# Patient Record
Sex: Female | Born: 1935 | Race: White | Hispanic: No | Marital: Married | Smoking: Never smoker
Health system: Southern US, Community
[De-identification: ages and names within clinical notes are randomized; demographics above are authoritative.]

## PROBLEM LIST (undated history)

## (undated) DIAGNOSIS — Z789 Other specified health status: Secondary | ICD-10-CM

## (undated) DIAGNOSIS — T8859XA Other complications of anesthesia, initial encounter: Secondary | ICD-10-CM

## (undated) DIAGNOSIS — C801 Malignant (primary) neoplasm, unspecified: Secondary | ICD-10-CM

## (undated) DIAGNOSIS — T4145XA Adverse effect of unspecified anesthetic, initial encounter: Secondary | ICD-10-CM

## (undated) DIAGNOSIS — I959 Hypotension, unspecified: Secondary | ICD-10-CM

## (undated) HISTORY — PX: EYE SURGERY: SHX253

## (undated) HISTORY — PX: COLONOSCOPY: SHX174

## (undated) HISTORY — DX: Hypotension, unspecified: I95.9

---

## 2005-09-18 ENCOUNTER — Ambulatory Visit: Payer: Self-pay | Admitting: Internal Medicine

## 2005-12-01 ENCOUNTER — Ambulatory Visit: Payer: Self-pay | Admitting: Gastroenterology

## 2006-11-30 ENCOUNTER — Ambulatory Visit: Payer: Self-pay | Admitting: Internal Medicine

## 2008-01-10 ENCOUNTER — Ambulatory Visit: Payer: Self-pay | Admitting: Internal Medicine

## 2009-01-07 ENCOUNTER — Ambulatory Visit: Payer: Self-pay | Admitting: Gastroenterology

## 2009-03-05 ENCOUNTER — Ambulatory Visit: Payer: Self-pay | Admitting: Internal Medicine

## 2010-03-06 ENCOUNTER — Ambulatory Visit: Payer: Self-pay | Admitting: Internal Medicine

## 2010-04-28 ENCOUNTER — Ambulatory Visit: Payer: Self-pay | Admitting: Gastroenterology

## 2010-04-29 ENCOUNTER — Ambulatory Visit: Payer: Self-pay | Admitting: Gastroenterology

## 2011-04-07 ENCOUNTER — Ambulatory Visit: Payer: Self-pay | Admitting: Internal Medicine

## 2012-05-18 ENCOUNTER — Ambulatory Visit: Payer: Self-pay | Admitting: Internal Medicine

## 2012-06-02 ENCOUNTER — Ambulatory Visit: Payer: Self-pay | Admitting: Obstetrics and Gynecology

## 2012-06-02 LAB — URINALYSIS, COMPLETE
Blood: NEGATIVE
Ketone: NEGATIVE
Nitrite: POSITIVE
Ph: 6 (ref 4.5–8.0)
Protein: 30
RBC,UR: 33 /HPF (ref 0–5)
WBC UR: 893 /HPF (ref 0–5)

## 2012-06-02 LAB — HEMOGLOBIN: HGB: 13.4 g/dL (ref 12.0–16.0)

## 2012-06-13 ENCOUNTER — Ambulatory Visit: Payer: Self-pay | Admitting: Obstetrics and Gynecology

## 2012-06-14 LAB — HEMATOCRIT: HCT: 36.7 % (ref 35.0–47.0)

## 2012-06-15 LAB — PATHOLOGY REPORT

## 2013-05-19 ENCOUNTER — Ambulatory Visit: Payer: Self-pay | Admitting: Internal Medicine

## 2014-06-06 ENCOUNTER — Encounter: Payer: Self-pay | Admitting: *Deleted

## 2014-06-07 ENCOUNTER — Ambulatory Visit: Payer: Self-pay | Admitting: Internal Medicine

## 2014-06-20 ENCOUNTER — Ambulatory Visit: Payer: Self-pay | Admitting: Ophthalmology

## 2014-07-03 ENCOUNTER — Ambulatory Visit: Payer: Self-pay | Admitting: Ophthalmology

## 2014-07-17 ENCOUNTER — Ambulatory Visit: Payer: Self-pay | Admitting: Ophthalmology

## 2015-01-01 NOTE — Op Note (Signed)
PATIENT NAME:  Katherine Medina, TIEDT MR#:  [id number] DATE OF BIRTH:  1929/05/23  DATE OF PROCEDURE:  06/13/2012  PREOPERATIVE DIAGNOSIS: Symptomatic pelvic organ prolapse.   POSTOPERATIVE DIAGNOSIS:  Symptomatic pelvic organ prolapse.   PROCEDURE: [place] colpocleisis.  SURGEON: Ricky L. Amalia Hailey, MD  ASSISTANT: Laverta Baltimore, MD  ANESTHESIA: General endotracheal.   FINDINGS: Pelvic organ prolapse, grossly normal tissue.   ESTIMATED BLOOD LOSS: 50 mL.   COMPLICATIONS: None.   DRAINS: Foley.   PROCEDURE IN DETAIL: The patient was placed in the supine position. Anesthesia was initiated. She was placed in the dorsal lithotomy position using Allen stirrups, prepped and draped in the usual sterile fashion. Cervix was visualized and grasped with a single-tooth tenaculum and everted. Sharpen curette was used to obtain minimal endometrial sampling, which was sent for permanent specimen.   Measurements were taken beginning with a portion of the epithelium to be removed toward the other vagina being measured to begin from 3 cm from the urethral orifice and then measured back to approximately 3 cm from the cervical orifice. This was repeated for total length of approximately 7.5 cm. This was reproduced posteriorly and marked. An approximately 3 cm gutter was allowed for marking along the sides once these trapezoidal areas were delineated the anterior vaginal epithelium was infused with approximately 15 mL of dilute vasopressin. This was completed in a similar fashion posterior. A scalpel was used to make a sharp incision next to the cervix anteriorly. Metzenbaum was used to bluntly dissect free the epithelium. This proceeded in a similar fashion posteriorly. A gutter was created with interrupted 3-0 Vicryl. Inversion of the cervix was begun by approximating the epithelium anterior and posteriorly. Colpocleisis was then carried out with successive mattress sutures of 3-0 Ethibond. The remaining portion of  the external vaginal epithelium was approximated with a running interlocking of 3-0 Vicryl. The gutters were seen to be maintained. Foley catheter was placed with good spillage of good clear urine.   Hemostasis was good. The patient tolerated the procedure well. After instrument, needle and counts were correct. Foley catheter will be left in place to plan to discontinue in the morning and upon spontaneous voiding will be discharged home with routine precautions and prescriptions.  ___________________________ Rockey Situ. Amalia Hailey, MD rle:slb D: 06/13/2012 13:16:08 ET T: 06/13/2012 13:38:27 ET JOB#: [id number]  cc: Ricky L. Amalia Hailey, MD, <Dictator> Selmer Dominion MD ELECTRONICALLY SIGNED 06/13/2012 15:05

## 2015-01-05 NOTE — Op Note (Signed)
PATIENT NAME:  Katherine Medina, Katherine Medina MR#:  [id number] DATE OF BIRTH:  1929-03-02  DATE OF PROCEDURE:  07/17/2014  PREOPERATIVE DIAGNOSIS:  Nuclear sclerotic cataract of the left eye.   POSTOPERATIVE DIAGNOSIS:  Nuclear sclerotic cataract of the left eye.   OPERATIVE PROCEDURE:  Cataract extraction by phacoemulsification with implant of intraocular lens to left eye.   SURGEON:  Birder Robson, MD.   ANESTHESIA:  1. Managed anesthesia care.  2. Topical tetracaine drops followed by 2% Xylocaine jelly applied in the preoperative holding area.   COMPLICATIONS:  None.   TECHNIQUE:   Stop and chop.   DESCRIPTION OF PROCEDURE:  The patient was examined and consented in the preoperative holding area where the aforementioned topical anesthesia was applied to the left eye and then brought back to the Operating Room where the left eye was prepped and draped in the usual sterile ophthalmic fashion and a lid speculum was placed. A paracentesis was created with the side port blade and the anterior chamber was filled with viscoelastic. A near clear corneal incision was performed with the steel keratome. A continuous curvilinear capsulorrhexis was performed with a cystotome followed by the capsulorrhexis forceps. Hydrodissection and hydrodelineation were carried out with BSS on a blunt cannula. The lens was removed in a stop and chop  technique and the remaining cortical material was removed with the irrigation-aspiration handpiece. The capsular bag was inflated with viscoelastic and the Tecnis ZCB00 25.5-diopter lens, serial number [id number] was placed in the capsular bag without complication. The remaining viscoelastic was removed from the eye with the irrigation-aspiration handpiece. The wounds were hydrated. The anterior chamber was flushed with Miostat and the eye was inflated to physiologic pressure. Vigamox 3:1 dilution was placed in the anterior chamber. The wounds were found to be water tight. The eye was  dressed with Vigamox. The patient was given protective glasses to wear throughout the day and a shield with which to sleep tonight. The patient was also given drops with which to begin a drop regimen today and will follow-up with me in one day.   PLEASE NOTE:  Cefuroxime was not placed within the eye due to penicillin allergy; rather, a 3:1 dilution of Vigamox was placed.     ____________________________ Livingston Diones. Tashayla Therien, MD wlp:jp D: 07/17/2014 21:12:39 ET T: 07/18/2014 08:38:38 ET JOB#: [id number]  cc: Manish Ruggiero L. Tomia Enlow, MD, <Dictator> Livingston Diones Brodric Schauer MD ELECTRONICALLY SIGNED 07/19/2014 16:57

## 2015-01-05 NOTE — Op Note (Signed)
PATIENT NAME:  Katherine Medina, Katherine Medina MR#:  [id number] DATE OF BIRTH:  [birth year]  DATE OF PROCEDURE:  07/03/2014  PREOPERATIVE DIAGNOSIS:  Visually significant cataract of the right eye.   POSTOPERATIVE DIAGNOSIS:  Visually significant cataract of the right eye.   OPERATIVE PROCEDURE:  Cataract extraction by phacoemulsification with implant of intraocular lens to right eye.   SURGEON:  Livingston Diones. Alfonzo Arca, MD   ANESTHESIA:  1. Managed anesthesia care.  2. Topical tetracaine drops followed by 2% Xylocaine jelly applied in the preoperative holding area.   COMPLICATIONS:  None.   TECHNIQUE:  Stop and chop.   DESCRIPTION OF PROCEDURE:  The patient was examined and consented in the preoperative holding area where the aforementioned topical anesthesia was applied to the right eye and then brought back to the Operating Room where the right eye was prepped and draped in the usual sterile ophthalmic fashion and a lid speculum was placed. A paracentesis was created with the side port blade and the anterior chamber was filled with viscoelastic. A near clear corneal incision was performed with the steel keratome. A continuous curvilinear capsulorrhexis was performed with a cystotome followed by the capsulorrhexis forceps. Hydrodissection and hydrodelineation were carried out with BSS on a blunt cannula. The lens was removed in a stop and chop technique and the remaining cortical material was removed with the irrigation-aspiration handpiece. The capsular bag was inflated with viscoelastic and the Tecnis ZCB00, 25.0-diopter lens, serial number [id number], was placed in the capsular bag without complication. The remaining viscoelastic was removed from the eye with the irrigation-aspiration handpiece. The wounds were hydrated. The anterior chamber was flushed with Miostat and the eye was inflated to physiologic pressure. Please note, cefuroxime was not used within the eye due to penicillin allergy; rather, a 4:1  dilution of Vigamox was placed in the anterior chamber. The wounds were found to be water tight. The eye was dressed with Vigamox. The patient was given protective glasses to wear throughout the day and a shield with which to sleep tonight. The patient was also given drops with which to begin a drop regimen today and will follow up with me in one day.   Also, please note that at the beginning of the case, 0.1 mL of epi-Shugarcaine was instilled into the anterior chamber due to poor dilation.    ____________________________ Livingston Diones Shakhia Gramajo, MD wlp:nb D: 07/03/2014 21:29:36 ET T: 07/04/2014 00:33:36 ET JOB#: [id number]  cc: Mavric Cortright L. Niels Cranshaw, MD, <Dictator> Livingston Diones Akaya Proffit MD ELECTRONICALLY SIGNED 07/05/2014 8:55

## 2016-03-31 ENCOUNTER — Encounter: Payer: Self-pay | Admitting: Emergency Medicine

## 2016-03-31 ENCOUNTER — Inpatient Hospital Stay
Admission: EM | Admit: 2016-03-31 | Discharge: 2016-04-03 | DRG: 337 | Disposition: A | Discharge status: Home / Self Care | Payer: Medicare Other | Attending: Surgery | Admitting: Surgery

## 2016-03-31 ENCOUNTER — Emergency Department: Payer: Medicare Other

## 2016-03-31 DIAGNOSIS — K353 Acute appendicitis with localized peritonitis, without perforation or gangrene: Secondary | ICD-10-CM

## 2016-03-31 DIAGNOSIS — K660 Peritoneal adhesions (postprocedural) (postinfection): Secondary | ICD-10-CM | POA: Diagnosis present

## 2016-03-31 DIAGNOSIS — K3532 Acute appendicitis with perforation and localized peritonitis, without abscess: Secondary | ICD-10-CM | POA: Diagnosis present

## 2016-03-31 LAB — CBC
HCT: 42.8 % (ref 35.0–47.0)
Hemoglobin: 14.2 g/dL (ref 12.0–16.0)
MCH: 28 pg (ref 26.0–34.0)
MCHC: 33.2 g/dL (ref 32.0–36.0)
MCV: 84.5 fL (ref 80.0–100.0)
PLATELETS: 204 10*3/uL (ref 150–440)
RBC: 5.07 MIL/uL (ref 3.80–5.20)
RDW: 14.2 % (ref 11.5–14.5)
WBC: 11.1 10*3/uL — AB (ref 3.6–11.0)

## 2016-03-31 LAB — COMPREHENSIVE METABOLIC PANEL
ALT: 13 U/L — AB (ref 14–54)
AST: 23 U/L (ref 15–41)
Albumin: 3.9 g/dL (ref 3.5–5.0)
Alkaline Phosphatase: 56 U/L (ref 38–126)
Anion gap: 8 (ref 5–15)
BILIRUBIN TOTAL: 0.9 mg/dL (ref 0.3–1.2)
BUN: 23 mg/dL — AB (ref 6–20)
CO2: 26 mmol/L (ref 22–32)
CREATININE: 0.88 mg/dL (ref 0.44–1.00)
Calcium: 8.7 mg/dL — ABNORMAL LOW (ref 8.9–10.3)
Chloride: 101 mmol/L (ref 101–111)
GFR, EST NON AFRICAN AMERICAN: 58 mL/min — AB (ref >60)
Glucose, Bld: 177 mg/dL — ABNORMAL HIGH (ref 65–99)
Potassium: 3.8 mmol/L (ref 3.5–5.1)
Sodium: 135 mmol/L (ref 135–145)
TOTAL PROTEIN: 7.7 g/dL (ref 6.5–8.1)

## 2016-03-31 LAB — LIPASE, BLOOD: LIPASE: 29 U/L (ref 11–51)

## 2016-03-31 MED ORDER — ONDANSETRON HCL 4 MG/2ML IJ SOLN
4.0000 mg | Freq: Once | INTRAMUSCULAR | Status: AC
  Administered 2016-03-31: 4 mg via INTRAVENOUS
  Filled 2016-03-31: qty 2

## 2016-03-31 MED ORDER — MORPHINE SULFATE (PF) 4 MG/ML IV SOLN
4.0000 mg | Freq: Once | INTRAVENOUS | Status: AC
  Administered 2016-03-31: 4 mg via INTRAVENOUS
  Filled 2016-03-31: qty 1

## 2016-03-31 MED ORDER — SODIUM CHLORIDE 0.9 % IV BOLUS (SEPSIS)
1000.0000 mL | Freq: Once | INTRAVENOUS | Status: AC
  Administered 2016-03-31: 1000 mL via INTRAVENOUS

## 2016-03-31 MED ORDER — DIATRIZOATE MEGLUMINE & SODIUM 66-10 % PO SOLN
15.0000 mL | Freq: Once | ORAL | Status: AC
  Administered 2016-03-31: 15 mL via ORAL

## 2016-03-31 NOTE — ED Notes (Signed)
Patient returned from XR. 

## 2016-03-31 NOTE — ED Notes (Signed)
Patient at XR

## 2016-03-31 NOTE — ED Provider Notes (Signed)
[facility] Emergency Department Provider Note   ____________________________________________  Time seen: Approximately 11:24 PM  I have reviewed the triage vital signs and the nursing notes.   HISTORY  Chief Complaint Abdominal Pain and Constipation    HPI Katherine Medina is a 82 y.o. female who comes into the hospital today with some abdominal pain.The patient reports that this pain started last Saturday. She reports that she thought it would get better but it has been getting progressively worse. The patient has been unable to have a bowel movement. She reports that she has been drinking water and apple juice to get things going but has not helped. She reports that she has not taken any medications to produce a bowel movement as she was unsure what this was. The patient did have some constipation a few months ago but reports it resolved with application water. The patient reports that her last normal bowel movement was last week Friday. She denies any vomiting but reports that the pain is constant in the lower abdomen and going up to the right side. The patient rates her pain a 10 out of 10 in intensity. She reports that she's been able to eat today without any vomiting but she has not urinated much today. The patient is very uncomfortable and came into the hospital for evaluation.   History reviewed. No pertinent past medical history.  There are no active problems to display for this patient.   Past Surgical History  Procedure Laterality Date  . Cesarean section      Current Outpatient Rx  Name  Route  Sig  Dispense  Refill  . calcium carbonate (OS-CAL - DOSED IN MG OF ELEMENTAL CALCIUM) 1250 (500 Ca) MG tablet   Oral   Take 1 tablet by mouth daily.           Allergies Review of patient's allergies indicates no known allergies.  History reviewed. No pertinent family history.  Social History Social History  Substance Use Topics  . Smoking  status: Never Smoker   . Smokeless tobacco: None  . Alcohol Use: No    Review of Systems Constitutional: No fever/chills Eyes: No visual changes. ENT: No sore throat. Cardiovascular: Denies chest pain. Respiratory: Denies shortness of breath. Gastrointestinal:  abdominal pain and constipation. Genitourinary: Negative for dysuria. Musculoskeletal: Negative for back pain. Skin: Negative for rash. Neurological: Negative for headaches, focal weakness or numbness.  10-point ROS otherwise negative.  ____________________________________________   PHYSICAL EXAM:  VITAL SIGNS: ED Triage Vitals  Enc Vitals Group     BP 03/31/16 2114 158/70 mmHg     Pulse Rate 03/31/16 2114 111     Resp 03/31/16 2114 20     Temp 03/31/16 2114 98.2 F (36.8 C)     Temp Source 03/31/16 2114 Oral     SpO2 03/31/16 2114 96 %     Weight 03/31/16 2114 146 lb (66.225 kg)     Height 03/31/16 2114 5\' 4"  (1.626 m)     Head Cir --      Peak Flow --      Pain Score 03/31/16 2115 10     Pain Loc --      Pain Edu? --      Excl. in [place]? --     Constitutional: Alert and oriented. Well appearing and in Moderate distress. Eyes: Conjunctivae are normal. PERRL. EOMI. Head: Atraumatic. Nose: No congestion/rhinnorhea. Mouth/Throat: Mucous membranes are moist.  Oropharynx non-erythematous. Cardiovascular: Normal rate, regular  rhythm. Grossly normal heart sounds.  Good peripheral circulation. Respiratory: Normal respiratory effort.  No retractions. Lungs CTAB. Gastrointestinal: Soft and tender in the lower abdomen worse on the right side. No distention. Positive bowel sounds Musculoskeletal: No lower extremity tenderness nor edema.  . Neurologic:  Normal speech and language.  Skin:  Skin is warm, dry and intact.  Psychiatric: Mood and affect are normal.   ____________________________________________   LABS (all labs ordered are listed, but only abnormal results are displayed)  Labs Reviewed  COMPREHENSIVE  METABOLIC PANEL - Abnormal; Notable for the following:    Glucose, Bld 177 (*)    BUN 23 (*)    Calcium 8.7 (*)    ALT 13 (*)    GFR calc non Af Amer 58 (*)    All other components within normal limits  CBC - Abnormal; Notable for the following:    WBC 11.1 (*)    All other components within normal limits  LIPASE, BLOOD  LACTIC ACID, PLASMA  URINALYSIS COMPLETEWITH MICROSCOPIC ([facility] ONLY)   ____________________________________________  EKG  None ____________________________________________  RADIOLOGY  Abdomen 1 view: Mild to moderate stool volume without signs of obstruction or impaction.  CT abdomen and pelvis: Acute appendicitis with perforated tip, small nonloculated pelvic fluid no abscess, abnormally thickened endometrium recommend pelvic sonography after convalescence, had a hernia containing distal stomach. ____________________________________________   PROCEDURES  Procedure(s) performed: None  Procedures  Critical Care performed: No  ____________________________________________   INITIAL IMPRESSION / ASSESSMENT AND PLAN / ED COURSE  Pertinent labs & imaging results that were available during my care of the patient were reviewed by me and considered in my medical decision making (see chart for details).  This is an 82 year old female who comes into the hospital today with some abdominal pain. The patient is been having this pain for multiple days into a week. She reports that she also has not been able to have a bowel movement. The patient is in some significant pains all give her a liter of normal saline and I'll give her some morphine and Zofran for her pain. The patient had an x-ray that showed some stool burden but I will do a CT scan to evaluate the patient's abdomen more thoroughly.  I contacted Dr. Burt Knack who was the surgeon on-call currently. He will come down to see the patient and evaluate her for surgery. The patient will be admitted to the surgical  service. ____________________________________________   FINAL CLINICAL IMPRESSION(S) / ED DIAGNOSES  Final diagnoses:  Acute appendicitis with localized peritonitis      NEW MEDICATIONS STARTED DURING THIS VISIT:  New Prescriptions   No medications on file     Note:  This document was prepared using Dragon voice recognition software and may include unintentional dictation errors.    Loney Hering, MD 04/01/16 [phone]

## 2016-03-31 NOTE — ED Notes (Signed)
Pt arrived to the ED accompanied by her husband for lower abdominal pain and constipation. Pt reports that she has been experiencing abdominal pain constipation for the last 4 days without relieve. Pt is AOx4 in moderate pain distress .

## 2016-04-01 ENCOUNTER — Encounter
Admission: EM | Disposition: A | Discharge status: Home / Self Care | Payer: Self-pay | Source: Home / Self Care | Attending: Surgery

## 2016-04-01 ENCOUNTER — Emergency Department: Payer: Medicare Other

## 2016-04-01 ENCOUNTER — Encounter: Payer: Self-pay | Admitting: Radiology

## 2016-04-01 ENCOUNTER — Inpatient Hospital Stay: Payer: Medicare Other | Admitting: Anesthesiology

## 2016-04-01 DIAGNOSIS — K352 Acute appendicitis with generalized peritonitis: Secondary | ICD-10-CM

## 2016-04-01 DIAGNOSIS — K353 Acute appendicitis with localized peritonitis: Secondary | ICD-10-CM | POA: Diagnosis present

## 2016-04-01 DIAGNOSIS — K3532 Acute appendicitis with perforation and localized peritonitis, without abscess: Secondary | ICD-10-CM | POA: Diagnosis present

## 2016-04-01 DIAGNOSIS — K660 Peritoneal adhesions (postprocedural) (postinfection): Secondary | ICD-10-CM | POA: Diagnosis present

## 2016-04-01 HISTORY — PX: LAPAROSCOPIC APPENDECTOMY: SHX408

## 2016-04-01 LAB — URINALYSIS COMPLETE WITH MICROSCOPIC (ARMC ONLY)
Bilirubin Urine: NEGATIVE
GLUCOSE, UA: NEGATIVE mg/dL
HGB URINE DIPSTICK: NEGATIVE
NITRITE: NEGATIVE
Protein, ur: NEGATIVE mg/dL
SPECIFIC GRAVITY, URINE: 1.06 — AB (ref 1.005–1.030)
pH: 5 (ref 5.0–8.0)

## 2016-04-01 LAB — SURGICAL PCR SCREEN
MRSA, PCR: NEGATIVE
Staphylococcus aureus: NEGATIVE

## 2016-04-01 LAB — LACTIC ACID, PLASMA: LACTIC ACID, VENOUS: 1.9 mmol/L (ref 0.5–1.9)

## 2016-04-01 SURGERY — APPENDECTOMY, LAPAROSCOPIC
Anesthesia: General | Wound class: Dirty or Infected

## 2016-04-01 MED ORDER — PIPERACILLIN-TAZOBACTAM 3.375 G IVPB
3.3750 g | Freq: Three times a day (TID) | INTRAVENOUS | Status: DC
  Administered 2016-04-01 – 2016-04-03 (×6): 3.375 g via INTRAVENOUS
  Filled 2016-04-01 (×10): qty 50

## 2016-04-01 MED ORDER — ACETAMINOPHEN 10 MG/ML IV SOLN
INTRAVENOUS | Status: AC
  Filled 2016-04-01: qty 100

## 2016-04-01 MED ORDER — EPHEDRINE SULFATE 50 MG/ML IJ SOLN
INTRAMUSCULAR | Status: DC | PRN
  Administered 2016-04-01: 5 mg via INTRAVENOUS

## 2016-04-01 MED ORDER — PROMETHAZINE HCL 25 MG/ML IJ SOLN: 6.2500 mg | INTRAMUSCULAR | Status: DC | PRN

## 2016-04-01 MED ORDER — SUGAMMADEX SODIUM 200 MG/2ML IV SOLN
INTRAVENOUS | Status: DC | PRN
  Administered 2016-04-01: 132.4 mg via INTRAVENOUS

## 2016-04-01 MED ORDER — PHENYLEPHRINE HCL 10 MG/ML IJ SOLN
INTRAMUSCULAR | Status: DC | PRN
  Administered 2016-04-01 (×3): 100 ug via INTRAVENOUS

## 2016-04-01 MED ORDER — DEXAMETHASONE SODIUM PHOSPHATE 10 MG/ML IJ SOLN
INTRAMUSCULAR | Status: DC | PRN
  Administered 2016-04-01: 10 mg via INTRAVENOUS

## 2016-04-01 MED ORDER — LIDOCAINE 2% (20 MG/ML) 5 ML SYRINGE
INTRAMUSCULAR | Status: DC | PRN
  Administered 2016-04-01: 80 mg via INTRAVENOUS

## 2016-04-01 MED ORDER — ACETAMINOPHEN 10 MG/ML IV SOLN
INTRAVENOUS | Status: DC | PRN
  Administered 2016-04-01: 1000 mg via INTRAVENOUS

## 2016-04-01 MED ORDER — BUPIVACAINE-EPINEPHRINE (PF) 0.25% -1:200000 IJ SOLN
INTRAMUSCULAR | Status: AC
  Filled 2016-04-01: qty 30

## 2016-04-01 MED ORDER — ONDANSETRON HCL 4 MG/2ML IJ SOLN
4.0000 mg | Freq: Four times a day (QID) | INTRAMUSCULAR | Status: DC | PRN
  Administered 2016-04-01: 4 mg via INTRAVENOUS

## 2016-04-01 MED ORDER — FENTANYL CITRATE (PF) 100 MCG/2ML IJ SOLN: 25.0000 ug | INTRAMUSCULAR | Status: DC | PRN

## 2016-04-01 MED ORDER — SODIUM CHLORIDE 0.9 % IV BOLUS (SEPSIS)
500.0000 mL | Freq: Once | INTRAVENOUS | Status: AC
  Administered 2016-04-01: 500 mL via INTRAVENOUS

## 2016-04-01 MED ORDER — LACTATED RINGERS IV BOLUS (SEPSIS)
500.0000 mL | Freq: Once | INTRAVENOUS | Status: AC
  Administered 2016-04-01: 500 mL via INTRAVENOUS

## 2016-04-01 MED ORDER — PIPERACILLIN-TAZOBACTAM 3.375 G IVPB
INTRAVENOUS | Status: AC
  Administered 2016-04-01: 14:00:00
  Filled 2016-04-01: qty 50

## 2016-04-01 MED ORDER — KETOROLAC TROMETHAMINE 30 MG/ML IJ SOLN
INTRAMUSCULAR | Status: DC | PRN
  Administered 2016-04-01: 15 mg via INTRAVENOUS

## 2016-04-01 MED ORDER — BUPIVACAINE-EPINEPHRINE 0.25% -1:200000 IJ SOLN
INTRAMUSCULAR | Status: DC | PRN
  Administered 2016-04-01: 30 mL

## 2016-04-01 MED ORDER — MORPHINE SULFATE (PF) 4 MG/ML IV SOLN
4.0000 mg | Freq: Once | INTRAVENOUS | Status: AC
  Administered 2016-04-01: 4 mg via INTRAVENOUS
  Filled 2016-04-01: qty 1

## 2016-04-01 MED ORDER — ONDANSETRON HCL 4 MG PO TABS
4.0000 mg | ORAL_TABLET | Freq: Four times a day (QID) | ORAL | Status: DC | PRN
Start: 2016-04-01 — End: 2016-04-03

## 2016-04-01 MED ORDER — OXYCODONE HCL 5 MG PO TABS: 5.0000 mg | ORAL_TABLET | Freq: Once | ORAL | Status: DC | PRN

## 2016-04-01 MED ORDER — FENTANYL CITRATE (PF) 100 MCG/2ML IJ SOLN
INTRAMUSCULAR | Status: DC | PRN
  Administered 2016-04-01: 100 ug via INTRAVENOUS

## 2016-04-01 MED ORDER — LACTATED RINGERS IV SOLN
INTRAVENOUS | Status: DC | PRN
  Administered 2016-04-01: 13:00:00 via INTRAVENOUS

## 2016-04-01 MED ORDER — MORPHINE SULFATE (PF) 2 MG/ML IV SOLN
2.0000 mg | Freq: Once | INTRAVENOUS | Status: AC
  Administered 2016-04-01: 2 mg via INTRAVENOUS

## 2016-04-01 MED ORDER — KCL IN DEXTROSE-NACL 10-5-0.45 MEQ/L-%-% IV SOLN
INTRAVENOUS | Status: DC
  Administered 2016-04-01 – 2016-04-02 (×2): via INTRAVENOUS
  Filled 2016-04-01 (×7): qty 1000

## 2016-04-01 MED ORDER — OXYCODONE-ACETAMINOPHEN 5-325 MG PO TABS
1.0000 | ORAL_TABLET | ORAL | Status: DC | PRN
  Administered 2016-04-01 – 2016-04-02 (×3): 1 via ORAL
  Filled 2016-04-01 (×3): qty 1

## 2016-04-01 MED ORDER — PROPOFOL 10 MG/ML IV BOLUS
INTRAVENOUS | Status: DC | PRN
  Administered 2016-04-01: 80 mg via INTRAVENOUS

## 2016-04-01 MED ORDER — MORPHINE SULFATE (PF) 2 MG/ML IV SOLN
2.0000 mg | INTRAVENOUS | Status: DC | PRN
  Administered 2016-04-01: 2 mg via INTRAVENOUS
  Filled 2016-04-01: qty 1

## 2016-04-01 MED ORDER — HEPARIN SODIUM (PORCINE) 5000 UNIT/ML IJ SOLN
5000.0000 [IU] | Freq: Three times a day (TID) | INTRAMUSCULAR | Status: DC
  Administered 2016-04-01 – 2016-04-03 (×6): 5000 [IU] via SUBCUTANEOUS
  Filled 2016-04-01 (×6): qty 1

## 2016-04-01 MED ORDER — MORPHINE SULFATE (PF) 2 MG/ML IV SOLN
INTRAVENOUS | Status: AC
  Administered 2016-04-01: 2 mg via INTRAVENOUS
  Filled 2016-04-01: qty 1

## 2016-04-01 MED ORDER — ROCURONIUM BROMIDE 100 MG/10ML IV SOLN
INTRAVENOUS | Status: DC | PRN
  Administered 2016-04-01: 5 mg via INTRAVENOUS
  Administered 2016-04-01: 10 mg via INTRAVENOUS

## 2016-04-01 MED ORDER — SODIUM CHLORIDE FLUSH 0.9 % IV SOLN
INTRAVENOUS | Status: AC
  Administered 2016-04-01: 11:00:00
  Filled 2016-04-01: qty 10

## 2016-04-01 MED ORDER — OXYCODONE HCL 5 MG/5ML PO SOLN: 5.0000 mg | Freq: Once | ORAL | Status: DC | PRN

## 2016-04-01 MED ORDER — IOPAMIDOL (ISOVUE-300) INJECTION 61%
100.0000 mL | Freq: Once | INTRAVENOUS | Status: AC | PRN
  Administered 2016-04-01: 100 mL via INTRAVENOUS

## 2016-04-01 MED ORDER — SUCCINYLCHOLINE CHLORIDE 20 MG/ML IJ SOLN
INTRAMUSCULAR | Status: DC | PRN
  Administered 2016-04-01: 80 mg via INTRAVENOUS

## 2016-04-01 MED ORDER — MEPERIDINE HCL 25 MG/ML IJ SOLN: 6.2500 mg | INTRAMUSCULAR | Status: DC | PRN

## 2016-04-01 SURGICAL SUPPLY — 36 items
APPLIER CLIP 5 13 M/L LIGAMAX5 (MISCELLANEOUS)
BLADE CLIPPER SURG (BLADE) ×3 IMPLANT
BULB RESERV EVAC DRAIN JP 100C (MISCELLANEOUS) ×3 IMPLANT
CANISTER SUCT 1200ML W/VALVE (MISCELLANEOUS) ×3 IMPLANT
CHLORAPREP W/TINT 26ML (MISCELLANEOUS) ×3 IMPLANT
CLIP APPLIE 5 13 M/L LIGAMAX5 (MISCELLANEOUS) IMPLANT
CUTTER FLEX LINEAR 45M (STAPLE) ×3 IMPLANT
DRAIN CHANNEL JP 15F RND 16 (MISCELLANEOUS) ×3 IMPLANT
ELECT REM PT RETURN 9FT ADLT (ELECTROSURGICAL) ×3
ELECTRODE REM PT RTRN 9FT ADLT (ELECTROSURGICAL) ×1 IMPLANT
ENDOPOUCH RETRIEVER 10 (MISCELLANEOUS) ×3 IMPLANT
GLOVE BIO SURGEON STRL SZ7 (GLOVE) ×3 IMPLANT
GOWN STRL REUS W/ TWL LRG LVL3 (GOWN DISPOSABLE) ×2 IMPLANT
GOWN STRL REUS W/TWL LRG LVL3 (GOWN DISPOSABLE) ×4
IRRIGATION STRYKERFLOW (MISCELLANEOUS) ×1 IMPLANT
IRRIGATOR STRYKERFLOW (MISCELLANEOUS) ×3
IV SOD CHL 0.9% 1000ML (IV SOLUTION) ×3 IMPLANT
LIQUID BAND (GAUZE/BANDAGES/DRESSINGS) ×3 IMPLANT
NEEDLE HYPO 25X1 1.5 SAFETY (NEEDLE) ×3 IMPLANT
NS IRRIG 500ML POUR BTL (IV SOLUTION) ×3 IMPLANT
PACK LAP CHOLECYSTECTOMY (MISCELLANEOUS) ×3 IMPLANT
PENCIL ELECTRO HAND CTR (MISCELLANEOUS) ×3 IMPLANT
RELOAD 45 VASCULAR/THIN (ENDOMECHANICALS) ×3 IMPLANT
RELOAD STAPLE TA45 3.5 REG BLU (ENDOMECHANICALS) ×3 IMPLANT
SCALPEL HARMONIC ACE (MISCELLANEOUS) ×3 IMPLANT
SCISSORS METZENBAUM CVD 33 (INSTRUMENTS) IMPLANT
SLEEVE ENDOPATH XCEL 5M (ENDOMECHANICALS) ×3 IMPLANT
SUT MNCRL AB 4-0 PS2 18 (SUTURE) ×3 IMPLANT
SUT VICRYL 0 AB UR-6 (SUTURE) ×6 IMPLANT
SYR 20CC LL (SYRINGE) ×3 IMPLANT
TRAY FOLEY W/METER SILVER 16FR (SET/KITS/TRAYS/PACK) ×3 IMPLANT
TROCAR XCEL BLUNT TIP 100MML (ENDOMECHANICALS) ×3 IMPLANT
TROCAR XCEL NON-BLD 5MMX100MML (ENDOMECHANICALS) ×6 IMPLANT
TUBING CONNECTING 10 (TUBING) ×2 IMPLANT
TUBING CONNECTING 10' (TUBING) ×1
TUBING INSUF HEATED (TUBING) ×3 IMPLANT

## 2016-04-01 NOTE — OR Nursing (Signed)
Patient with c/o pain to the right lower quadrant of abdomen.  Patient reports 7/10 pain.  Order received from anesthesia to administer morphine 2 mg; which was given per protocol.  Will continue to monitor the patient.  Call bed at bedside, as well as family.

## 2016-04-01 NOTE — Op Note (Addendum)
PROCEDURES Laparoscopic lysis of adhesions taking at least 30 minutes laparascopic appendectomy  Basement of a #15 Blake drain  Daphene Calamity Date of operation:  04/01/2016  Indications: The patient presented with a history of  abdominal pain. Workup has revealed findings consistent with acute appendicitis.  Pre-operative Diagnosis: Acute appendicitis with generalized peritonitis  Post-operative Diagnosis: Same  Surgeon: Caroleen Hamman, MD, FACS  Anesthesia: General with endotracheal tube  Findings:  Perforated appendicitis with some purulent fluid  Extensive adhesions from the small bowel to the abdominal wall and also from the sigmoid to the appendix and cecum.  Estimated Blood Loss: 10cc         Specimens: appendix         Complications:  none  Procedure Details  The patient was seen again in the preop area. The options of surgery versus observation were reviewed with the patient and/or family. The risks of bleeding, infection, recurrence of symptoms, negative laparoscopy, potential for an open procedure, bowel injury, abscess or infection, were all reviewed as well. The patient was taken to Operating Room, identified as Katherine Medina and the procedure verified as laparoscopic appendectomy. A Time Out was held and the above information confirmed.  The patient was placed in the supine position and general anesthesia was induced.  Antibiotic prophylaxis was administered and VT E prophylaxis was in place. A Foley catheter was placed by the nursing staff.   The abdomen was prepped and draped in a sterile fashion. An infraumbilical incision was made. A cutdown technique was used to enter the abdominal cavity. Two vicryl stitches were placed on the fascia and a Hasson trocar inserted. Pneumoperitoneum obtained. Two 5 mm ports were placed under direct visualization. Extensive adhesions were encountered. The small bowel was attached to the right lower quadrant and also the sigmoid was  attached to the mesial appendix. Last of adhesions was performed laparoscopically with a combination of the suction device and scissors. We were very careful not to injure any of the intra-abdominal structures. Once we have an adequate exposure and we started dissection. Please note that the last of adhesions took at least half an hour of total operative time.  The appendix was identified and found to be acutely inflamed  The appendix was carefully dissected. The base of the appendix was dissected out and divided with a standard load Endo GIA. The mesoappendix was divided withHarmonic scalpel. The appendix was passed out through the left lateral port site with the aid of an Endo Catch bag. He is not appendix is perforated and it was in 2 pieces. The right lower quadrant and pelvis was then irrigated with copious amounts of normal saline which was aspirated. Inspection  failed to identify any additional bleeding and there were no signs of bowel injury. #15 Blake drain was placed along the pelvis and right lower quadrant  The periumbilical fascia was closed with interrupted 0 Vicryl sutures. Again the right lower quadrant was inspected there was no sign of bleeding or bowel injury therefore pneumoperitoneum was released, all ports were removed and the skin incisions were approximated with subcuticular 4-0 Monocryl. Dermabond was placed.  The patient tolerated the procedure well, there were no complications. The sponge lap and needle count were correct at the end of the procedure.  The patient was taken to the recovery room in stable condition to be admitted for continued care.    Caroleen Hamman, MD FACS

## 2016-04-01 NOTE — ED Notes (Signed)
Patient has finished oral contrast, called CT to come get her.

## 2016-04-01 NOTE — Progress Notes (Signed)
Preoperative Review   Patient is met in the preoperative holding area. The history is reviewed in the chart and with the patient. I personally reviewed the options and rationale as well as the risks of this procedure that have been previously discussed with the patient. All questions asked by the patient and/or family were answered to their satisfaction. They had some concerns about anesthetic complications, I would defer to the anesthesiology team, if for some reason we won't be able to provide w GETA another alternative ( inferior) would be Antibiotics rx and interval per drain.  D/W them in detail I do think she needs an appendectomy.  Patient agrees to proceed with this procedure at this time.  Caroleen Hamman M.D. FACS

## 2016-04-01 NOTE — Anesthesia Procedure Notes (Signed)
Procedure Name: Intubation Date/Time: 04/01/2016 12:55 PM Performed by: Marsh Dolly Pre-anesthesia Checklist: Patient identified, Patient being monitored, Timeout performed, Emergency Drugs available and Suction available Patient Re-evaluated:Patient Re-evaluated prior to inductionOxygen Delivery Method: Circle system utilized Preoxygenation: Pre-oxygenation with 100% oxygen Intubation Type: IV induction Ventilation: Mask ventilation without difficulty Laryngoscope Size: 3 and Miller Grade View: Grade I Tube type: Oral Tube size: 7.5 mm Number of attempts: 1 Placement Confirmation: ETT inserted through vocal cords under direct vision,  positive ETCO2 and breath sounds checked- equal and bilateral Secured at: 21 cm Tube secured with: Tape Dental Injury: Teeth and Oropharynx as per pre-operative assessment

## 2016-04-01 NOTE — ED Notes (Signed)
Admitting MD at bedside.

## 2016-04-01 NOTE — Transfer of Care (Signed)
Immediate Anesthesia Transfer of Care Note  Patient: Katherine Medina  Procedure(s) Performed: Procedure(s): APPENDECTOMY LAPAROSCOPIC (N/A)  Patient Location: PACU  Anesthesia Type:General  Level of Consciousness: sedated  Airway & Oxygen Therapy: Patient Spontanous Breathing and Patient connected to face mask oxygen  Post-op Assessment: Report given to RN and Post -op Vital signs reviewed and stable  Post vital signs: Reviewed and stable  Last Vitals:  Filed Vitals:   04/01/16 0608 04/01/16 0800  BP: 123/49 121/39  Pulse: 84 85  Temp: 36.8 C 36.7 C  Resp: 20 16    Last Pain:  Filed Vitals:   04/01/16 0932  PainSc: 1          Complications: No apparent anesthesia complications

## 2016-04-01 NOTE — Progress Notes (Signed)
LR 100 ml IV fluids in pacu.

## 2016-04-01 NOTE — H&P (Signed)
Katherine Medina is an 82 y.o. female.    Chief Complaint: Abdominal pain  HPI: This is a thin 82 year old female patient with abdominal pain that started several days ago probably on Saturday possibly before. K most severe on Saturday. She's never had an episode like this before she's had chills but no fevers but has not taken her temperature at home. He is had no nausea or vomiting but has not been able to eat much and is not had much in the way of bowel movements for several days. She denies dysuria  Patient was seen in the emergency room by Dr. Dahlia Client was done a workup showing ruptured appendicitis  She takes no medications and denies any medical problems including stroke or heart attack or lung disease etc. She has had 2 C-sections and a bladder suspension  History reviewed. No pertinent past medical history.  Past Surgical History  Procedure Laterality Date  . Cesarean section      History reviewed. No pertinent family history. Social History:  reports that she has never smoked. She does not have any smokeless tobacco history on file. She reports that she does not drink alcohol or use illicit drugs.  Allergies: No Known Allergies   (Not in a hospital admission)   Review of Systems  Constitutional: Positive for chills and malaise/fatigue. Negative for fever and weight loss.  HENT: Negative.   Eyes: Negative.   Respiratory: Negative.   Cardiovascular: Negative.   Gastrointestinal: Positive for nausea, abdominal pain and constipation. Negative for heartburn, vomiting, diarrhea, blood in stool and melena.  Genitourinary: Negative.   Musculoskeletal: Negative.   Skin: Negative.   Neurological: Negative.   Endo/Heme/Allergies: Negative.   Psychiatric/Behavioral: Negative.      Physical Exam:  BP 122/74 mmHg  Pulse 105  Temp(Src) 98.2 F (36.8 C) (Oral)  Resp 18  Ht _0  (1.626 m)  Wt 146 lb (66.225 kg)  BMI 25.05 kg/m2  SpO2 93%  Physical Exam   Constitutional: She is oriented to person, place, and time and well-developed, well-nourished, and in no distress. No distress.  HENT:  Head: Normocephalic and atraumatic.  Dry mucous membranes  Eyes: Pupils are equal, round, and reactive to light. Right eye exhibits no discharge. Left eye exhibits no discharge. No scleral icterus.  Neck: Normal range of motion.  Cardiovascular: Normal rate, regular rhythm and normal heart sounds.   Pulmonary/Chest: Effort normal and breath sounds normal. No respiratory distress. She has no wheezes. She has no rales.  Abdominal: She exhibits no distension. There is tenderness. There is guarding. There is no rebound.  Maximal tenderness at McBurney's point and positive Rovsing sign some guarding and minimal percussion tenderness no rebound tenderness  Musculoskeletal: Normal range of motion. She exhibits no edema or tenderness.  Lymphadenopathy:    She has no cervical adenopathy.  Neurological: She is alert and oriented to person, place, and time.  Skin: Skin is warm and dry. No rash noted. She is not diaphoretic. No erythema.  Psychiatric: Mood and affect normal.  Vitals reviewed.       Results for orders placed or performed during the hospital encounter of 03/31/16 (from the past 48 hour(s))  Lipase, blood     Status: None   Collection Time: 03/31/16  9:17 PM  Result Value Ref Range   Lipase 29 11 - 51 U/L  Comprehensive metabolic panel     Status: Abnormal   Collection Time: 03/31/16  9:17 PM  Result Value Ref Range  Sodium 135 135 - 145 mmol/L   Potassium 3.8 3.5 - 5.1 mmol/L   Chloride 101 101 - 111 mmol/L   CO2 26 22 - 32 mmol/L   Glucose, Bld 177 (H) 65 - 99 mg/dL   BUN 23 (H) 6 - 20 mg/dL   Creatinine, Ser 0.88 0.44 - 1.00 mg/dL   Calcium 8.7 (L) 8.9 - 10.3 mg/dL   Total Protein 7.7 6.5 - 8.1 g/dL   Albumin 3.9 3.5 - 5.0 g/dL   AST 23 15 - 41 U/L   ALT 13 (L) 14 - 54 U/L   Alkaline Phosphatase 56 38 - 126 U/L   Total Bilirubin  0.9 0.3 - 1.2 mg/dL   GFR calc non Af Amer 58 (L) >60 mL/min   GFR calc Af Amer >60 >60 mL/min    Comment: (NOTE) The eGFR has been calculated using the CKD EPI equation. This calculation has not been validated in all clinical situations. eGFR's persistently <60 mL/min signify possible Chronic Kidney Disease.    Anion gap 8 5 - 15  CBC     Status: Abnormal   Collection Time: 03/31/16  9:17 PM  Result Value Ref Range   WBC 11.1 (H) 3.6 - 11.0 K/uL   RBC 5.07 3.80 - 5.20 MIL/uL   Hemoglobin 14.2 12.0 - 16.0 g/dL   HCT 42.8 35.0 - 47.0 %   MCV 84.5 80.0 - 100.0 fL   MCH 28.0 26.0 - 34.0 pg   MCHC 33.2 32.0 - 36.0 g/dL   RDW 14.2 11.5 - 14.5 %   Platelets 204 150 - 440 K/uL  Lactic acid, plasma     Status: None   Collection Time: 03/31/16 11:13 PM  Result Value Ref Range   Lactic Acid, Venous 1.9 0.5 - 1.9 mmol/L   Dg Abd 1 View  03/31/2016  CLINICAL DATA:  Low abdominal pain and constipation. EXAM: ABDOMEN - 1 VIEW COMPARISON:  None. FINDINGS: Mild to moderate stool volume. No indication of bowel obstruction or impaction. No concerning intra-abdominal mass effect or calcification. IMPRESSION: Mild to moderate stool volume without signs of obstruction or impaction. Electronically Signed   By: Monte Fantasia M.D.   On: 03/31/2016 21:52   Ct Abdomen Pelvis W Contrast  04/01/2016  CLINICAL DATA:  Low abdominal pain. EXAM: CT ABDOMEN AND PELVIS WITH CONTRAST TECHNIQUE: Multidetector CT imaging of the abdomen and pelvis was performed using the standard protocol following bolus administration of intravenous contrast. CONTRAST:  134m ISOVUE-300 IOPAMIDOL (ISOVUE-300) INJECTION 61% COMPARISON:  None. FINDINGS: Lower chest and abdominal wall: Large hiatal hernia. The GE junction is near the diaphragm, with herniation of the distal stomach. Dependent atelectasis. Hepatobiliary: 21 mm lesion in segment 6 is blood pool on delayed phase and could be shunt or hemangioma.No evidence of biliary  obstruction or stone. Pancreas: Unremarkable. Spleen: Unremarkable. Adrenals/Urinary Tract: Negative adrenals. No hydronephrosis or stone. Unremarkable bladder. Stomach/Bowel: The appendix is dilated, hyper enhancing, and indistinct at the tip where there is perforation. On coronal reformats, there are visible appendiceal diverticula. Extensive fat inflammation in the right lower quadrant. No abscess. Distal colonic diverticulosis Reproductive:Abnormally thickened endometrial cavity for age, measuring 14 mm. No focal measurable lesion is seen. Vascular/Lymphatic: No acute vascular abnormality. Aortic and branch vessel atherosclerosis. No mass or adenopathy. Other: No ascites or pneumoperitoneum. Musculoskeletal: No acute abnormalities. Lower lumbar facet arthropathy with mild L4-5 and L5-S1 anterolisthesis. IMPRESSION: 1. Acute appendicitis with perforated tip. Small non loculated pelvic fluid. No abscess. 2. Abnormally  thickened endometrium. Recommend pelvic sonography after convalescence. 3. Hiatal hernia containing distal stomach. 4. Other incidental findings noted above. Electronically Signed   By: Monte Fantasia M.D.   On: 04/01/2016 00:46     Assessment/Plan  This a patient with a ruptured appendix. Her CT scan is been personally reviewed showing likely tip appendicitis with considerable inflammation in the area with fluid. Her pain started several days ago and she thought that this was constipation and did not seek medical care for several days. I discussed with she and her family member the rationale for admission to the hospital and starting IV antibiotic therapy and fluid therapy to resuscitate her. She will require laparoscopic appendectomy and I discussed the potential for this becoming an open appendectomy based on the day pre-existing findings of ruptured appendicitis. I also discussed the risk of subsequent abscess formation in the future because the appendix is already ruptured. Patient and  family understood and agreed with this plan.  I discussed the risks of bleeding infection abscess formation and conversion to an open procedure and the fact that Dr. Perrin Maltese would be performing the operation and that she will be posted for first thing in the morning.  Florene Glen, MD, FACS

## 2016-04-01 NOTE — Anesthesia Preprocedure Evaluation (Addendum)
Anesthesia Evaluation  Patient identified by MRN, date of birth, ID band Patient awake    Reviewed: Allergy & Precautions, NPO status , Patient's Chart, lab work & pertinent test results  History of Anesthesia Complications Negative for: history of anesthetic complications  Airway Mallampati: II  TM Distance: >3 FB Neck ROM: Full    Dental no notable dental hx.    Pulmonary neg sleep apnea, neg COPD,    breath sounds clear to auscultation- rhonchi (-) wheezing      Cardiovascular Exercise Tolerance: Good (-) hypertension(-) CAD and (-) Past MI  Rhythm:Regular Rate:Normal - Systolic murmurs and - Diastolic murmurs    Neuro/Psych negative psych ROS   GI/Hepatic negative GI ROS, Neg liver ROS, Ruptured appendicitis    Endo/Other  negative endocrine ROS  Renal/GU negative Renal ROS     Musculoskeletal negative musculoskeletal ROS (+)   Abdominal Normal abdominal exam  (+)   Peds  Hematology negative hematology ROS (+)   Anesthesia Other Findings   Reproductive/Obstetrics negative OB ROS                            Anesthesia Physical Anesthesia Plan  ASA: I  Anesthesia Plan: General   Post-op Pain Management:    Induction: Intravenous, Rapid sequence and Cricoid pressure planned  Airway Management Planned: Oral ETT  Additional Equipment:   Intra-op Plan:   Post-operative Plan: Extubation in OR  Informed Consent: I have reviewed the patients History and Physical, chart, labs and discussed the procedure including the risks, benefits and alternatives for the proposed anesthesia with the patient or authorized representative who has indicated his/her understanding and acceptance.   Dental advisory given  Plan Discussed with: Anesthesiologist and CRNA  Anesthesia Plan Comments:        Anesthesia Quick Evaluation

## 2016-04-02 LAB — CBC
HCT: 35.6 % (ref 35.0–47.0)
Hemoglobin: 12.2 g/dL (ref 12.0–16.0)
MCH: 28.3 pg (ref 26.0–34.0)
MCHC: 34.2 g/dL (ref 32.0–36.0)
MCV: 82.7 fL (ref 80.0–100.0)
PLATELETS: 160 10*3/uL (ref 150–440)
RBC: 4.31 MIL/uL (ref 3.80–5.20)
RDW: 14.6 % — AB (ref 11.5–14.5)
WBC: 12.1 10*3/uL — AB (ref 3.6–11.0)

## 2016-04-02 LAB — BASIC METABOLIC PANEL
Anion gap: 3 — ABNORMAL LOW (ref 5–15)
BUN: 9 mg/dL (ref 6–20)
CALCIUM: 8 mg/dL — AB (ref 8.9–10.3)
CO2: 25 mmol/L (ref 22–32)
Chloride: 111 mmol/L (ref 101–111)
Creatinine, Ser: 0.57 mg/dL (ref 0.44–1.00)
GFR calc Af Amer: >60 mL/min (ref >60)
GLUCOSE: 151 mg/dL — AB (ref 65–99)
Potassium: 3.8 mmol/L (ref 3.5–5.1)
SODIUM: 139 mmol/L (ref 135–145)

## 2016-04-02 NOTE — Anesthesia Postprocedure Evaluation (Signed)
Anesthesia Post Note  Patient: Katherine Medina  Procedure(s) Performed: Procedure(s) (LRB): APPENDECTOMY LAPAROSCOPIC (N/A)  Patient location during evaluation: PACU Anesthesia Type: General Level of consciousness: awake and alert and oriented Pain management: pain level controlled Vital Signs Assessment: post-procedure vital signs reviewed and stable Respiratory status: spontaneous breathing, nonlabored ventilation and respiratory function stable Cardiovascular status: blood pressure returned to baseline and stable Postop Assessment: no signs of nausea or vomiting Anesthetic complications: no    Last Vitals:  Filed Vitals:   04/01/16 2043 04/02/16 0512  BP: 112/50 114/48  Pulse: 91 94  Temp: 36.6 C 36.6 C  Resp: 20 20    Last Pain:  Filed Vitals:   04/02/16 0524  PainSc: 2                  Cinsere Mizrahi

## 2016-04-02 NOTE — Progress Notes (Signed)
POD # 1 lap appy perforated Doing well AVSS Taking fluid  PE NAD Abd: incisions c/d/i, soft. JP cloudy fluid  A/P doing well Continue zosyn, advance diet mobilize

## 2016-04-03 LAB — BASIC METABOLIC PANEL
ANION GAP: 5 (ref 5–15)
BUN: 10 mg/dL (ref 6–20)
CHLORIDE: 112 mmol/L — AB (ref 101–111)
CO2: 26 mmol/L (ref 22–32)
Calcium: 8.3 mg/dL — ABNORMAL LOW (ref 8.9–10.3)
Creatinine, Ser: 0.74 mg/dL (ref 0.44–1.00)
GFR calc Af Amer: >60 mL/min (ref >60)
GLUCOSE: 89 mg/dL (ref 65–99)
POTASSIUM: 4 mmol/L (ref 3.5–5.1)
Sodium: 143 mmol/L (ref 135–145)

## 2016-04-03 LAB — CBC
HEMATOCRIT: 38 % (ref 35.0–47.0)
HEMOGLOBIN: 12.8 g/dL (ref 12.0–16.0)
MCH: 28.2 pg (ref 26.0–34.0)
MCHC: 33.7 g/dL (ref 32.0–36.0)
MCV: 83.7 fL (ref 80.0–100.0)
Platelets: 187 10*3/uL (ref 150–440)
RBC: 4.54 MIL/uL (ref 3.80–5.20)
RDW: 14 % (ref 11.5–14.5)
WBC: 12.5 10*3/uL — ABNORMAL HIGH (ref 3.6–11.0)

## 2016-04-03 MED ORDER — AMOXICILLIN-POT CLAVULANATE 875-125 MG PO TABS: 1.0000 | ORAL_TABLET | Freq: Two times a day (BID) | ORAL | Status: DC

## 2016-04-03 MED ORDER — AMOXICILLIN-POT CLAVULANATE 875-125 MG PO TABS
1.0000 | ORAL_TABLET | Freq: Two times a day (BID) | ORAL | Status: DC
  Administered 2016-04-03: 1 via ORAL
  Filled 2016-04-03: qty 1

## 2016-04-03 MED ORDER — OXYCODONE-ACETAMINOPHEN 5-325 MG PO TABS: 1.0000 | ORAL_TABLET | ORAL | Status: DC | PRN

## 2016-04-03 NOTE — Discharge Instructions (Signed)
Laparoscopic Appendectomy, Adult, Care After °Refer to this sheet in the next few weeks. These instructions provide you with information on caring for yourself after your procedure. Your caregiver may also give you more specific instructions. Your treatment has been planned according to current medical practices, but problems sometimes occur. Call your caregiver if you have any problems or questions after your procedure. °HOME CARE INSTRUCTIONS °· Do not drive while taking narcotic pain medicines. °· Use stool softener if you become constipated from your pain medicines. °· Change your bandages (dressings) as directed. °· Keep your wounds clean and dry. You may wash the wounds gently with soap and water. Gently pat the wounds dry with a clean towel. °· Do not take baths, swim, or use hot tubs for 10 days, or as instructed by your caregiver. °· Only take over-the-counter or prescription medicines for pain, discomfort, or fever as directed by your caregiver. °· You may continue your normal diet as directed. °· Do not lift more than 10 pounds (4.5 kg) or play contact sports for 3 weeks, or as directed. °· Slowly increase your activity after surgery. °· Take deep breaths to avoid getting a lung infection (pneumonia). °SEEK MEDICAL CARE IF: °· You have redness, swelling, or increasing pain in your wounds. °· You have pus coming from your wounds. °· You have drainage from a wound that lasts longer than 1 day. °· You notice a bad smell coming from the wounds or dressing. °· Your wound edges break open after stitches (sutures) have been removed. °· You notice increasing pain in the shoulders (shoulder strap areas) or near your shoulder blades. °· You develop dizzy episodes or fainting while standing. °· You develop shortness of breath. °· You develop persistent nausea or vomiting. °· You cannot control your bowel functions or lose your appetite. °· You develop diarrhea. °SEEK IMMEDIATE MEDICAL CARE IF:  °· You have a  fever. °· You develop a rash. °· You have difficulty breathing or sharp pains in your chest. °· You develop any reaction or side effects to medicines given. °MAKE SURE YOU: °· Understand these instructions. °· Will watch your condition. °· Will get help right away if you are not doing well or get worse. °  °This information is not intended to replace advice given to you by your health care provider. Make sure you discuss any questions you have with your health care provider. °  °Document Released: 08/31/2005 Document Revised: 01/15/2015 Document Reviewed: 02/18/2015 °Elsevier Interactive Patient Education ©2016 [employer] ° °

## 2016-04-03 NOTE — Progress Notes (Signed)
Pt stable. IV removed. D/c instructions given and education provided. Family at bedside. Signed prescriptions verified and given. JP education and supplies given. Pt states she understands instructions. Pt dressed and escorted out by staff. Driven home by family.

## 2016-04-03 NOTE — Care Management Important Message (Signed)
Important Message  Patient Details  Name: ELNORA PULSIFER MRN: [id number] Date of Birth: [birth year]   Medicare Important Message Given:  N/A - LOS <3 / Initial given by admissions    Beverly Sessions, RN 04/03/2016, 10:02 AM

## 2016-04-03 NOTE — Discharge Summary (Signed)
Patient ID: Katherine Medina MRN: [id number] DOB/AGE: [birth year] 80 y.o.  Admit date: 03/31/2016 Discharge date: 04/03/2016   Discharge Diagnoses:  Active Problems:   Appendicitis with perforation   Procedures: Laparoscopic appendectomy with placement of drain  Hospital Course: Is an 82 year old female very healthy that came into the emergency room complaining of right lower quadrant pain and some fevers. Workup revealed evidence of perforated appendicitis. She was admitted and started on IV fluids antibiotics and promptly schedule her for a laparoscopic appendectomy. Indeed the appendix was perforated with some purulent fluid but not overt fecal peritonitis. There was also some dense adhesions intraoperatively. Patient did very well postoperatively and an On IV Zosyn due to the degree of contamination of perforation. At the time of discharge she was ambulating, she was tolerating regular diet her vital signs were stable and she was afebrile. Her physical exam revealed very nice lady in no acute distress awake, alert. Abdomen: Soft incisions were healing very well without evidence of infection and JP was asked serous. The extremities were warm and well-perfused. Condition of the patient at time of discharge is stable she will be sent home with one more week of Augmentin by mouth. Instructions given to the patient about JP care  Consults: none  Disposition: Final discharge disposition not confirmed  Discharge Instructions    Call MD for:  difficulty breathing, headache or visual disturbances    Complete by:  As directed      Call MD for:  extreme fatigue    Complete by:  As directed      Call MD for:  hives    Complete by:  As directed      Call MD for:  persistant dizziness or light-headedness    Complete by:  As directed      Call MD for:  persistant nausea and vomiting    Complete by:  As directed      Call MD for:  redness, tenderness, or signs of infection (pain, swelling, redness,  odor or green/yellow discharge around incision site)    Complete by:  As directed      Call MD for:  severe uncontrolled pain    Complete by:  As directed      Call MD for:  temperature >100.4    Complete by:  As directed      Diet - low sodium heart healthy    Complete by:  As directed      Discharge instructions    Complete by:  As directed   May shower today, empty jp BID, please teach pt and family.     Increase activity slowly    Complete by:  As directed      Lifting restrictions    Complete by:  As directed   20 lbs 6 weeks     No wound care    Complete by:  As directed             Medication List    TAKE these medications        amoxicillin-clavulanate 875-125 MG tablet  Commonly known as:  AUGMENTIN  Take 1 tablet by mouth every 12 (twelve) hours.     calcium carbonate 1250 (500 Ca) MG tablet  Commonly known as:  OS-CAL - dosed in mg of elemental calcium  Take 1 tablet by mouth daily.     oxyCODONE-acetaminophen 5-325 MG tablet  Commonly known as:  PERCOCET/ROXICET  Take 1-2 tablets by mouth every 4 (four) hours as  needed for moderate pain.           Follow-up Information    Follow up with Versie Soave, [place] [facility] Go on 04/08/2016.   Why:  Wednesday at 1:30pm for hospital follow-up   Contact information:   [id number] [place], [place] [address] [phone]       Caroleen Hamman, MD FACS

## 2016-04-06 LAB — SURGICAL PATHOLOGY

## 2016-04-08 ENCOUNTER — Ambulatory Visit (INDEPENDENT_AMBULATORY_CARE_PROVIDER_SITE_OTHER): Payer: Medicare Other | Admitting: Surgery

## 2016-04-08 ENCOUNTER — Encounter: Payer: Self-pay | Admitting: Surgery

## 2016-04-08 VITALS — BP 160/75 | HR 92 | Temp 98.7°F | Wt 145.0 lb

## 2016-04-08 DIAGNOSIS — Z09 Encounter for follow-up examination after completed treatment for conditions other than malignant neoplasm: Secondary | ICD-10-CM

## 2016-04-08 NOTE — Patient Instructions (Addendum)
Please go and see your gastroenterologist so he/she could repeat a colonoscopy in a month or two.   Please call our office with any questions or concerns.  Please do not submerge in a tub, hot tub, or pool until incisions are completely sealed.  Use sun block to incision area over the next year if this area will be exposed to sun. This helps decrease scarring.  You may now resume your normal activities. Listen to your body when lifting, if you have pain when lifting, stop and then try again in a few days.  If you develop redness, drainage, or pain at incision sites- call our office immediately and speak with a nurse.

## 2016-04-08 NOTE — Progress Notes (Signed)
S/p lap appy for perf apy Path serrated adenoma no dysplasia, no malignancy Doing well Minimal JP output Taking Po and ambulating  PE NAD Abd: soft, incisions c/d/i, no infection, Jp serous remvoed  A/P doing well Refer to Gi for possible colonoscopy 6 weeeks after appendectomy  Given the serrated polyp that was found  No hevy lifting RTC prn

## 2016-04-09 ENCOUNTER — Telehealth: Payer: Self-pay | Admitting: Surgery

## 2016-04-09 NOTE — Telephone Encounter (Signed)
Patient has been referred to Dr Gustavo Lah office [facility] per Dr Dahlia Byes for for a sessile serrated adenoma. Patient has been advised of her appointment.  04/28/16 with Andreas Newport, NP. 3:30pm--[facility]   Patient verbalized understanding of appointment date and time.

## 2016-04-13 ENCOUNTER — Telehealth: Payer: Self-pay | Admitting: Surgery

## 2016-04-13 NOTE — Telephone Encounter (Signed)
I spoke with patient regarding rash on hips, stomach and thighs. She stated the rash began on Friday and it does not itch. She denies any fever and she states her incision looks good and does not have have any redness or drainage. She said she finished the Augmentin on Friday. Drug allergies were reviewed with patient. We discussed trying some Benadryl to see if this would help and if not to call back to the office and let us know. She stated she was doing fine other than having this rash.

## 2016-04-13 NOTE — Telephone Encounter (Signed)
Patient is post op APPENDECTOMY LAPAROSCOPIC with perforated appendix with Dr Dahlia Byes on 04/01/16. Patient now has a rash on her abdomen, hips and inside her upper thighs. The rash does not itch. Please call and advise.

## 2016-05-05 DIAGNOSIS — K635 Polyp of colon: Secondary | ICD-10-CM | POA: Insufficient documentation

## 2016-05-09 ENCOUNTER — Other Ambulatory Visit: Payer: Self-pay | Admitting: Gastroenterology

## 2016-05-09 DIAGNOSIS — D369 Benign neoplasm, unspecified site: Secondary | ICD-10-CM

## 2016-05-21 ENCOUNTER — Other Ambulatory Visit: Payer: Self-pay | Admitting: Gastroenterology

## 2016-05-21 DIAGNOSIS — R935 Abnormal findings on diagnostic imaging of other abdominal regions, including retroperitoneum: Secondary | ICD-10-CM

## 2016-05-28 ENCOUNTER — Ambulatory Visit
Admission: RE | Admit: 2016-05-28 | Discharge: 2016-05-28 | Disposition: A | Discharge status: Home / Self Care | Payer: Medicare Other | Source: Ambulatory Visit | Attending: Gastroenterology | Admitting: Gastroenterology

## 2016-05-28 DIAGNOSIS — R938 Abnormal findings on diagnostic imaging of other specified body structures: Secondary | ICD-10-CM | POA: Diagnosis not present

## 2016-05-28 DIAGNOSIS — R935 Abnormal findings on diagnostic imaging of other abdominal regions, including retroperitoneum: Secondary | ICD-10-CM | POA: Diagnosis not present

## 2016-06-08 ENCOUNTER — Other Ambulatory Visit: Payer: Medicare Other

## 2016-06-12 DIAGNOSIS — R9389 Abnormal findings on diagnostic imaging of other specified body structures: Secondary | ICD-10-CM | POA: Insufficient documentation

## 2016-06-16 ENCOUNTER — Other Ambulatory Visit: Payer: Medicare Other

## 2016-06-29 ENCOUNTER — Encounter: Payer: Self-pay | Admitting: *Deleted

## 2016-06-30 ENCOUNTER — Encounter
Admission: RE | Disposition: A | Discharge status: Home / Self Care | Payer: Self-pay | Source: Ambulatory Visit | Attending: Gastroenterology

## 2016-06-30 ENCOUNTER — Ambulatory Visit
Admission: RE | Admit: 2016-06-30 | Discharge: 2016-06-30 | Disposition: A | Discharge status: Home / Self Care | Payer: Medicare Other | Source: Ambulatory Visit | Attending: Gastroenterology | Admitting: Gastroenterology

## 2016-06-30 ENCOUNTER — Ambulatory Visit: Payer: Medicare Other | Admitting: Anesthesiology

## 2016-06-30 ENCOUNTER — Encounter: Payer: Self-pay | Admitting: Anesthesiology

## 2016-06-30 DIAGNOSIS — D121 Benign neoplasm of appendix: Secondary | ICD-10-CM | POA: Insufficient documentation

## 2016-06-30 DIAGNOSIS — K5730 Diverticulosis of large intestine without perforation or abscess without bleeding: Secondary | ICD-10-CM | POA: Insufficient documentation

## 2016-06-30 DIAGNOSIS — Z1211 Encounter for screening for malignant neoplasm of colon: Secondary | ICD-10-CM | POA: Insufficient documentation

## 2016-06-30 DIAGNOSIS — C180 Malignant neoplasm of cecum: Secondary | ICD-10-CM | POA: Insufficient documentation

## 2016-06-30 DIAGNOSIS — Z86018 Personal history of other benign neoplasm: Secondary | ICD-10-CM | POA: Diagnosis not present

## 2016-06-30 DIAGNOSIS — D122 Benign neoplasm of ascending colon: Secondary | ICD-10-CM | POA: Insufficient documentation

## 2016-06-30 DIAGNOSIS — D120 Benign neoplasm of cecum: Secondary | ICD-10-CM | POA: Insufficient documentation

## 2016-06-30 HISTORY — PX: COLONOSCOPY WITH PROPOFOL: SHX5780

## 2016-06-30 SURGERY — COLONOSCOPY WITH PROPOFOL
Anesthesia: General

## 2016-06-30 MED ORDER — EPHEDRINE SULFATE 50 MG/ML IJ SOLN
INTRAMUSCULAR | Status: DC | PRN
  Administered 2016-06-30: 5 mg via INTRAVENOUS
  Administered 2016-06-30 (×3): 10 mg via INTRAVENOUS
  Administered 2016-06-30: 5 mg via INTRAVENOUS

## 2016-06-30 MED ORDER — PROPOFOL 500 MG/50ML IV EMUL
INTRAVENOUS | Status: DC | PRN
  Administered 2016-06-30: 100 ug/kg/min via INTRAVENOUS

## 2016-06-30 MED ORDER — PHENYLEPHRINE HCL 10 MG/ML IJ SOLN
INTRAMUSCULAR | Status: DC | PRN
  Administered 2016-06-30 (×2): 50 ug via INTRAVENOUS

## 2016-06-30 MED ORDER — SODIUM CHLORIDE 0.9 % IV SOLN
INTRAVENOUS | Status: DC
  Administered 2016-06-30: 1000 mL via INTRAVENOUS

## 2016-06-30 MED ORDER — SODIUM CHLORIDE 0.9 % IV SOLN: INTRAVENOUS | Status: DC

## 2016-06-30 MED ORDER — SPOT INK MARKER SYRINGE KIT
PACK | SUBMUCOSAL | Status: DC | PRN
  Administered 2016-06-30: 2 mL via SUBMUCOSAL

## 2016-06-30 NOTE — Anesthesia Procedure Notes (Signed)
Performed by: COOK-MARTIN, Jibreel Fedewa Pre-anesthesia Checklist: Patient identified, Emergency Drugs available, Suction available, Patient being monitored and Timeout performed Patient Re-evaluated:Patient Re-evaluated prior to inductionOxygen Delivery Method: Nasal cannula Preoxygenation: Pre-oxygenation with 100% oxygen Intubation Type: IV induction Placement Confirmation: CO2 detector and positive ETCO2       

## 2016-06-30 NOTE — Op Note (Addendum)
[facility] Patient Name: Katherine Medina Procedure Date: 06/30/2016 10:47 AM MRN: [id number] Account #: [id number] Date of Birth: 1929-04-16 Admit Type: Outpatient Age: 82 Room: [facility] ENDO ROOM 3 Gender: Female Note Status: Finalized Procedure:            Colonoscopy Indications:          personal history of appendiceal adenoma. Providers:            Lollie Sails, MD Referring MD:         Eduard Clos. Gilford Rile, MD (Referring MD) Medicines:            Monitored Anesthesia Care Complications:        No immediate complications. Procedure:            Pre-Anesthesia Assessment:                       - ASA Grade Assessment: II - A patient with mild                        systemic disease.                       After obtaining informed consent, the colonoscope was                        passed under direct vision. Throughout the procedure,                        the patient's blood pressure, pulse, and oxygen                        saturations were monitored continuously. The                        Colonoscope was introduced through the anus and                        advanced to the the cecum, identified by appendiceal                        orifice and ileocecal valve. The colonoscopy was                        extremely difficult due to restricted mobility of the                        colon and a tortuous colon. Successful completion of                        the procedure was aided by changing the patient to a                        supine position, changing the patient to a prone                        position and using manual pressure. The quality of the                        bowel preparation was good. Findings:      The colon (entire examined portion) was  significantly tortuous.      Multiple small and large-mouthed diverticula were found in the entire       colon.      A 4 mm polyp was found adjacent to the remnant of the appendiceal    orifice, about a cm away from appendiceal orifice. The polyp was       sessile. The polyp was removed with a cold snare. Resection and       retrieval were complete.      A 50 mm polyp was found in the cecum. The polyp was carpet-like and       sessile, different morphologies. Biopsies were taken with a cold forceps       for histology separately from the sessile carpet-like area and from the       more prominant protruding area and placed into separate jar.      There was a small area of atypical mucosa, possibly a small lipoma, 1 mm       in diameter, in the opposite side of the cecum. Biopsies were taken with       a cold forceps for histology, this showing an extruding fat sign.      A 50 mm polyp was found in the mid ascending colon. The polyp was       carpet-like. Biopsies were taken with a cold forceps for histology.       There appeared to be an area of possible old tatoo adjacent to this       lesion, which went over the back of the fold to a much larger extent       than that seen on the distal side. Area was tattooed with an injection       of 2 mL of [place] ink.      The digital rectal exam was normal. Impression:           - Tortuous colon.                       - Diverticulosis in the entire examined colon.                       - One 4 mm polyp at the appendiceal orifice, removed                        with a cold snare. Resected and retrieved.                       - One 50 mm polyp in the cecum. Biopsied.                       - Small lipoma in the cecum. Biopsied.                       - One 50 mm polyp in the mid ascending colon. Biopsied.                        Tattooed. Recommendation:       - Await pathology results.                       - Telephone GI clinic for pathology results in 1 week. Procedure Code(s):    --- Professional ---  [phone], Colonoscopy, flexible; with removal of tumor(s),                        polyp(s), or other lesion(s) by  snare technique                       45380, 59, Colonoscopy, flexible; with biopsy, single                        or multiple                       45381, Colonoscopy, flexible; with directed submucosal                        injection(s), any substance Diagnosis Code(s):    --- Professional ---                       D12.1, Benign neoplasm of appendix                       D12.0, Benign neoplasm of cecum                       D12.2, Benign neoplasm of ascending colon                       D17.5, Benign lipomatous neoplasm of intra-abdominal                        organs                       K57.30, Diverticulosis of large intestine without                        perforation or abscess without bleeding                       Q43.8, Other specified congenital malformations of                        intestine CPT copyright 2016 American Medical Association. All rights reserved. The codes documented in this report are preliminary and upon coder review may  be revised to meet current compliance requirements. Lollie Sails, MD 06/30/2016 12:02:28 PM This report has been signed electronically. Number of Addenda: 0 Note Initiated On: 06/30/2016 10:47 AM Scope Withdrawal Time: 0 hours 21 minutes 53 seconds  Total Procedure Duration: 0 hours 54 minutes 6 seconds       [facility]

## 2016-06-30 NOTE — Transfer of Care (Signed)
Immediate Anesthesia Transfer of Care Note  Patient: Katherine Medina  Procedure(s) Performed: Procedure(s): COLONOSCOPY WITH PROPOFOL (N/A)  Patient Location: PACU  Anesthesia Type:General  Level of Consciousness: awake and sedated  Airway & Oxygen Therapy: Patient Spontanous Breathing and Patient connected to nasal cannula oxygen  Post-op Assessment: Report given to RN and Post -op Vital signs reviewed and stable  Post vital signs: Reviewed and stable  Last Vitals:  Vitals:   06/30/16 0859  BP: (!) 169/67  Pulse: 92  Resp: 16  Temp: 36.7 C    Last Pain:  Vitals:   06/30/16 0859  TempSrc: Tympanic         Complications: No apparent anesthesia complications

## 2016-06-30 NOTE — H&P (Signed)
Outpatient short stay form Pre-procedure 06/30/2016 10:40 AM Lollie Sails MD  Primary Physician: Dr. Lisette Grinder  Reason for visit:  Colonoscopy  History of present illness:   Patient is a 82 year old female presenting today for colonoscopy. She has a recent history of presenting with abdominal pain to the emergency room on 04/01/2016. At that time she presented with acute appendicitis. She underwent emergent surgery. The appendix was noted to have a serrated sessile adenoma this possibly contributing to the appendicitis and acute perforation of the appendix. She has been referred for colonoscopic evaluation. It is of note that the proximal margin of the resection was negative. She tolerated her prep well. She takes no aspirin or blood thinning agents.     Current Facility-Administered Medications:  .  0.9 %  sodium chloride infusion, , Intravenous, Continuous, Lollie Sails, MD, Last Rate: 20 mL/hr at 06/30/16 0913, 1,000 mL at 06/30/16 0913 .  0.9 %  sodium chloride infusion, , Intravenous, Continuous, Lollie Sails, MD  Prescriptions Prior to Admission  Medication Sig Dispense Refill Last Dose  . calcium carbonate (OS-CAL - DOSED IN MG OF ELEMENTAL CALCIUM) 1250 (500 Ca) MG tablet Take 1 tablet by mouth daily.   Past Week at Unknown time  . amoxicillin-clavulanate (AUGMENTIN) 875-125 MG tablet Take 1 tablet by mouth every 12 (twelve) hours. (Patient not taking: Reported on 06/30/2016) 14 tablet 0 Not Taking at Unknown time     No Known Allergies   History reviewed. No pertinent past medical history.  Review of systems:      Physical Exam    Heart and lungs: Regular rate and rhythm without rub or gallop, lungs are bilaterally clear.    HEENT: Normocephalic atraumatic eyes are anicteric    Other:     Pertinant exam for procedure: Soft nontender nondistended bowel sounds positive normoactive.    Planned proceedures: Colonoscopy and indicated procedures. I have  discussed the risks benefits and complications of procedures to include not limited to bleeding, infection, perforation and the risk of sedation and the patient wishes to proceed.    Lollie Sails, MD Gastroenterology 06/30/2016  10:40 AM

## 2016-06-30 NOTE — Anesthesia Postprocedure Evaluation (Signed)
Anesthesia Post Note  Patient: Katherine Medina  Procedure(s) Performed: Procedure(s) (LRB): COLONOSCOPY WITH PROPOFOL (N/A)  Patient location during evaluation: PACU Anesthesia Type: General Level of consciousness: awake Pain management: pain level controlled Vital Signs Assessment: post-procedure vital signs reviewed and stable Respiratory status: spontaneous breathing Cardiovascular status: stable Anesthetic complications: no    Last Vitals:  Vitals:   06/30/16 0859 06/30/16 1157  BP: (!) 169/67 (!) 109/45  Pulse: 92   Resp: 16   Temp: 36.7 C (!) 36.1 C    Last Pain:  Vitals:   06/30/16 1157  TempSrc: Tympanic                 VAN STAVEREN,Damira Kem

## 2016-06-30 NOTE — Anesthesia Preprocedure Evaluation (Signed)
Anesthesia Evaluation  Patient identified by MRN, date of birth, ID band Patient awake    Reviewed: Allergy & Precautions, NPO status , Patient's Chart, lab work & pertinent test results  Airway Mallampati: III  TM Distance: <3 FB     Dental  (+) Teeth Intact   Pulmonary neg pulmonary ROS,    breath sounds clear to auscultation       Cardiovascular Exercise Tolerance: Good  Rhythm:Regular     Neuro/Psych negative neurological ROS     GI/Hepatic negative GI ROS, Neg liver ROS,   Endo/Other  negative endocrine ROS  Renal/GU negative Renal ROS     Musculoskeletal negative musculoskeletal ROS (+)   Abdominal Normal abdominal exam  (+)   Peds negative pediatric ROS (+)  Hematology   Anesthesia Other Findings   Reproductive/Obstetrics                             Anesthesia Physical Anesthesia Plan  ASA: II  Anesthesia Plan: General   Post-op Pain Management:    Induction: Intravenous  Airway Management Planned: Natural Airway and Nasal Cannula  Additional Equipment:   Intra-op Plan:   Post-operative Plan:   Informed Consent: I have reviewed the patients History and Physical, chart, labs and discussed the procedure including the risks, benefits and alternatives for the proposed anesthesia with the patient or authorized representative who has indicated his/her understanding and acceptance.     Plan Discussed with: CRNA  Anesthesia Plan Comments:         Anesthesia Quick Evaluation

## 2016-07-02 ENCOUNTER — Other Ambulatory Visit: Payer: Self-pay

## 2016-07-02 ENCOUNTER — Encounter
Admission: RE | Admit: 2016-07-02 | Discharge: 2016-07-02 | Disposition: A | Discharge status: Home / Self Care | Payer: Medicare Other | Source: Ambulatory Visit | Attending: Obstetrics & Gynecology | Admitting: Obstetrics & Gynecology

## 2016-07-02 DIAGNOSIS — Z01812 Encounter for preprocedural laboratory examination: Secondary | ICD-10-CM | POA: Insufficient documentation

## 2016-07-02 DIAGNOSIS — Z0183 Encounter for blood typing: Secondary | ICD-10-CM | POA: Insufficient documentation

## 2016-07-02 DIAGNOSIS — Z01811 Encounter for preprocedural respiratory examination: Secondary | ICD-10-CM

## 2016-07-02 DIAGNOSIS — R938 Abnormal findings on diagnostic imaging of other specified body structures: Secondary | ICD-10-CM | POA: Insufficient documentation

## 2016-07-02 DIAGNOSIS — I493 Ventricular premature depolarization: Secondary | ICD-10-CM | POA: Insufficient documentation

## 2016-07-02 DIAGNOSIS — Z01818 Encounter for other preprocedural examination: Secondary | ICD-10-CM | POA: Diagnosis present

## 2016-07-02 HISTORY — DX: Other complications of anesthesia, initial encounter: T88.59XA

## 2016-07-02 HISTORY — DX: Adverse effect of unspecified anesthetic, initial encounter: T41.45XA

## 2016-07-02 HISTORY — DX: Other specified health status: Z78.9

## 2016-07-02 LAB — CBC
HEMATOCRIT: 40.6 % (ref 35.0–47.0)
Hemoglobin: 13.5 g/dL (ref 12.0–16.0)
MCH: 27.7 pg (ref 26.0–34.0)
MCHC: 33.3 g/dL (ref 32.0–36.0)
MCV: 83.2 fL (ref 80.0–100.0)
PLATELETS: 171 10*3/uL (ref 150–440)
RBC: 4.88 MIL/uL (ref 3.80–5.20)
RDW: 15.2 % — AB (ref 11.5–14.5)
WBC: 5.8 10*3/uL (ref 3.6–11.0)

## 2016-07-02 LAB — BASIC METABOLIC PANEL
ANION GAP: 6 (ref 5–15)
BUN: 11 mg/dL (ref 6–20)
CALCIUM: 9.1 mg/dL (ref 8.9–10.3)
CO2: 28 mmol/L (ref 22–32)
Chloride: 106 mmol/L (ref 101–111)
Creatinine, Ser: 0.62 mg/dL (ref 0.44–1.00)
GFR calc Af Amer: >60 mL/min (ref >60)
GFR calc non Af Amer: >60 mL/min (ref >60)
GLUCOSE: 88 mg/dL (ref 65–99)
Potassium: 3.5 mmol/L (ref 3.5–5.1)
Sodium: 140 mmol/L (ref 135–145)

## 2016-07-02 LAB — TYPE AND SCREEN
ABO/RH(D): O NEG
ANTIBODY SCREEN: NEGATIVE

## 2016-07-02 NOTE — Patient Instructions (Addendum)
  Your procedure is scheduled [id number] Oct. 27 , 2017. Report to Same Day Surgery. To find out your arrival time please call [phone] between 1PM - 3PM on Thursday Jul 09, 2016.  Remember: Instructions that are not followed completely may result in serious medical risk, up to and including death, or upon the discretion of your surgeon and anesthesiologist your surgery may need to be rescheduled.    _x___ 1. Do not eat food or drink liquids after midnight. No gum chewing or hard candies.     ____ 2. No Alcohol for 24 hours before or after surgery.   ____ 3. Bring all medications with you on the day of surgery if instructed.    __x__ 4. Notify your doctor if there is any change in your medical condition     (cold, fever, infections).    _____ 5. No smoking 24 hours prior to surgery.     Do not wear jewelry, make-up, hairpins, clips or nail polish.  Do not wear lotions, powders, or perfumes.   Do not shave 48 hours prior to surgery. Men may shave face and neck.  Do not bring valuables to the hospital.    [facility] is not responsible for any belongings or valuables.               Contacts, dentures or bridgework may not be worn into surgery.  Leave your suitcase in the car. After surgery it may be brought to your room.  For patients admitted to the hospital, discharge time is determined by your  treatment team.   Patients discharged the day of surgery will not be allowed to drive home.    Please read over the following fact sheets that you were given:   [facility] Preparing for Surgery  ____ Take these medicines the morning of surgery with A SIP OF WATER: NONE   ____ Fleet Enema (as directed)   ____ Use CHG Soap as directed on instruction sheet  ____ Use inhalers on the day of surgery and bring to hospital day of surgery  ____ Stop metformin 2 days prior to surgery    ____ Take 1/2 of usual insulin dose the night before surgery and none on the morning of surgery.    ____ Stop Coumadin/Plavix/aspirin does not apply.  __x__ Stop Anti-inflammatories such as Advil, Aleve, Ibuprofen, Motrin, Naproxen,  Naprosyn, Goodies powders or aspirin products. OK to take Tylenol.   ____ Stop supplements until after surgery.    ____ Bring C-Pap to the hospital.

## 2016-07-02 NOTE — Pre-Procedure Instructions (Signed)
Dr. Leonides Schanz called with verbal/telephone pre-op orders & will have H+P to PAT prior to surgery.

## 2016-07-04 ENCOUNTER — Encounter: Payer: Self-pay | Admitting: Gastroenterology

## 2016-07-05 NOTE — H&P (Addendum)
Chief Complaint:      Chief Complaint  Patient presents with  . New Consultation  . abnormal ultrasound results    HPI:  Katherine Medina is a 82 y.o. [id number] here for female here for New Consultation and abnormal ultrasound results .              Location:  endometrium            Quality:  Thickened, heterogeneous             Severity:  n/a            Duration:  Unknown, CT was 03/2016            Timing:  constant            Modifying factors: n/a            Associated signs and symptoms:  Denies cramping, spotting, discomfort, weight gain/loss, decreased appetite, melena, change in bowel or bladder habits            Context:   Katherine Medina presented to the ED in July with complaints of low abdominal pain and was found to have a ruptured appendix on CT scan.  She was also incidentally found to have a thickened endometrium, measuring 25m.  After surgery and recovery she had an ultrasound of the pelvis that confirmed the thickening with a otherwise normal uterus and undefined adnexa (no ovaries were identified but no abnormal structures seen either)  Treatments and evaluations: CT scan, Ultrasound.  No LMP recorded. Patient is postmenopausal.               Problem List  Date Reviewed: 808/25/2017        Codes Priority Class Noted - Resolved   Thickened endometrium ICD-10-CM: R93.8 ICD-9-CM: 793.5   06/12/2016 - Present   Colon polyps ICD-10-CM: K63.5 ICD-9-CM: 211.3   82017-08-25- Present   Appendicitis with perforation ICD-10-CM: K35.2 ICD-9-CM: 540.0   04/01/2016 - Present       Past Medical History:  has a past medical history of Appendicitis and Colon polyp.  Past Surgical History:  has a past surgical history that includes Colonoscopy; Appendectomy (03/2016); and Cesarean section. Family History: family history is not on file. Social History:  reports that she has never smoked. She has never used smokeless tobacco. She reports that she does not  drink alcohol or use drugs. OB/GYN History:          OB History    Gravida Para Term Preterm AB Living   _0 SAB TAB Ectopic Molar Multiple Live Births                    Allergies: has No Known Allergies. Medications:  Current Outpatient Prescriptions:  .  calcium carbonate 500 mg calcium (1,250 mg) tablet, Take by mouth., Disp: , Rfl:   Review of Systems: General:                      No fatigue or weight loss Eyes:                           No vision changes Ears:                            No hearing difficulty Respiratory:  No cough or shortness of breath Pulmonary:                  No asthma or shortness of breath Cardiovascular:           No chest pain, palpitations, dyspnea on exertion Gastrointestinal:          No abdominal bloating, chronic diarrhea, constipations, masses, pain or hematochezia Genitourinary:             No hematuria, dysuria, abnormal vaginal discharge, pelvic pain, Menometrorrhagia Lymphatic:                   No swollen lymph nodes Musculoskeletal:         No muscle weakness Neurologic:                  No extremity weakness, syncope, seizure disorder Psychiatric:                  No history of depression, delusions or suicidal/homicidal ideation    Exam:      Vitals:   06/11/16 1525  BP: 100/64  Pulse: 70    Body mass index is 26.38 kg/m.  WDWN white female in NAD   Lungs: CTA, normal respiratory effort  CV : RRR without murmur   Breast: exam done in sitting and lying position : No dimpling or retraction, no dominant mass, no spontaneous discharge, no axillary adenopathy Neck:  no thyromegaly Abdomen: soft , no mass, normal active bowel sounds,  non-tender, no rebound tenderness Pelvic: tanner stage 5 ,              External genitalia: vulva /labia no lesions             Urethra: no prolapse             Vagina: normal physiologic d/c             Cervix: no lesions, no cervical motion tenderness  , stenotic os              Uterus: normal size shape and contour, non-tender             Adnexa: no mass,  non-tender               Rectovaginal: no mass heme negative  An endometrial biopsy was attempted but due to cervical os stenosis was abandonened  Ultrasound: 05/28/16  Uterus 7x3x3cm EE: 34m, heterogeneous, with internal doppler flow RO: not visualized LO: not visualized No free fluid     Impression:   The primary encounter diagnosis was Thickened endometrium. A diagnosis of Cervical os stenosis was also pertinent to this visit.    Plan:   - due to inability to sample endometrium in-office, will arrange for a D&C, Hysteroscopy. - risks, benefits and alternatives were discussed in detail, and informed consent signed. - Katherine Medina scheduled for a colonoscopy mid-October and would like to have this procedure done after that.    I personally performed the service.  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  I have requested MSI testing on this specimen.  It is pertinent to continue with sampling of the endometrium; if there is carcinoma or atypia a hysterectomy is recommended, and this information would guide surgical management.

## 2016-07-07 ENCOUNTER — Other Ambulatory Visit: Payer: Self-pay | Admitting: Pathology

## 2016-07-07 DIAGNOSIS — C180 Malignant neoplasm of cecum: Secondary | ICD-10-CM

## 2016-07-08 ENCOUNTER — Other Ambulatory Visit: Payer: Self-pay

## 2016-07-09 ENCOUNTER — Inpatient Hospital Stay: Payer: Medicare Other | Attending: Pathology

## 2016-07-09 ENCOUNTER — Encounter: Payer: Self-pay | Admitting: Surgery

## 2016-07-09 ENCOUNTER — Ambulatory Visit (INDEPENDENT_AMBULATORY_CARE_PROVIDER_SITE_OTHER): Payer: Medicare Other | Admitting: Surgery

## 2016-07-09 VITALS — BP 155/79 | HR 79 | Temp 97.7°F | Ht 65.0 in | Wt 138.8 lb

## 2016-07-09 DIAGNOSIS — D010 Carcinoma in situ of colon: Secondary | ICD-10-CM | POA: Diagnosis not present

## 2016-07-09 NOTE — Patient Instructions (Signed)
Please follow-up with Dr. Leonides Schanz as scheduled.   We will have Dr. Dahlia Byes see you back in the office and coordinate a plan for surgery based on your biopsy results from Dr. Guido Sander appointment tomorrow.  Please see follow-up information below.   Please call with any questions or concerns.

## 2016-07-09 NOTE — Pre-Procedure Instructions (Signed)
We were unable to get the Chest Xray on PAT visit 07/02/16 due to a verbal order from Dr. Leonides Schanz to this RN.  Spoke with Dr. Guido Sander nurse and requested Dr. Leonides Schanz place CXR order into epic for tomorrow preop.

## 2016-07-09 NOTE — Pre-Procedure Instructions (Signed)
Dr. Leonides Schanz called and stated patient did not need the CXR and can cancel order. Not able to find order in Epic.

## 2016-07-09 NOTE — Progress Notes (Signed)
Surgical Consultation  07/09/2016  Katherine Medina is an 82 y.o. female.   CC: Cecal cancer  HPI: This a patient just over 3 months status post laparoscopic appendectomy for ruptured appendix by Dr. Dahlia Byes. She had a workup because the appendiceal specimen demonstrated an adenoma that included a colonoscopy. The colonoscopy demonstrated a cecal polyp that was biopsied and showed adenocarcinoma. She is here to discuss surgical options.  Of significance is that tomorrow she has a D&C ordered for suspicious endometrial lining by Dr. Leonides Schanz. There is a concern that she would require a hysterectomy at some point and that the hysterectomy and colon resection should be considered at the same operation.  Patient does not smoke or drink she has a weak knee but takes no medications and has no medical problems other than the above-mentioned current workup.  Past Medical History:  Diagnosis Date  . Complication of anesthesia    BP dropped, Dr. Staci Acosta had to stop the procedure.  . Hypotension   . Medical history non-contributory     Past Surgical History:  Procedure Laterality Date  . [place]  . COLONOSCOPY    . COLONOSCOPY WITH PROPOFOL N/A 06/30/2016   Procedure: COLONOSCOPY WITH PROPOFOL;  Surgeon: Lollie Sails, MD;  [facility] ENDOSCOPY;  Service: Endoscopy;  Laterality: N/A;  . LAPAROSCOPIC APPENDECTOMY N/A 04/01/2016   Procedure: APPENDECTOMY LAPAROSCOPIC;  Surgeon: Jules Husbands, MD;  Location: [facility] ORS;  Service: General;  Laterality: N/A;    Family History  Problem Relation Age of Onset  . Diabetes Mother   . Hypertension Father   . Colon cancer Neg Hx     Social History:  reports that she has never smoked. She has never used smokeless tobacco. She reports that she does not drink alcohol or use drugs.  Allergies: No Known Allergies  Medications reviewed.   Review of Systems:   Review of Systems  Constitutional: Negative for chills, fever and  weight loss.  HENT: Negative.   Eyes: Negative.   Respiratory: Negative.   Cardiovascular: Negative.   Gastrointestinal: Negative for abdominal pain, blood in stool, constipation, diarrhea, heartburn, melena, nausea and vomiting.  Genitourinary: Negative.   Musculoskeletal: Negative.   Skin: Negative.   Neurological: Negative.   Endo/Heme/Allergies: Negative.   Psychiatric/Behavioral: Negative.      Physical Exam:  BP (!) 155/79   Pulse 79   Temp 97.7 F (36.5 C) (Oral)   Ht 5\' 5"  (1.651 m)   Wt 138 lb 12.8 oz (63 kg)   BMI 23.10 kg/m   Physical Exam  Constitutional: She is oriented to person, place, and time and well-developed, well-nourished, and in no distress. No distress.  HENT:  Head: Normocephalic and atraumatic.  Eyes: Pupils are equal, round, and reactive to light. Right eye exhibits no discharge. Left eye exhibits no discharge. No scleral icterus.  Neck: Normal range of motion.  Cardiovascular: Normal rate, regular rhythm and normal heart sounds.   Pulmonary/Chest: Effort normal and breath sounds normal. No respiratory distress. She has no wheezes. She has no rales.  Abdominal: Soft. She exhibits no distension. There is no tenderness. There is no rebound and no guarding.  Scars are present and are healing well without erythema  Musculoskeletal: Normal range of motion. She exhibits no edema.  Lymphadenopathy:    She has no cervical adenopathy.  Neurological: She is alert and oriented to person, place, and time.  Skin: Skin is warm and dry. No rash noted. She  is not diaphoretic. No erythema.  Psychiatric: Mood and affect normal.  Vitals reviewed.     No results found for this or any previous visit (from the past 48 hour(s)). No results found.  Assessment/Plan:  Pathology is personally reviewed as are the operative reports. Patient is scheduled for a D&C tomorrow for diagnostic purposes for suspicious endometrial lining. This may require a  hysterectomy.  She also has a newly identified cecal cancer and will require a right colon resection.  The potential for combined surgery was discussed with the patient the procedure of colon resection was discussed with him in detail and pictures or schematics were shown identifying the location of the cancer and the proposed resection.  Because Dr. Dahlia Byes is the patient's surgeon from 3 months ago I would recommend that she come back to see him as soon as Dr. Leonides Schanz has made a decision concerning the need for hysterectomy. At that point then surgical resection in a combined approach could be discussed. Patient and husband understood this and will have her procedure tomorrow follow-up with Dr. Leonides Schanz for surgical planning if hysterectomy is indicated and then follow-up with Dr. Perrin Maltese for combined surgical discussion.  Florene Glen, MD, FACS

## 2016-07-10 ENCOUNTER — Encounter
Admission: RE | Disposition: A | Discharge status: Home / Self Care | Payer: Self-pay | Source: Ambulatory Visit | Attending: Obstetrics & Gynecology

## 2016-07-10 ENCOUNTER — Ambulatory Visit: Payer: Medicare Other | Admitting: Anesthesiology

## 2016-07-10 ENCOUNTER — Ambulatory Visit
Admission: RE | Admit: 2016-07-10 | Discharge: 2016-07-10 | Disposition: A | Discharge status: Home / Self Care | Payer: Medicare Other | Source: Ambulatory Visit | Attending: Obstetrics & Gynecology | Admitting: Obstetrics & Gynecology

## 2016-07-10 DIAGNOSIS — N813 Complete uterovaginal prolapse: Secondary | ICD-10-CM | POA: Insufficient documentation

## 2016-07-10 DIAGNOSIS — N840 Polyp of corpus uteri: Secondary | ICD-10-CM | POA: Insufficient documentation

## 2016-07-10 DIAGNOSIS — R938 Abnormal findings on diagnostic imaging of other specified body structures: Secondary | ICD-10-CM | POA: Diagnosis present

## 2016-07-10 HISTORY — PX: HYSTEROSCOPY WITH D & C: SHX1775

## 2016-07-10 LAB — ABO/RH: ABO/RH(D): O NEG

## 2016-07-10 SURGERY — DILATATION AND CURETTAGE /HYSTEROSCOPY
Anesthesia: General | Wound class: Clean Contaminated

## 2016-07-10 MED ORDER — FENTANYL CITRATE (PF) 100 MCG/2ML IJ SOLN: 25.0000 ug | INTRAMUSCULAR | Status: DC | PRN

## 2016-07-10 MED ORDER — FENTANYL CITRATE (PF) 100 MCG/2ML IJ SOLN
INTRAMUSCULAR | Status: DC | PRN
  Administered 2016-07-10: 25 ug via INTRAVENOUS

## 2016-07-10 MED ORDER — FAMOTIDINE 20 MG PO TABS
ORAL_TABLET | ORAL | Status: AC
  Administered 2016-07-10: 20 mg via ORAL
  Filled 2016-07-10: qty 1

## 2016-07-10 MED ORDER — MEPERIDINE HCL 25 MG/ML IJ SOLN: 6.2500 mg | INTRAMUSCULAR | Status: DC | PRN

## 2016-07-10 MED ORDER — FAMOTIDINE 20 MG PO TABS
20.0000 mg | ORAL_TABLET | Freq: Once | ORAL | Status: AC
  Administered 2016-07-10: 20 mg via ORAL

## 2016-07-10 MED ORDER — EPHEDRINE SULFATE 50 MG/ML IJ SOLN
INTRAMUSCULAR | Status: DC | PRN
  Administered 2016-07-10 (×2): 10 mg via INTRAVENOUS

## 2016-07-10 MED ORDER — LACTATED RINGERS IV SOLN
INTRAVENOUS | Status: DC
  Administered 2016-07-10: 07:00:00 via INTRAVENOUS

## 2016-07-10 MED ORDER — OXYCODONE HCL 5 MG/5ML PO SOLN: 5.0000 mg | Freq: Once | ORAL | Status: DC | PRN

## 2016-07-10 MED ORDER — PROPOFOL 10 MG/ML IV BOLUS
INTRAVENOUS | Status: DC | PRN
  Administered 2016-07-10: 80 mg via INTRAVENOUS

## 2016-07-10 MED ORDER — OXYCODONE HCL 5 MG PO TABS: 5.0000 mg | ORAL_TABLET | Freq: Once | ORAL | Status: DC | PRN

## 2016-07-10 MED ORDER — LIDOCAINE HCL (PF) 1 % IJ SOLN
INTRAMUSCULAR | Status: AC
  Filled 2016-07-10: qty 30

## 2016-07-10 MED ORDER — DEXAMETHASONE SODIUM PHOSPHATE 10 MG/ML IJ SOLN
INTRAMUSCULAR | Status: DC | PRN
  Administered 2016-07-10: 4 mg via INTRAVENOUS

## 2016-07-10 MED ORDER — PROMETHAZINE HCL 25 MG/ML IJ SOLN: 6.2500 mg | INTRAMUSCULAR | Status: DC | PRN

## 2016-07-10 MED ORDER — ONDANSETRON HCL 4 MG/2ML IJ SOLN
INTRAMUSCULAR | Status: DC | PRN
  Administered 2016-07-10: 4 mg via INTRAVENOUS

## 2016-07-10 SURGICAL SUPPLY — 17 items
CORD URO TURP 10FT (MISCELLANEOUS) IMPLANT
ELECT REM PT RETURN 9FT ADLT (ELECTROSURGICAL) ×2
ELECT RESECT POWERBALL 24F (MISCELLANEOUS) IMPLANT
ELECTRODE REM PT RTRN 9FT ADLT (ELECTROSURGICAL) ×1 IMPLANT
GLOVE PI ORTHOPRO 6.5 (GLOVE) ×1
GLOVE PI ORTHOPRO STRL 6.5 (GLOVE) ×1 IMPLANT
GLOVE SURG SYN 6.5 ES PF (GLOVE) ×2 IMPLANT
GOWN STRL REUS W/ TWL LRG LVL3 (GOWN DISPOSABLE) ×2 IMPLANT
GOWN STRL REUS W/TWL LRG LVL3 (GOWN DISPOSABLE) ×2
IV LACTATED RINGERS 1000ML (IV SOLUTION) ×2 IMPLANT
KIT RM TURNOVER CYSTO AR (KITS) ×2 IMPLANT
NEEDLE SPNL 22GX3.5 QUINCKE BK (NEEDLE) ×2 IMPLANT
PACK DNC HYST (MISCELLANEOUS) ×2 IMPLANT
PAD OB MATERNITY 4.3X12.25 (PERSONAL CARE ITEMS) ×2 IMPLANT
PAD PREP 24X41 OB/GYN DISP (PERSONAL CARE ITEMS) ×2 IMPLANT
SYRINGE 10CC LL (SYRINGE) ×2 IMPLANT
TUBING CONNECTING 10 (TUBING) ×2 IMPLANT

## 2016-07-10 NOTE — Anesthesia Procedure Notes (Signed)
Procedure Name: LMA Insertion Date/Time: 07/10/2016 7:35 AM Performed by: Darlyne Russian Pre-anesthesia Checklist: Patient identified, Emergency Drugs available, Suction available, Patient being monitored and Timeout performed Patient Re-evaluated:Patient Re-evaluated prior to inductionOxygen Delivery Method: Circle system utilized Preoxygenation: Pre-oxygenation with 100% oxygen Intubation Type: IV induction Ventilation: Mask ventilation without difficulty LMA: LMA inserted LMA Size: 3.0 Number of attempts: 1 Placement Confirmation: positive ETCO2 and breath sounds checked- equal and bilateral Dental Injury: Teeth and Oropharynx as per pre-operative assessment

## 2016-07-10 NOTE — H&P (Signed)
H&P Update  PLEASE SEE PAPER H&P  Pt was last seen in my office, and complete history and physical performed.  The surgical history has been reviewed and remains accurate without interval change. The patient was re-examined and patient's physiologic condition has not changed significantly in the last 30 days.  No new pharmacological allergies or types of therapy has been initiated.  She has been diagnosed with colon cancer.  No Known Allergies  Past Medical History:  Diagnosis Date  . Complication of anesthesia    BP dropped, Dr. Staci Acosta had to stop the procedure.  . Hypotension   . Medical history non-contributory    Past Surgical History:  Procedure Laterality Date  . [place]  . COLONOSCOPY    . COLONOSCOPY WITH PROPOFOL N/A 06/30/2016   Procedure: COLONOSCOPY WITH PROPOFOL;  Surgeon: Lollie Sails, MD;  Location: [facility] ENDOSCOPY;  Service: Endoscopy;  Laterality: N/A;  . LAPAROSCOPIC APPENDECTOMY N/A 04/01/2016   Procedure: APPENDECTOMY LAPAROSCOPIC;  Surgeon: Jules Husbands, MD;  Location: [facility] ORS;  Service: General;  Laterality: N/A;    BP (!) 165/76   Pulse 80   Temp 99 F (37.2 C) (Tympanic)   Resp 16   Wt 62.6 kg (138 lb)   SpO2 98%   BMI 22.96 kg/m   NAD RRR no murmurs CTAB, no wheezing, resps unlabored +BS, soft, NTTP No c/c/e Pelvic exam deferred  The above history was confirmed with the patient. The condition still exists that makes this procedure necessary. Surgical plan includes Hysteroscopy, D&C, as confirmed on the consent. The treatment plan remains the same, without new options for care.  The patient understands the potential benefits and risks and the consents have been signed and placed on the chart.     Larey Days, MD Attending Obstetrician Gynecologist [facility], Department of [facility]

## 2016-07-10 NOTE — Discharge Instructions (Signed)
You should expect to have some cramping and vaginal bleeding for about a week. This should taper off and subside, much like a period. If heavy bleeding continues or gets worse, you should contact the office for an earlier appointment.   Please call the office or physician on call for fever >101, severe pain, and heavy bleeding.   [phone]     AMBULATORY SURGERY  DISCHARGE INSTRUCTIONS   1) The drugs that you were given will stay in your system until tomorrow so for the next 24 hours you should not:  A) Drive an automobile B) Make any legal decisions C) Drink any alcoholic beverage   2) You may resume regular meals tomorrow.  Today it is better to start with liquids and gradually work up to solid foods.  You may eat anything you prefer, but it is better to start with liquids, then soup and crackers, and gradually work up to solid foods.   3) Please notify your doctor immediately if you have any unusual bleeding, trouble breathing, redness and pain at the surgery site, drainage, fever, or pain not relieved by medication.    4) Additional Instructions:        Please contact your physician with any problems or Same Day Surgery at [phone], Monday through Friday 6 am to 4 pm, or [place] at [facility] number at [phone].

## 2016-07-10 NOTE — Anesthesia Postprocedure Evaluation (Signed)
Anesthesia Post Note  Patient: Katherine Medina  Procedure(s) Performed: Procedure(s) (LRB): DILATATION AND CURETTAGE /HYSTEROSCOPY WITH ENDOMETRIAL BIOPSY (N/A)  Patient location during evaluation: PACU Anesthesia Type: General Level of consciousness: awake and alert Pain management: pain level controlled Vital Signs Assessment: post-procedure vital signs reviewed and stable Respiratory status: spontaneous breathing, nonlabored ventilation and respiratory function stable Cardiovascular status: blood pressure returned to baseline and stable Postop Assessment: no signs of nausea or vomiting Anesthetic complications: no    Last Vitals:  Vitals:   07/10/16 0931 07/10/16 0945  BP: 130/60 (!) 143/58  Pulse: 86 84  Resp: 17 16  Temp: 36.6 C 36.7 C    Last Pain:  Vitals:   07/10/16 0945  TempSrc:   PainSc: 5                  Kori Colin

## 2016-07-10 NOTE — Progress Notes (Signed)
Pt states she feels lousy   No pain no nausea

## 2016-07-10 NOTE — Anesthesia Preprocedure Evaluation (Signed)
Anesthesia Evaluation  Patient identified by MRN, date of birth, ID band Patient awake    Reviewed: Allergy & Precautions, NPO status , Patient's Chart, lab work & pertinent test results  History of Anesthesia Complications Negative for: history of anesthetic complications  Airway Mallampati: II  TM Distance: >3 FB Neck ROM: Full    Dental no notable dental hx.    Pulmonary neg pulmonary ROS, neg sleep apnea, neg COPD,    breath sounds clear to auscultation- rhonchi (-) wheezing      Cardiovascular Exercise Tolerance: Good (-) hypertension(-) CAD and (-) Past MI negative cardio ROS   Rhythm:Regular Rate:Normal - Systolic murmurs and - Diastolic murmurs    Neuro/Psych negative neurological ROS  negative psych ROS   GI/Hepatic negative GI ROS, Neg liver ROS, Ruptured appendicitis    Endo/Other  negative endocrine ROS  Renal/GU negative Renal ROS     Musculoskeletal negative musculoskeletal ROS (+)   Abdominal Normal abdominal exam  (+) - obese,   Peds  Hematology negative hematology ROS (+)   Anesthesia Other Findings Past Medical History: No date: Complication of anesthesia     Comment: BP dropped, Dr. Staci Acosta had to stop the               procedure. No date: Hypotension No date: Medical history non-contributory   Reproductive/Obstetrics negative OB ROS                             Anesthesia Physical  Anesthesia Plan  ASA: I  Anesthesia Plan: General   Post-op Pain Management:    Induction: Intravenous  Airway Management Planned: LMA  Additional Equipment:   Intra-op Plan:   Post-operative Plan:   Informed Consent: I have reviewed the patients History and Physical, chart, labs and discussed the procedure including the risks, benefits and alternatives for the proposed anesthesia with the patient or authorized representative who has indicated his/her understanding and  acceptance.   Dental advisory given  Plan Discussed with: Anesthesiologist and CRNA  Anesthesia Plan Comments:         Anesthesia Quick Evaluation

## 2016-07-10 NOTE — Op Note (Signed)
Operative Report Hysteroscopy, Dilation and Curettage 07/10/2016  Patient:  Katherine Medina  82 y.o. female Preoperative diagnosis:  Thickened Endometrium Postoperative diagnosis:  Thickened Endometrium  PROCEDURE:  Procedure(s): DILATATION AND CURETTAGE /HYSTEROSCOPY WITH ENDOMETRIAL BIOPSY (N/A) Surgeon:  Surgeon(s) and Role:    * Jahsiah Carpenter C Kearia Yin, MD - Primary Anesthesia:  LMA I/O: Total I/O In: 700 [I.V.:700] Out: - 10 EBL, no UOP Specimens:  Endometrial biopsy Complications: None Apparent Disposition:  VS stable to PACU  Findings: Stage 3 prolapse  Uterus, mobile, small, sounding to 6 cm; Cervix undefined  Pedunculated polyp and what appeared to be a thick uterine septum Two straw colored braided and knotted suture  Indication for procedure/Consents: 82 y.o. [id number]  here for scheduled surgery for the aforementioned diagnoses.  She had an incidental finding of a thickened endometrium of 1.3cm, heterogeneous, and recently diagnosed with colon cancer.  No vaginal bleeding. In-office endometrial biopsy was unsuccessful due to inability to pass the pipelle.  Cytotec 23mcg was given bucally prior to the surgery.  Risks of surgery were discussed with the patient including but not limited to: bleeding which may require transfusion; infection which may require antibiotics; injury to uterus or surrounding organs; intrauterine scarring which may impair future fertility; need for additional procedures including laparotomy or laparoscopy; and other postoperative/anesthesia complications. Written informed consent was obtained.    Procedure Details:   The patient was then taken to the operating room where anesthesia was administered and was found to be adequate.  After a formal and adequate timeout was performed, she was placed in the dorsal lithotomy position and examined with the above findings. She was then prepped and draped in the sterile manner.  An Alis clamp was applied to the anterior  and posterior lips of the cervix.  A surgeon's finger was placed in the rectum and remained there throughout the case to help identify structures and to ensure no injury to the bowel.  Lacrimal dilators were used to identify an orifice that was then expanded using pratt dilators.  The myoscope was then inserted and an anterior polyp was identified and what appeared to be a septum. A sharp curette was inserted into the uterus but immediately gave way.  Further attempts of curettage were abandoned.  An endometrial pipelle was then used to obtain a sample of tissue. The specimen was handed off to nursing. The patient tolerated the procedure well and was taken to the recovery area awake, extubated and in stable condition.  The patient will be discharged to home as per PACU criteria.  Routine postoperative instructions given. She will follow up in the clinic in two to four weeks for postoperative evaluation.  Larey Days, MD [facility] OBGYN Attending Gynecologist

## 2016-07-10 NOTE — Transfer of Care (Signed)
Immediate Anesthesia Transfer of Care Note  Patient: NIKOLA RENCK  Procedure(s) Performed: Procedure(s): DILATATION AND CURETTAGE /HYSTEROSCOPY WITH ENDOMETRIAL BIOPSY (N/A)  Patient Location: PACU  Anesthesia Type:General  Level of Consciousness: patient cooperative and responds to stimulation  Airway & Oxygen Therapy: Patient Spontanous Breathing and Patient connected to nasal cannula oxygen  Post-op Assessment: Report given to RN and Post -op Vital signs reviewed and stable  Post vital signs: Reviewed and stable  Last Vitals:  Vitals:   07/10/16 0708 07/10/16 0845  BP:  (!) 135/54  Pulse:  84  Resp:  14  Temp: 37.2 C 36.5 C    Last Pain:  Vitals:   07/10/16 0845  TempSrc:   PainSc: 0-No pain         Complications: No apparent anesthesia complications

## 2016-07-13 LAB — SURGICAL PATHOLOGY

## 2016-07-24 ENCOUNTER — Encounter: Payer: Self-pay | Admitting: Surgery

## 2016-07-24 ENCOUNTER — Ambulatory Visit
Admission: RE | Admit: 2016-07-24 | Discharge: 2016-07-24 | Disposition: A | Discharge status: Home / Self Care | Payer: Medicare Other | Source: Ambulatory Visit | Attending: Surgery | Admitting: Surgery

## 2016-07-24 ENCOUNTER — Other Ambulatory Visit
Admission: RE | Admit: 2016-07-24 | Discharge: 2016-07-24 | Disposition: A | Discharge status: Home / Self Care | Payer: Medicare Other | Source: Ambulatory Visit | Attending: Surgery | Admitting: Surgery

## 2016-07-24 ENCOUNTER — Ambulatory Visit (INDEPENDENT_AMBULATORY_CARE_PROVIDER_SITE_OTHER): Payer: Medicare Other | Admitting: Surgery

## 2016-07-24 VITALS — BP 165/77 | HR 86 | Temp 98.1°F | Ht 65.0 in | Wt 139.0 lb

## 2016-07-24 DIAGNOSIS — C182 Malignant neoplasm of ascending colon: Secondary | ICD-10-CM | POA: Insufficient documentation

## 2016-07-24 DIAGNOSIS — Z01810 Encounter for preprocedural cardiovascular examination: Secondary | ICD-10-CM | POA: Insufficient documentation

## 2016-07-24 DIAGNOSIS — K449 Diaphragmatic hernia without obstruction or gangrene: Secondary | ICD-10-CM | POA: Insufficient documentation

## 2016-07-24 LAB — COMPREHENSIVE METABOLIC PANEL
ALBUMIN: 4.2 g/dL (ref 3.5–5.0)
ALT: 14 U/L (ref 14–54)
ANION GAP: 8 (ref 5–15)
AST: 26 U/L (ref 15–41)
Alkaline Phosphatase: 63 U/L (ref 38–126)
BILIRUBIN TOTAL: 0.4 mg/dL (ref 0.3–1.2)
BUN: 17 mg/dL (ref 6–20)
CHLORIDE: 105 mmol/L (ref 101–111)
CO2: 27 mmol/L (ref 22–32)
Calcium: 9.3 mg/dL (ref 8.9–10.3)
Creatinine, Ser: 0.73 mg/dL (ref 0.44–1.00)
GFR calc Af Amer: >60 mL/min (ref >60)
Glucose, Bld: 109 mg/dL — ABNORMAL HIGH (ref 65–99)
POTASSIUM: 3.4 mmol/L — AB (ref 3.5–5.1)
Sodium: 140 mmol/L (ref 135–145)
TOTAL PROTEIN: 8.1 g/dL (ref 6.5–8.1)

## 2016-07-24 LAB — CBC WITH DIFFERENTIAL/PLATELET
BASOS PCT: 1 %
Basophils Absolute: 0.1 10*3/uL (ref 0–0.1)
EOS PCT: 0 %
Eosinophils Absolute: 0 10*3/uL (ref 0–0.7)
HEMATOCRIT: 42.7 % (ref 35.0–47.0)
Hemoglobin: 14 g/dL (ref 12.0–16.0)
Lymphocytes Relative: 17 %
Lymphs Abs: 1.5 10*3/uL (ref 1.0–3.6)
MCH: 27.4 pg (ref 26.0–34.0)
MCHC: 32.8 g/dL (ref 32.0–36.0)
MCV: 83.6 fL (ref 80.0–100.0)
MONO ABS: 0.5 10*3/uL (ref 0.2–0.9)
MONOS PCT: 6 %
NEUTROS ABS: 7 10*3/uL — AB (ref 1.4–6.5)
Neutrophils Relative %: 76 %
PLATELETS: 193 10*3/uL (ref 150–440)
RBC: 5.11 MIL/uL (ref 3.80–5.20)
RDW: 14.9 % — AB (ref 11.5–14.5)
WBC: 9.1 10*3/uL (ref 3.6–11.0)

## 2016-07-24 MED ORDER — ERYTHROMYCIN BASE 500 MG PO TABS: 1000.0000 mg | ORAL_TABLET | Freq: Three times a day (TID) | ORAL | 0 refills | Status: DC

## 2016-07-24 MED ORDER — POLYETHYLENE GLYCOL 3350 17 GM/SCOOP PO POWD: 1.0000 | Freq: Once | ORAL | 0 refills | Status: AC

## 2016-07-24 MED ORDER — NEOMYCIN SULFATE 500 MG PO TABS: 1000.0000 mg | ORAL_TABLET | Freq: Three times a day (TID) | ORAL | 0 refills | Status: DC

## 2016-07-24 MED ORDER — BISACODYL 5 MG PO TBEC: 20.0000 mg | DELAYED_RELEASE_TABLET | Freq: Once | ORAL | 0 refills | Status: AC

## 2016-07-24 NOTE — Patient Instructions (Addendum)
Today we need you to go to the [facility] and have your Chest X-Ray done and labs. Your Ct Scan is scheduled to be on 07/31/2016 at 8:30 AM off of [employer]. Remember to arrive at 8:15 AM and to be fasting.  We have seen you today to speak about removing a portion of your Colon to remove the cancer. We will arrange for this surgery to be done on 08/20/16 at [facility] by Dr. Dahlia Byes. Dr. Leonides Schanz will also be in the room during this surgery. (We have arranged this with her office)  You will need to complete a bowel prep prior to your surgery, please see the information sheet provided today for your directions.  Also, there will be 2 different antibiotics that you will need to take the day of your bowel prep: Neomycin and Erythromycin. You will take 2 tablets of each medication 3 times on the day of your bowel prep- 8am, 2pm, and 8pm.  Please see the (blue) Pre-care sheet for the details about your scheduled surgery.    Laparoscopic Colectomy Laparoscopic colectomy is surgery to remove part or all of the large intestine (colon). This procedure is used to treat several conditions, including:  Inflammation and infection of the colon (diverticulitis).  Tumors or masses in the colon.  Inflammatory bowel disease, such as Crohn disease or ulcerative colitis. Colectomy is an option when symptoms cannot be controlled with medicines.  Bleeding from the colon that cannot be controlled by another method.  Blockage or obstruction of the colon. LET [facility] CARE PROVIDER KNOW ABOUT:  Any allergies you have.  All medicines you are taking, including vitamins, herbs, eye drops, creams, and over-the-counter medicines.  Previous problems you or members of your family have had with the use of anesthetics.  Any blood disorders you have.  Previous surgeries you have had.  Medical conditions you have. RISKS AND COMPLICATIONS Generally, this is a safe procedure. However, as with any procedure,  complications can occur. Possible complications include:  Infection.  Bleeding.  Damage to other organs.  Leaking from where the colon was sewn together.  Future blockage of the small intestines from scar tissue. Another surgery may be needed to repair this. In some cases, complications such as damage to other organs or excessive bleeding may require the surgeon to convert from a laparoscopic procedure to an open procedure. This involves making a larger incision in the abdomen to perform the procedure. BEFORE THE PROCEDURE  Ask your health care provider about changing or stopping any regular medicines.  You may be prescribed an oral bowel prep. This involves drinking a large amount of medicated liquid, starting the day before your surgery. The liquid will cause you to have multiple loose stools until your stool is almost clear or light green. This cleans out your colon in preparation for the surgery.  Do not eat or drink anything else once you have started the bowel prep, unless your health care provider tells you it is safe to do so.  You may also be given antibiotic pills to clean out your colon of bacteria. Be sure to follow the directions carefully and take the medicine at the correct time. PROCEDURE   Small monitors will be put on your body. They are used to check your heart, blood pressure, and oxygen level.  An IV access tube will be put into one of your veins. Medicine will be able to flow directly into your body through this IV tube.  You might be  given a medicine to help you relax (sedative).  You will be given a medicine to make you sleep through the procedure (general anesthetic). A breathing tube may be placed into your lungs during the procedure.  A thin, flexible tube (catheter) will be placed into your bladder to collect urine.  A tube may be put in through your nose. It is called a nasogastric tube. It is used to remove stomach fluids after surgery until the  intestines start working again.  Your abdomen will be filled with air so that it expands. This gives the surgeon more room to operate and makes your organs easier to see.  Several small cuts (incisions) are made in your abdomen.  A thin, lighted tube with a tiny camera on the end (laparoscope) is put through one of the small incisions. The camera on the laparoscope sends a picture to a TV screen in the operating room. This gives the surgeon a good view inside your abdomen.  Hollow tubes are put through the other small incisions in your abdomen. The tools needed for the procedure are put through these tubes.  Clamps or staples are put on both ends of the diseased part of the colon.  The part of the intestine between the clamps or staples is removed.  If possible, the ends of the healthy colon that remain will be stitched or stapled together to allow your body to expel waste (stool).  Sometimes, the remaining colon cannot be stitched back together. If this is the case, a colostomy is needed. For a colostomy:  An opening (stoma) to the outside of your body is made through the abdomen.  The end of the colon is brought to the opening. It is stitched to the skin.  A bag is attached to the opening. Stool will drain into this bag. The bag is removable.  The colostomy can be temporary or permanent.  The incisions from the colectomy are closed with stitches or staples. AFTER THE PROCEDURE  You will be monitored closely in a recovery area until you are stable and doing well. You will then be moved to a regular hospital room.  You will need to receive fluids through an IV tube until your bowel function has returned. This may take 1-3 days. Once your bowels are working again, you will be started on clear liquids and then advanced to solid food as tolerated.  You will be given pain medicines to control your pain.   This information is not intended to replace advice given to you by your health  care provider. Make sure you discuss any questions you have with your health care provider.   Document Released: 11/21/2002 Document Revised: 06/21/2013 Document Reviewed: 04/12/2013 Elsevier Interactive Patient Education [employer].

## 2016-07-24 NOTE — Progress Notes (Signed)
Patient ID: Katherine Medina, female   DOB: July 10, 1929, 82 y.o.   MRN: [id number]  HPI Katherine Medina is a 82 y.o. female L known to me status post appendectomy back in July the appendectomy pathology revealed as rate melanoma therefore I refer to GI for further colonoscopy. She did have a recent colonoscopy showing evidence of a cecal mass consistent with adenocarcinoma eyedrops improvement. She did have an another polyp on the proximal descending colon consistent with sessile serrated adenoma without evidence of cancer or dysplasia. She has good cardiovascular performance able to perform more than 4 Mets of activity any chest pain or shortness of breath. She has recovered very well from her appendectomy. Of note she had some uterine concerns and was seen by Dr. Leonides Medina and she is concerned about many syndrome and there is a need for total abdominal hysterectomy with bilateral salpingo-oophorectomy. Given this situation I have discussed the case in detail with Dr. Leonides Medina and we will do a combined colectomy as well as a TAH/BSO in the same setting.   HPI  Past Medical History:  Diagnosis Date  . Complication of anesthesia    BP dropped, Dr. Staci Medina had to stop the procedure.  . Hypotension   . Medical history non-contributory     Past Surgical History:  Procedure Laterality Date  . [place]  . COLONOSCOPY    . COLONOSCOPY WITH PROPOFOL N/A 06/30/2016   Procedure: COLONOSCOPY WITH PROPOFOL;  Surgeon: Katherine Sails, MD;  Location: [facility] ENDOSCOPY;  Service: Endoscopy;  Laterality: N/A;  . HYSTEROSCOPY W/D&C N/A 07/10/2016   Procedure: DILATATION AND CURETTAGE /HYSTEROSCOPY WITH ENDOMETRIAL BIOPSY;  Surgeon: Katherine Loh Ward, MD;  Location: [facility] ORS;  Service: Gynecology;  Laterality: N/A;  . LAPAROSCOPIC APPENDECTOMY N/A 04/01/2016   Procedure: APPENDECTOMY LAPAROSCOPIC;  Surgeon: Katherine Husbands, MD;  Location: [facility] ORS;  Service: General;  Laterality: N/A;    Family History   Problem Relation Age of Onset  . Diabetes Mother   . Hypertension Father   . Colon cancer Neg Hx     Social History Social History  Substance Use Topics  . Smoking status: Never Smoker  . Smokeless tobacco: Never Used  . Alcohol use No    No Known Allergies  Current Outpatient Prescriptions  Medication Sig Dispense Refill  . Calcium Carbonate-Vitamin D (CALTRATE 600+D PO) Take 1 tablet by mouth daily.    . misoprostol (CYTOTEC) 200 MCG tablet Take 200 mcg by mouth 4 (four) times daily.    . bisacodyl (DULCOLAX) 5 MG EC tablet Take 4 tablets (20 mg total) by mouth once. 4 tablet 0  . erythromycin base (E-MYCIN) 500 MG tablet Take 2 tablets (1,000 mg total) by mouth 3 (three) times daily. 6 tablet 0  . neomycin (MYCIFRADIN) 500 MG tablet Take 2 tablets (1,000 mg total) by mouth 3 (three) times daily. 6 tablet 0  . polyethylene glycol powder (GLYCOLAX/MIRALAX) powder Take 255 g by mouth once. 255 g 0   No current facility-administered medications for this visit.      Review of Systems A 10 point review of systems was asked and was negative except for the information on the HPI  Physical Exam Blood pressure (!) 165/77, pulse 86, temperature 98.1 F (36.7 C), temperature source Oral, height 5\' 5"  (1.651 m), weight 63 kg (139 lb). CONSTITUTIONAL: NAD well nourished EYES: Pupils are equal, round, and reactive to light, Sclera are non-icteric. EARS, NOSE, MOUTH AND THROAT: The oropharynx  is clear. The oral mucosa is pink and moist. Hearing is intact to voice. LYMPH NODES:  Lymph nodes in the neck are normal. RESPIRATORY:  Lungs are clear. There is normal respiratory effort, with equal breath sounds bilaterally, and without pathologic use of accessory muscles. CARDIOVASCULAR: Heart is regular without murmurs, gallops, or rubs. GI: The abdomen is  soft, nontender, and nondistended. There are no palpable masses. There is no hepatosplenomegaly. There are normal bowel sounds in all  quadrants. GU: Rectal deferred.   MUSCULOSKELETAL: Normal muscle strength and tone. No cyanosis or edema.   SKIN: Turgor is good and there are no pathologic skin lesions or ulcers. NEUROLOGIC: Motor and sensation is grossly normal. Cranial nerves are grossly intact. PSYCH:  Oriented to person, place and time. Affect is normal.  Data Reviewed  I have personally reviewed the patient's imaging, laboratory findings and medical records.    Assessment/Plan [name] diagnosis adenocarcinoma of the cecum as is here with multiple serrated adenomas all on the right side. She is in need for a right hemicolectomy. In addition to this she does need a hysterectomy and we will go ahead and schedule her in the same setting. We'll have to coordinate this with Dr. Leonides Medina and to perform those 2 major surgeries in the same operative setting. The meantime I will go ahead and order a CEA as well as a CT scan of the abdomen and pelvis for staging purposes. I have discussed with the patient in detail about the operation, risk, benefits and possible complications including but not limited to: Bleeding, anastomotic leak, potential need for colostomy, possible open, possible injury to the ureters as well as prolonged hospitalization and pain. She understands and wishes to proceed. I will await for further diagnostic workup and we'll tentatively put her on the schedule next few weeks and follow with Dr. Leonides Medina. Extensive counseling provided. I Spent more than 45 minutes in this encounter with the majority of time spent in counseling and coordination of care.   Katherine Hamman, MD FACS General Surgeon 07/24/2016, 3:26 PM

## 2016-07-25 LAB — CEA: CEA: 0.2 ng/mL (ref 0.0–4.7)

## 2016-07-27 ENCOUNTER — Telehealth: Payer: Self-pay | Admitting: Surgery

## 2016-07-27 NOTE — Telephone Encounter (Signed)
Pt advised of pre op date/time and sx date. Sx: 08/20/16 with Dr Dahlia Byes, Dr Genevive Bi assisting--Laparoscopic  (hand assisted) right hemicolectomy, Possible open. Dr Leonides Schanz will start the case--hysterectomy. Anderson Malta (surgery scheduler) from Dr Tera Helper office was contacted and this has been confirmed.  Pre op: 08/10/16 @ 1:00pm--Office.   Patient made aware to call [phone], between 1-3:00pm the day before surgery, to find out what time to arrive.

## 2016-07-31 ENCOUNTER — Ambulatory Visit
Admission: RE | Admit: 2016-07-31 | Discharge: 2016-07-31 | Disposition: A | Discharge status: Home / Self Care | Payer: Medicare Other | Source: Ambulatory Visit | Attending: Surgery | Admitting: Surgery

## 2016-07-31 DIAGNOSIS — I700 Atherosclerosis of aorta: Secondary | ICD-10-CM | POA: Insufficient documentation

## 2016-07-31 DIAGNOSIS — C182 Malignant neoplasm of ascending colon: Secondary | ICD-10-CM

## 2016-07-31 DIAGNOSIS — K5730 Diverticulosis of large intestine without perforation or abscess without bleeding: Secondary | ICD-10-CM | POA: Diagnosis not present

## 2016-07-31 DIAGNOSIS — K429 Umbilical hernia without obstruction or gangrene: Secondary | ICD-10-CM | POA: Insufficient documentation

## 2016-07-31 LAB — SURGICAL PATHOLOGY

## 2016-07-31 MED ORDER — IOPAMIDOL (ISOVUE-300) INJECTION 61%
100.0000 mL | Freq: Once | INTRAVENOUS | Status: AC | PRN
Start: 2016-07-31 — End: 2016-07-31
  Administered 2016-07-31: 100 mL via INTRAVENOUS

## 2016-08-04 ENCOUNTER — Encounter: Payer: Self-pay | Admitting: Obstetrics & Gynecology

## 2016-08-05 ENCOUNTER — Telehealth: Payer: Self-pay

## 2016-08-05 NOTE — Telephone Encounter (Signed)
Called patient to let her know that her CT Scan did not show any cancer spread. I also told her that she was good to go for her surgery. Patient did not have any questions.

## 2016-08-05 NOTE — Telephone Encounter (Signed)
-----   Message from Jules Husbands, MD sent at 08/03/2016  3:23 PM EST ----- Please let the pt know that her CT scan did not show any cancer spread. No new changes for the upcoming surgery. Thx

## 2016-08-10 ENCOUNTER — Encounter
Admission: RE | Admit: 2016-08-10 | Discharge: 2016-08-10 | Disposition: A | Discharge status: Home / Self Care | Payer: Medicare Other | Source: Ambulatory Visit | Attending: Surgery | Admitting: Surgery

## 2016-08-10 DIAGNOSIS — C189 Malignant neoplasm of colon, unspecified: Secondary | ICD-10-CM | POA: Diagnosis not present

## 2016-08-10 DIAGNOSIS — Z01812 Encounter for preprocedural laboratory examination: Secondary | ICD-10-CM | POA: Insufficient documentation

## 2016-08-10 LAB — TYPE AND SCREEN
ABO/RH(D): O NEG
Antibody Screen: NEGATIVE

## 2016-08-10 LAB — SURGICAL PCR SCREEN
MRSA, PCR: NEGATIVE
STAPHYLOCOCCUS AUREUS: NEGATIVE

## 2016-08-10 NOTE — Patient Instructions (Signed)
  Your procedure is scheduled on: 08/20/16 Thurs. Report to Day Surgery. To find out your arrival time please call [phone] between 1PM - 3PM on 08/19/16 Wed.  Remember: Instructions that are not followed completely may result in serious medical risk, up to and including death, or upon the discretion of your surgeon and anesthesiologist your surgery may need to be rescheduled.    __x__ 1. Do not eat food or drink liquids after midnight. No gum chewing or hard candies.     ____ 2. No Alcohol for 24 hours before or after surgery.   ____ 3. Do Not Smoke For 24 Hours Prior to Your Surgery.   ____ 4. Bring all medications with you on the day of surgery if instructed.    x____ 5. Notify your doctor if there is any change in your medical condition     (cold, fever, infections).       Do not wear jewelry, make-up, hairpins, clips or nail polish.  Do not wear lotions, powders, or perfumes. You may wear deodorant.  Do not shave 48 hours prior to surgery. Men may shave face and neck.  Do not bring valuables to the hospital.    [facility] is not responsible for any belongings or valuables.               Contacts, dentures or bridgework may not be worn into surgery.  Leave your suitcase in the car. After surgery it may be brought to your room.  For patients admitted to the hospital, discharge time is determined by your                treatment team.   Patients discharged the day of surgery will not be allowed to drive home.   Please read over the following fact sheets that you were given:      _x___ Take these medicines the morning of surgery with A SIP OF WATER:    1. NONE  Follow bowel prep as ordered per Dr. Dahlia Byes  2.   3.   4.  5.  6.  ____ Fleet Enema (as directed)   __x__ Use CHG Soap as directed  ____ Use inhalers on the day of surgery  ____ Stop metformin 2 days prior to surgery    ____ Take 1/2 of usual insulin dose the night before surgery and none on the morning  of surgery.   ____ Stop Coumadin/Plavix/aspirin on   _x___ Stop Anti-inflammatories on Tylenol   ____ Stop supplements until after surgery.    ____ Bring C-Pap to the hospital.

## 2016-08-10 NOTE — Pre-Procedure Instructions (Signed)
Dr. Marcello Moores notified of request for anesthesia consult.  No History of major medical concerns or medications.  Recent labs and ekg. No additional orders or need for visit.

## 2016-08-11 ENCOUNTER — Telehealth: Payer: Self-pay

## 2016-08-11 ENCOUNTER — Other Ambulatory Visit: Payer: Self-pay

## 2016-08-11 DIAGNOSIS — K635 Polyp of colon: Secondary | ICD-10-CM

## 2016-08-11 MED ORDER — POTASSIUM CHLORIDE CRYS ER 20 MEQ PO TBCR: 20.0000 meq | EXTENDED_RELEASE_TABLET | Freq: Two times a day (BID) | ORAL | 0 refills | Status: DC

## 2016-08-11 NOTE — H&P (Signed)
Patient ID: Katherine Medina is a 82 y.o. female presenting with Pre Op Consulting  on 08/11/2016  HPI: Katherine Medina has been recently diagnosed with colon cancer which was found to have mutations in both PMS2 and MLH1, which are correlated with Lynch Syndrome, and in the absence of systemic mutation evaluation, still warrant consideration for removal of ovaries, fallopian tubes, and uterus.  Since she is having surgery with Dr. Dahlia Byes for her colon, we agreed to a joint surgery for the TLH BSO as well.  She presents today in good health.     Workup:    EMBx: negative for atypia or malignancy TVUS: Uterus 7x3x3cm EE: 60m, heterogeneous, with internal doppler flow RO: not visualized LO: not visualized No free fluid  D&C was attempted in the OR but very difficulty due to previously undisclosed uterine surgery which still is unknown of what was done.      Past Medical History:  has a past medical history of Appendicitis; Colon polyp; and Serrated adenoma of colon, unspecified (06/30/2016).  Past Surgical History:  has a past surgical history that includes Colonoscopy (06/30/2016); Appendectomy (03/2016); Cesarean section; and Hysteroscopy (06/2016). Family History: family history is not on file. Social History:  reports that she has never smoked. She has never used smokeless tobacco. She reports that she does not drink alcohol or use drugs. OB/GYN History:          OB History    Gravida Para Term Preterm AB Living   _0 SAB TAB Ectopic Molar Multiple Live Births                    Allergies: has No Known Allergies. Medications:  Current Outpatient Prescriptions:  .  calcium carbonate 500 mg calcium (1,250 mg) tablet, Take by mouth., Disp: , Rfl:    Review of Systems: No SOB, no palpitations or chest pain, no new lower extremity edema, no nausea or vomiting or bowel or bladder complaints. See HPI for gyn specific ROS.   Exam:     BP 139/69    Pulse 79   Ht 154.9 cm (_1 )   Wt 63.1 kg (139 lb 3.2 oz)   BMI 26.30 kg/m   General: Patient is well-groomed, well-nourished, appears stated age in no acute distress  HEENT: head is atraumatic and normocephalic, trachea is midline, neck is supple with no palpable nodules  CV: Regular rhythm and normal heart rate, no murmur  Pulm: Clear to auscultation throughout lung fields with no wheezing, crackles, or rhonchi. No increased work of breathing  Abdomen: soft , no mass, non-tender, no rebound tenderness, no hepatomegaly  Pelvic: deferred  Psych:  Alert x3, judgement, mood wnl   Impression:   The primary encounter diagnosis was Pre-op exam. 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    Plan:   Benefits and risks to surgery: The proposed benefit of the surgery has been discussed with the patient. The possible risks include, but are not limited to: organ injury to the bowel , bladder, ureters, and major blood vessels and nerves. There is a possibility of additional surgeries resulting from these injuries. There is also the risk of blood transfusion and the need to receive blood products during or after the procedure which may rarely lead to HIV or Hepatitis C infection. There is a risk of developing  a deep venous thrombosis or a pulmonary embolism . There is the possibility of wound infection and also anesthetic complications, even the rare possibility of death. The patient understands these risks and wishes to proceed. All questions have been answered and the consent has been signed.   Will proceed with total laparoscopic hysterectomy, bilateral salpingo-oophorectomy, to be followed by hemicolectomy by Dr. Dahlia Byes.  I personally performed the service.    Rosan Calbert CRIST Stellan Vick, MD

## 2016-08-11 NOTE — Telephone Encounter (Signed)
Reviewed Pre-op labs. Potassium is 3.4. Per protocol, patient will be supplemented 5 days prior to surgery with 63meq BID. Potassium will be rechecked the morning of surgery.  Medication sent to patient's [facility] at this time.  Call made to patient. All instructions given to patient. She will take potassium from Saturday 12/2- Wednesday 12/6. Patient verbalizes understanding of all instructions.  Orders placed to recheck potassium the morning of surgery.

## 2016-08-20 ENCOUNTER — Inpatient Hospital Stay: Payer: Medicare Other | Admitting: Anesthesiology

## 2016-08-20 ENCOUNTER — Encounter
Admission: RE | Disposition: A | Discharge status: Home / Self Care | Payer: Self-pay | Source: Ambulatory Visit | Attending: Surgery

## 2016-08-20 ENCOUNTER — Inpatient Hospital Stay
Admission: RE | Admit: 2016-08-20 | Discharge: 2016-08-28 | DRG: 330 | Disposition: A | Discharge status: Home / Self Care | Payer: Medicare Other | Source: Ambulatory Visit | Attending: Surgery | Admitting: Surgery

## 2016-08-20 DIAGNOSIS — K567 Ileus, unspecified: Secondary | ICD-10-CM | POA: Diagnosis not present

## 2016-08-20 DIAGNOSIS — R339 Retention of urine, unspecified: Secondary | ICD-10-CM | POA: Diagnosis not present

## 2016-08-20 DIAGNOSIS — Z79899 Other long term (current) drug therapy: Secondary | ICD-10-CM | POA: Diagnosis not present

## 2016-08-20 DIAGNOSIS — R1110 Vomiting, unspecified: Secondary | ICD-10-CM

## 2016-08-20 DIAGNOSIS — C182 Malignant neoplasm of ascending colon: Principal | ICD-10-CM

## 2016-08-20 DIAGNOSIS — K3532 Acute appendicitis with perforation and localized peritonitis, without abscess: Secondary | ICD-10-CM

## 2016-08-20 DIAGNOSIS — C189 Malignant neoplasm of colon, unspecified: Secondary | ICD-10-CM | POA: Diagnosis present

## 2016-08-20 DIAGNOSIS — R262 Difficulty in walking, not elsewhere classified: Secondary | ICD-10-CM

## 2016-08-20 DIAGNOSIS — M6281 Muscle weakness (generalized): Secondary | ICD-10-CM

## 2016-08-20 DIAGNOSIS — Z0189 Encounter for other specified special examinations: Secondary | ICD-10-CM

## 2016-08-20 HISTORY — PX: PARTIAL COLECTOMY: SHX5273

## 2016-08-20 HISTORY — PX: COLON RESECTION: SHX5231

## 2016-08-20 HISTORY — PX: LAPAROSCOPIC HYSTERECTOMY: SHX1926

## 2016-08-20 LAB — CREATININE, SERUM
CREATININE: 0.93 mg/dL (ref 0.44–1.00)
GFR, EST NON AFRICAN AMERICAN: 54 mL/min — AB (ref >60)

## 2016-08-20 SURGERY — COLECTOMY, HAND-ASSISTED, LAPAROSCOPIC
Anesthesia: General | Site: Abdomen | Wound class: Clean Contaminated

## 2016-08-20 MED ORDER — ENOXAPARIN SODIUM 40 MG/0.4ML ~~LOC~~ SOLN
40.0000 mg | SUBCUTANEOUS | Status: DC
  Administered 2016-08-21 – 2016-08-22 (×2): 40 mg via SUBCUTANEOUS
  Filled 2016-08-20 (×2): qty 0.4

## 2016-08-20 MED ORDER — MORPHINE SULFATE (PF) 4 MG/ML IV SOLN
2.0000 mg | INTRAVENOUS | Status: DC | PRN
  Administered 2016-08-24: 2 mg via INTRAVENOUS
  Filled 2016-08-20: qty 1

## 2016-08-20 MED ORDER — DIPHENHYDRAMINE HCL 50 MG/ML IJ SOLN: 12.5000 mg | Freq: Four times a day (QID) | INTRAMUSCULAR | Status: DC | PRN

## 2016-08-20 MED ORDER — HEPARIN SODIUM (PORCINE) 5000 UNIT/ML IJ SOLN
5000.0000 [IU] | Freq: Once | INTRAMUSCULAR | Status: AC
  Administered 2016-08-20: 5000 [IU] via SUBCUTANEOUS

## 2016-08-20 MED ORDER — BUPIVACAINE HCL 0.5 % IJ SOLN
INTRAMUSCULAR | Status: DC | PRN
  Administered 2016-08-20: 30 mL

## 2016-08-20 MED ORDER — CHLORHEXIDINE GLUCONATE CLOTH 2 % EX PADS
6.0000 | MEDICATED_PAD | Freq: Once | CUTANEOUS | Status: AC
  Administered 2016-08-20: 6 via TOPICAL

## 2016-08-20 MED ORDER — FENTANYL CITRATE (PF) 100 MCG/2ML IJ SOLN
INTRAMUSCULAR | Status: AC
  Filled 2016-08-20: qty 2

## 2016-08-20 MED ORDER — ACETAMINOPHEN 10 MG/ML IV SOLN
INTRAVENOUS | Status: AC
  Filled 2016-08-20: qty 100

## 2016-08-20 MED ORDER — ROCURONIUM BROMIDE 100 MG/10ML IV SOLN
INTRAVENOUS | Status: DC | PRN
  Administered 2016-08-20: 20 mg via INTRAVENOUS
  Administered 2016-08-20: 5 mg via INTRAVENOUS
  Administered 2016-08-20: 45 mg via INTRAVENOUS

## 2016-08-20 MED ORDER — KETOROLAC TROMETHAMINE 15 MG/ML IJ SOLN
15.0000 mg | Freq: Four times a day (QID) | INTRAMUSCULAR | Status: DC
  Administered 2016-08-20 – 2016-08-22 (×8): 15 mg via INTRAVENOUS
  Filled 2016-08-20 (×8): qty 1

## 2016-08-20 MED ORDER — FENTANYL CITRATE (PF) 100 MCG/2ML IJ SOLN
INTRAMUSCULAR | Status: DC | PRN
  Administered 2016-08-20: 25 ug via INTRAVENOUS
  Administered 2016-08-20: 50 ug via INTRAVENOUS
  Administered 2016-08-20 (×2): 25 ug via INTRAVENOUS
  Administered 2016-08-20: 50 ug via INTRAVENOUS
  Administered 2016-08-20: 100 ug via INTRAVENOUS
  Administered 2016-08-20: 25 ug via INTRAVENOUS

## 2016-08-20 MED ORDER — ETOMIDATE 2 MG/ML IV SOLN
INTRAVENOUS | Status: DC | PRN
  Administered 2016-08-20: 14 mg via INTRAVENOUS

## 2016-08-20 MED ORDER — ACETAMINOPHEN 10 MG/ML IV SOLN
INTRAVENOUS | Status: DC | PRN
  Administered 2016-08-20: 1000 mg via INTRAVENOUS

## 2016-08-20 MED ORDER — HEPARIN SODIUM (PORCINE) 5000 UNIT/ML IJ SOLN
INTRAMUSCULAR | Status: AC
  Administered 2016-08-20: 5000 [IU] via SUBCUTANEOUS
  Filled 2016-08-20: qty 1

## 2016-08-20 MED ORDER — ONDANSETRON HCL 4 MG/2ML IJ SOLN
4.0000 mg | Freq: Once | INTRAMUSCULAR | Status: AC | PRN
  Administered 2016-08-20: 4 mg via INTRAVENOUS

## 2016-08-20 MED ORDER — DIPHENHYDRAMINE HCL 12.5 MG/5ML PO ELIX: 12.5000 mg | ORAL_SOLUTION | Freq: Four times a day (QID) | ORAL | Status: DC | PRN

## 2016-08-20 MED ORDER — CHLORHEXIDINE GLUCONATE CLOTH 2 % EX PADS: 6.0000 | MEDICATED_PAD | Freq: Once | CUTANEOUS | Status: DC

## 2016-08-20 MED ORDER — BUPIVACAINE HCL (PF) 0.5 % IJ SOLN
INTRAMUSCULAR | Status: AC
  Filled 2016-08-20: qty 30

## 2016-08-20 MED ORDER — SUCCINYLCHOLINE CHLORIDE 20 MG/ML IJ SOLN
INTRAMUSCULAR | Status: DC | PRN
  Administered 2016-08-20: 80 mg via INTRAVENOUS

## 2016-08-20 MED ORDER — ACETAMINOPHEN 500 MG PO TABS
1000.0000 mg | ORAL_TABLET | Freq: Four times a day (QID) | ORAL | Status: DC
  Administered 2016-08-20 – 2016-08-22 (×8): 1000 mg via ORAL
  Filled 2016-08-20 (×9): qty 2

## 2016-08-20 MED ORDER — ONDANSETRON HCL 4 MG/2ML IJ SOLN
4.0000 mg | Freq: Four times a day (QID) | INTRAMUSCULAR | Status: DC | PRN
  Filled 2016-08-20: qty 2

## 2016-08-20 MED ORDER — DEXAMETHASONE SODIUM PHOSPHATE 10 MG/ML IJ SOLN
INTRAMUSCULAR | Status: DC | PRN
  Administered 2016-08-20: 4 mg via INTRAVENOUS

## 2016-08-20 MED ORDER — FENTANYL CITRATE (PF) 100 MCG/2ML IJ SOLN
INTRAMUSCULAR | Status: AC
Start: 2016-08-20 — End: 2016-08-21
  Filled 2016-08-20: qty 2

## 2016-08-20 MED ORDER — ETOMIDATE 2 MG/ML IV SOLN
INTRAVENOUS | Status: AC
  Filled 2016-08-20: qty 10

## 2016-08-20 MED ORDER — NEOSTIGMINE METHYLSULFATE 10 MG/10ML IV SOLN
INTRAVENOUS | Status: DC | PRN
  Administered 2016-08-20: 3 mg via INTRAVENOUS

## 2016-08-20 MED ORDER — DEXTROSE IN LACTATED RINGERS 5 % IV SOLN
INTRAVENOUS | Status: DC
  Administered 2016-08-20 – 2016-08-25 (×13): via INTRAVENOUS

## 2016-08-20 MED ORDER — ALUM & MAG HYDROXIDE-SIMETH 200-200-20 MG/5ML PO SUSP
30.0000 mL | Freq: Four times a day (QID) | ORAL | Status: DC | PRN
  Administered 2016-08-21: 30 mL via ORAL
  Filled 2016-08-20: qty 30

## 2016-08-20 MED ORDER — FENTANYL CITRATE (PF) 100 MCG/2ML IJ SOLN
25.0000 ug | INTRAMUSCULAR | Status: AC | PRN
  Administered 2016-08-20 (×6): 25 ug via INTRAVENOUS

## 2016-08-20 MED ORDER — ONDANSETRON HCL 4 MG/2ML IJ SOLN
INTRAMUSCULAR | Status: AC
  Filled 2016-08-20: qty 2

## 2016-08-20 MED ORDER — HYDROMORPHONE HCL 1 MG/ML IJ SOLN
0.2500 mg | INTRAMUSCULAR | Status: DC | PRN
  Administered 2016-08-20 (×4): 0.5 mg via INTRAVENOUS

## 2016-08-20 MED ORDER — SODIUM CHLORIDE 0.9 % IJ SOLN
INTRAMUSCULAR | Status: AC
  Filled 2016-08-20: qty 50

## 2016-08-20 MED ORDER — GABAPENTIN 100 MG PO CAPS
100.0000 mg | ORAL_CAPSULE | Freq: Three times a day (TID) | ORAL | Status: DC
  Administered 2016-08-20 – 2016-08-22 (×6): 100 mg via ORAL
  Filled 2016-08-20 (×6): qty 1

## 2016-08-20 MED ORDER — OXYCODONE-ACETAMINOPHEN 5-325 MG PO TABS
1.0000 | ORAL_TABLET | ORAL | Status: DC | PRN
  Filled 2016-08-20: qty 1

## 2016-08-20 MED ORDER — ERTAPENEM SODIUM 1 G IJ SOLR
1.0000 g | INTRAMUSCULAR | Status: AC
  Administered 2016-08-20: 1 g via INTRAVENOUS
  Filled 2016-08-20: qty 1

## 2016-08-20 MED ORDER — LIDOCAINE HCL (CARDIAC) 20 MG/ML IV SOLN
INTRAVENOUS | Status: DC | PRN
  Administered 2016-08-20: 60 mg via INTRAVENOUS

## 2016-08-20 MED ORDER — BUPIVACAINE-EPINEPHRINE (PF) 0.5% -1:200000 IJ SOLN
INTRAMUSCULAR | Status: AC
  Filled 2016-08-20: qty 10

## 2016-08-20 MED ORDER — ONDANSETRON HCL 4 MG PO TABS
4.0000 mg | ORAL_TABLET | Freq: Four times a day (QID) | ORAL | Status: DC | PRN
  Administered 2016-08-22: 4 mg via ORAL
  Filled 2016-08-20: qty 1

## 2016-08-20 MED ORDER — BUPIVACAINE LIPOSOME 1.3 % IJ SUSP
INTRAMUSCULAR | Status: AC
  Filled 2016-08-20: qty 20

## 2016-08-20 MED ORDER — GLYCOPYRROLATE 0.2 MG/ML IJ SOLN
INTRAMUSCULAR | Status: DC | PRN
  Administered 2016-08-20: 0.6 mg via INTRAVENOUS

## 2016-08-20 MED ORDER — PHENYLEPHRINE HCL 10 MG/ML IJ SOLN
INTRAMUSCULAR | Status: DC | PRN
  Administered 2016-08-20: 100 ug via INTRAVENOUS

## 2016-08-20 MED ORDER — LACTATED RINGERS IV SOLN
INTRAVENOUS | Status: DC
  Administered 2016-08-20: 08:00:00 via INTRAVENOUS

## 2016-08-20 MED ORDER — ONDANSETRON HCL 4 MG/2ML IJ SOLN
INTRAMUSCULAR | Status: DC | PRN
  Administered 2016-08-20: 4 mg via INTRAVENOUS

## 2016-08-20 MED ORDER — FAMOTIDINE 20 MG PO TABS
ORAL_TABLET | ORAL | Status: AC
  Administered 2016-08-20: 20 mg via ORAL
  Filled 2016-08-20: qty 1

## 2016-08-20 MED ORDER — FAMOTIDINE 20 MG PO TABS
20.0000 mg | ORAL_TABLET | Freq: Once | ORAL | Status: AC
  Administered 2016-08-20: 20 mg via ORAL

## 2016-08-20 MED ORDER — SODIUM CHLORIDE 0.9 % IV SOLN
INTRAVENOUS | Status: DC | PRN
Start: 2016-08-20 — End: 2016-08-20
  Administered 2016-08-20: 10:00:00 via INTRAVENOUS

## 2016-08-20 MED ORDER — HYDROMORPHONE HCL 1 MG/ML IJ SOLN
INTRAMUSCULAR | Status: AC
  Filled 2016-08-20: qty 2

## 2016-08-20 MED ORDER — SODIUM CHLORIDE 0.9 % IV SOLN
INTRAVENOUS | Status: DC | PRN
  Administered 2016-08-20: 60 mL

## 2016-08-20 SURGICAL SUPPLY — 98 items
ANCHOR TIS RET SYS 235ML (MISCELLANEOUS) ×3 IMPLANT
APPLIER CLIP 11 MED OPEN (CLIP) ×3
APPLIER CLIP 13 LRG OPEN (CLIP) ×3
BACTOSHIELD CHG 4% 4OZ (MISCELLANEOUS)
BAG URO DRAIN 2000ML W/SPOUT (MISCELLANEOUS) ×3 IMPLANT
BLADE CLIPPER SURG (BLADE) ×3 IMPLANT
BLADE SURG SZ10 CARB STEEL (BLADE) ×3 IMPLANT
BLADE SURG SZ11 CARB STEEL (BLADE) ×3 IMPLANT
CANISTER SUCT 1200ML W/VALVE (MISCELLANEOUS) ×3 IMPLANT
CATH FOLEY 2WAY  5CC 16FR (CATHETERS) ×1
CATH URTH 16FR FL 2W BLN LF (CATHETERS) ×2 IMPLANT
CHLORAPREP W/TINT 26ML (MISCELLANEOUS) ×6 IMPLANT
CLIP APPLIE 11 MED OPEN (CLIP) ×2 IMPLANT
CLIP APPLIE 13 LRG OPEN (CLIP) ×2 IMPLANT
DECANTER SPIKE VIAL GLASS SM (MISCELLANEOUS) ×3 IMPLANT
DEFOGGER SCOPE WARMER CLEARIFY (MISCELLANEOUS) ×3 IMPLANT
DERMABOND ADVANCED (GAUZE/BANDAGES/DRESSINGS) ×2
DERMABOND ADVANCED .7 DNX12 (GAUZE/BANDAGES/DRESSINGS) ×4 IMPLANT
DEVICE SUTURE ENDOST 10MM (ENDOMECHANICALS) IMPLANT
DRAPE INCISE IOBAN 66X45 STRL (DRAPES) ×3 IMPLANT
DRAPE LEGGINS SURG 28X43 STRL (DRAPES) ×3 IMPLANT
DRAPE SHEET LG 3/4 BI-LAMINATE (DRAPES) ×3 IMPLANT
DRAPE UNDER BUTTOCK W/FLU (DRAPES) ×3 IMPLANT
ELECT CAUTERY BLADE 6.4 (BLADE) ×3 IMPLANT
ELECT REM PT RETURN 9FT ADLT (ELECTROSURGICAL) ×3
ELECTRODE REM PT RTRN 9FT ADLT (ELECTROSURGICAL) ×2 IMPLANT
GAUZE SPONGE 4X4 12PLY STRL (GAUZE/BANDAGES/DRESSINGS) ×3 IMPLANT
GELPORT LAPAROSCOPIC (MISCELLANEOUS) IMPLANT
GLOVE BIO SURGEON STRL SZ7 (GLOVE) ×48 IMPLANT
GLOVE PI ORTHOPRO 6.5 (GLOVE) ×1
GLOVE PI ORTHOPRO STRL 6.5 (GLOVE) ×2 IMPLANT
GLOVE SURG SYN 6.5 ES PF (GLOVE) ×9 IMPLANT
GOWN STRL REUS W/ TWL LRG LVL3 (GOWN DISPOSABLE) ×10 IMPLANT
GOWN STRL REUS W/ TWL XL LVL3 (GOWN DISPOSABLE) ×2 IMPLANT
GOWN STRL REUS W/TWL LRG LVL3 (GOWN DISPOSABLE) ×5
GOWN STRL REUS W/TWL XL LVL3 (GOWN DISPOSABLE) ×1
HANDLE SUCTION POOLE (INSTRUMENTS) ×2 IMPLANT
HANDLE YANKAUER SUCT BULB TIP (MISCELLANEOUS) ×3 IMPLANT
HOLDER FOLEY CATH W/STRAP (MISCELLANEOUS) ×3 IMPLANT
IRRIGATION STRYKERFLOW (MISCELLANEOUS) IMPLANT
IRRIGATOR STRYKERFLOW (MISCELLANEOUS)
IV LACTATED RINGERS 1000ML (IV SOLUTION) ×3 IMPLANT
KIT PINK PAD W/HEAD ARE REST (MISCELLANEOUS) ×3
KIT PINK PAD W/HEAD ARM REST (MISCELLANEOUS) ×2 IMPLANT
KIT RM TURNOVER CYSTO AR (KITS) ×3 IMPLANT
L-HOOK LAP DISP 36CM (ELECTROSURGICAL) ×3
LHOOK LAP DISP 36CM (ELECTROSURGICAL) ×2 IMPLANT
LIGASURE BLUNT 5MM 37CM (INSTRUMENTS) ×3 IMPLANT
LIQUID BAND (GAUZE/BANDAGES/DRESSINGS) ×9 IMPLANT
MANIPULATOR VCARE LG CRV RETR (MISCELLANEOUS) IMPLANT
MANIPULATOR VCARE SML CRV RETR (MISCELLANEOUS) IMPLANT
MANIPULATOR VCARE STD CRV RETR (MISCELLANEOUS) IMPLANT
NEEDLE HYPO 22GX1.5 SAFETY (NEEDLE) ×3 IMPLANT
NS IRRIG 1000ML POUR BTL (IV SOLUTION) ×9 IMPLANT
NS IRRIG 500ML POUR BTL (IV SOLUTION) ×3 IMPLANT
PACK BASIN MAJOR ARMC (MISCELLANEOUS) ×3 IMPLANT
PACK COLON CLEAN CLOSURE (MISCELLANEOUS) ×3 IMPLANT
PACK LAP CHOLECYSTECTOMY (MISCELLANEOUS) ×6 IMPLANT
PAD OB MATERNITY 4.3X12.25 (PERSONAL CARE ITEMS) IMPLANT
PAD PREP 24X41 OB/GYN DISP (PERSONAL CARE ITEMS) ×3 IMPLANT
PENCIL ELECTRO HAND CTR (MISCELLANEOUS) ×6 IMPLANT
RELOAD BLUE (STAPLE) ×6 IMPLANT
RELOAD PROXIMATE 75MM BLUE (ENDOMECHANICALS) ×3 IMPLANT
RELOAD STAPLER WHITE 60MM (STAPLE) IMPLANT
RETRACTOR WOUND ALXS 18CM MED (MISCELLANEOUS) ×2 IMPLANT
RTRCTR WOUND ALEXIS O 18CM MED (MISCELLANEOUS) ×3
SCISSORS METZENBAUM CVD 33 (INSTRUMENTS) IMPLANT
SCRUB CHG 4% DYNA-HEX 4OZ (MISCELLANEOUS) IMPLANT
SET CYSTO W/LG BORE CLAMP LF (SET/KITS/TRAYS/PACK) IMPLANT
SHEARS HARMONIC ACE PLUS 36CM (ENDOMECHANICALS) ×3 IMPLANT
SLEEVE ENDOPATH XCEL 5M (ENDOMECHANICALS) ×3 IMPLANT
SPONGE LAP 18X18 5 PK (GAUZE/BANDAGES/DRESSINGS) ×6 IMPLANT
STAPLE ECHEON FLEX 60 POW ENDO (STAPLE) ×3 IMPLANT
STAPLER PROXIMATE 75MM BLUE (STAPLE) ×3 IMPLANT
STAPLER RELOAD WHITE 60MM (STAPLE)
SUCTION POOLE HANDLE (INSTRUMENTS) ×3
SURGILUBE 2OZ TUBE FLIPTOP (MISCELLANEOUS) ×3 IMPLANT
SUT ENDO VLOC 180-0-8IN (SUTURE) IMPLANT
SUT MNCRL 4-0 (SUTURE) ×2
SUT MNCRL 4-0 27XMFL (SUTURE) ×4
SUT PDS AB 0 CT1 27 (SUTURE) ×6 IMPLANT
SUT PDS AB 1 TP1 54 (SUTURE) ×3 IMPLANT
SUT SILK 2 0 (SUTURE) ×1
SUT SILK 2 0 SH CR/8 (SUTURE) ×3 IMPLANT
SUT SILK 2-0 30XBRD TIE 12 (SUTURE) ×2 IMPLANT
SUT SILK 3-0 (SUTURE) ×3 IMPLANT
SUT VIC AB 0 CT1 36 (SUTURE) ×3 IMPLANT
SUT VIC AB 2-0 UR6 27 (SUTURE) ×3 IMPLANT
SUT VICRYL 0 AB UR-6 (SUTURE) ×6 IMPLANT
SUTURE MNCRL 4-0 27XMF (SUTURE) ×4 IMPLANT
SYR 30ML LL (SYRINGE) ×3 IMPLANT
TRAY FOLEY W/METER SILVER 16FR (SET/KITS/TRAYS/PACK) IMPLANT
TROCAR BLUNT TIP 12MM OMST12BT (TROCAR) IMPLANT
TROCAR XCEL 12X100 BLDLESS (ENDOMECHANICALS) ×3 IMPLANT
TROCAR XCEL BLUNT TIP 100MML (ENDOMECHANICALS) ×3 IMPLANT
TROCAR XCEL NON-BLD 5MMX100MML (ENDOMECHANICALS) ×6 IMPLANT
TUBING INSUF HEATED (TUBING) ×3 IMPLANT
TUBING INSUFFLATOR HEATED (MISCELLANEOUS) ×6 IMPLANT

## 2016-08-20 NOTE — Anesthesia Preprocedure Evaluation (Signed)
Anesthesia Evaluation  Patient identified by MRN, date of birth, ID band Patient awake    Reviewed: Allergy & Precautions, H&P , NPO status , Patient's Chart, lab work & pertinent test results, reviewed documented beta blocker date and time   Airway Mallampati: II  TM Distance: >3 FB Neck ROM: full    Dental  (+) Teeth Intact   Pulmonary neg pulmonary ROS,    Pulmonary exam normal        Cardiovascular Exercise Tolerance: Good negative cardio ROS Normal cardiovascular exam Rhythm:regular Rate:Normal     Neuro/Psych negative neurological ROS  negative psych ROS   GI/Hepatic negative GI ROS, Neg liver ROS,   Endo/Other  negative endocrine ROS  Renal/GU negative Renal ROS  negative genitourinary   Musculoskeletal   Abdominal   Peds  Hematology negative hematology ROS (+)   Anesthesia Other Findings Past Medical History: No date: Complication of anesthesia     Comment: BP dropped, Dr. Staci Acosta had to stop the               procedure. No date: Hypotension No date: Medical history non-contributory Past Surgical History: 1962 and 1969: CESAREAN SECTION No date: COLONOSCOPY 06/30/2016: COLONOSCOPY WITH PROPOFOL N/A     Comment: Procedure: COLONOSCOPY WITH PROPOFOL;                Surgeon: Lollie Sails, MD;  Location: [facility]              ENDOSCOPY;  Service: Endoscopy;  Laterality:               N/A; No date: EYE SURGERY     Comment: cataract with IOL Bilateral 07/10/2016: HYSTEROSCOPY W/D&C N/A     Comment: Procedure: DILATATION AND CURETTAGE               /HYSTEROSCOPY WITH ENDOMETRIAL BIOPSY;                Surgeon: Honor Loh Ward, MD;  Location: [facility]               ORS;  Service: Gynecology;  Laterality: N/A; 04/01/2016: LAPAROSCOPIC APPENDECTOMY N/A     Comment: Procedure: APPENDECTOMY LAPAROSCOPIC;                Surgeon: Jules Husbands, MD;  Location: [facility]               ORS;  Service: General;   Laterality: N/A; BMI    Body Mass Index:  22.63 kg/m     Reproductive/Obstetrics negative OB ROS                             Anesthesia Physical Anesthesia Plan  ASA: II  Anesthesia Plan: General ETT   Post-op Pain Management:    Induction: Intravenous  Airway Management Planned:   Additional Equipment: Arterial line  Intra-op Plan:   Post-operative Plan:   Informed Consent: I have reviewed the patients History and Physical, chart, labs and discussed the procedure including the risks, benefits and alternatives for the proposed anesthesia with the patient or authorized representative who has indicated his/her understanding and acceptance.   Dental Advisory Given  Plan Discussed with: CRNA  Anesthesia Plan Comments: (Hx of intraprocedure hypotension during recent colonoscopy.  JA)        Anesthesia Quick Evaluation

## 2016-08-20 NOTE — Transfer of Care (Signed)
Immediate Anesthesia Transfer of Care Note  Patient: Katherine Medina  Procedure(s) Performed: Procedure(s): HAND ASSISTED LAPAROSCOPIC COLON RESECTION/ HEMI COLECTOMY (N/A) PARTIAL COLECTOMY (N/A) HYSTERECTOMY TOTAL LAPAROSCOPIC WITH BSO (N/A)  Patient Location: PACU  Anesthesia Type:General  Level of Consciousness: awake, alert  and responds to stimulation  Airway & Oxygen Therapy: Patient Spontanous Breathing and Patient connected to face mask oxygen  Post-op Assessment: Report given to RN and Post -op Vital signs reviewed and stable  Post vital signs: Reviewed and stable  Last Vitals:  Vitals:   08/20/16 0933 08/20/16 1347  BP:  (!) 141/60  Pulse:  91  Resp:  (!) 31  Temp: 36.9 C     Last Pain:  Vitals:   08/20/16 0933  TempSrc: Tympanic         Complications: No apparent anesthesia complications

## 2016-08-20 NOTE — Anesthesia Procedure Notes (Signed)
Procedure Name: Intubation Performed by: Haydn Cush Pre-anesthesia Checklist: Patient identified, Patient being monitored, Timeout performed, Emergency Drugs available and Suction available Patient Re-evaluated:Patient Re-evaluated prior to inductionOxygen Delivery Method: Circle system utilized Preoxygenation: Pre-oxygenation with 100% oxygen Intubation Type: IV induction Ventilation: Mask ventilation without difficulty Laryngoscope Size: Mac and 3 Grade View: Grade I Tube type: Oral Tube size: 7.0 mm Number of attempts: 1 Airway Equipment and Method: Stylet Placement Confirmation: ETT inserted through vocal cords under direct vision,  positive ETCO2 and breath sounds checked- equal and bilateral Secured at: 21 cm Tube secured with: Tape Dental Injury: Teeth and Oropharynx as per pre-operative assessment        

## 2016-08-20 NOTE — Op Note (Signed)
PROCEDURES: 1. Hand assisted Laparoscopic Right hemicolectomy   Pre-operative Diagnosis: Colon CA  Post-operative Diagnosis: same  Surgeon: Marjory Lies Dontavion Noxon   Assistants: Dr. Genevive Bi  Anesthesia: General endotracheal anesthesia  ASA Class: 2, 3   Surgeon: Caroleen Hamman , MD FACS  Anesthesia: Gen. with endotracheal tube  Findings: Multiple polyps on right colon and cecum, no evidence of metastatic disease  Estimated Blood Loss: 25cc         Drains: none         Specimens: Right colon          Complications: none                Condition: stable  Procedure Details  The patient was seen again in the Holding Room. The benefits, complications, treatment options, and expected outcomes were discussed with the patient. The risks of bleeding, infection, recurrence of symptoms, failure to resolve symptoms,  bowel injury, any of which could require further surgery were reviewed with the patient.   The patient was taken to Operating Room, identified as Katherine Medina and the procedure verified.  A Time Out was held and the above information confirmed.  Prior to the induction of general anesthesia, antibiotic prophylaxis was administered. VTE prophylaxis was in place. General endotracheal anesthesia was then administered and tolerated well. After the induction, the abdomen was prepped with Chloraprep and draped in the sterile fashion. The patient was positioned in lithotomy position by Dr. Leonides Schanz from OB/GYN. This was a combined Hysterectomy and right hemicolectomy. Dr. Leonides Schanz started the case and please see her operative note for further details. I was asked to come in at the end of her case so the Uterus could be removed via my Hand assisted incision. 7 cm incision was created as a midline mini laparotomy. The abdominal cavity was entered under direct visualization and the GelPort device was placed. Dr. Leonides Schanz removed her specimen.There were already three 5 mm lap ports that Dr. Leonides Schanz placed previously  and I used them to continue my part. There was significant adhesive disease from the cecum to the pelvis, adhesions were lysed with a combination of finger fracturing and ligasure device. The white line of pot was identified and divided and we mobilized  IN a lateral to medial fashion. We preserved the ureter at all times. We were also able to mobilize the hepatic flexure using in the standard fashion. Once we have an adequate mobilization of the colon and also mobilization of the omentum, for dissected the transverse colon from the duodenum in the standard fashion. We always make sure that we were careful not to injure the duodenum. After adequate mobilization of the colon was established we removed the GelPort and exteriorized the terminal ileum. We transected terminal ileum around 10 cm from the ileocecal valve using the standard 75 GIA stapler. We turned our attention to the distal resection margin which was to the right of the middle colic artery. We created a window in the mesentery and divided the transverse colon using a GIA stapler. We then were able to identify the main pedicle of the right colic artery under direct visualization and suture ligated with 2-0 silk sutures. Using the LigaSure device were able to further divide the mesentery the standard fashion.  A side-to-side functional end-to-end stapled anastomosis was created with 75 GIA stapler. There was no tension and there was no evidence of intraoperative leak.  All the laparoscopic ports were removed and a second look showed no evidence of any  bleeding or any other injuries. We changed gloves and place a new tray to close the abdomen with a 0 PDS suture in a running fashion and the skin was closed with 4-0 Monocryl. Liposomal Marcaine was injected on all incision sites under direct visualization. Dermabond was used to coat all the skin incisions. Needle and laparotomy count were correct and there were no immediate complications.  Caroleen Hamman, MD, FACS

## 2016-08-20 NOTE — Progress Notes (Signed)
Chaplain responded to an order from a nurse who is taking care of the patient. Patient and his family member wanted an Regulatory affairs officer. Chaplain provided education and a copy of AD to patient.

## 2016-08-20 NOTE — Op Note (Signed)
Total Laparoscopic Hysterectomy Operative Note Procedure Date: 08/20/2016  Patient:  Katherine Medina  82 y.o. female  PRE-OPERATIVE DIAGNOSIS:  colon cancer with MSI mutations  POST-OPERATIVE DIAGNOSIS:  same  PROCEDURE:  Procedure(s): HAND ASSISTED LAPAROSCOPIC COLON RESECTION/ HEMI COLECTOMY (N/A) PARTIAL COLECTOMY (N/A) HYSTERECTOMY TOTAL LAPAROSCOPIC WITH BSO (N/A)  SURGEON:  Surgeon(s) and Role: Panel 1:    * Diego F Dahlia Byes, MD - Primary    * Nestor Lewandowsky, MD - Assisting  Panel 2:    * Honor Loh Slaton Reaser, MD - Primary  ANESTHESIA:  General via ET  I/O  Total I/O In: 371 [P.O.:240; I.V.:302] Out: 75 [Urine:75]  FINDINGS:  Small uterus, normal ovaries and fallopian tubes bilaterally. Thin and translucent peritoneal tissues. Normal upper abdomen with some filmy adhesions visible but not further investigated.  Vagina had been surgically altered.    SPECIMEN: Uterus, Cervix, and bilateral fallopian tubes  COMPLICATIONS: none apparent  DISPOSITION: vital signs stable to PACU  Indication for Surgery: 82 y.o. [id number] who had been recently diagnosed with colon cancer, which showed MLH-1 and PMS-2 mutations, consistent with markers for Lynch syndrome.  Systemic genetic testing not performed.  On imaging the patient had thickened heterogeneous endometrium, but sampling was limited to due to unknown previous surgery.  The patient was advised of this and elected to have her uterus, cervix, tubes and ovaries removed at the same time as the colectomy by Dr. Dahlia Byes.     Risks of surgery were discussed with the patient including but not limited to: bleeding which may require transfusion or reoperation; infection which may require antibiotics; injury to bowel, bladder, ureters or other surrounding organs; need for additional procedures including laparotomy, blood clot, incisional problems and other postoperative/anesthesia complications. Written informed consent was obtained.      PROCEDURE IN  DETAIL:  The patient had 5000u Heparin Sub-q and sequential compression devices applied to her lower extremities while in the preoperative area.  She was then taken to the operating room where general anesthesia was administered via endotracheal route.  She was placed in the dorsal lithotomy position, and was prepped and draped in a sterile manner. A surgical time-out was performed.  A Foley catheter was inserted into her bladder and attached to constant drainage.  No identifiable cervix was noted in the vagina.  A tenaculum was placed on what was thought to be the cervical os.  The gloves were changed, and attention was turned to the abdomen where an umbilical incision was made with the scalpel. The underlying tissues were bluntly dissected open, and the peritoneum was entered. The [facility] balloon trochar was inserted.  Opening pressure was 6m Hg, and the abdomen was insufflated to 136mg carbon dioxide gas and adequate pneumoperitoneum was obtained.  A survey of the patient's pelvis and abdomen revealed the findings as mentioned above. Two 45m86morts were inserted in the lower left and right quadrants under visualization.  A third 45mm29mochar was placed suprapubically.  The bilateral infundibulopelvic ligaments were transected after identification of the bilateral ureters. The bilateral round ligaments were transected and anterior broad ligament divided and brought across the uterus to separate the vesicouterine peritoneum and create a bladder flap. The bladder was pushed away from the uterus. The bilateral uterine arteries were ligated and transected. The bilateral uterosacral and cardinal ligaments were ligated and transected. The parametria was divided further with the ligasure, and the decision was made to make a supracervical hysterectomy.  However, the distal end of the cervix was identified  and held with filmy fatty tissue separate from the vagina, thus this was transected and a total hysterectomy  was completed.  The vagina was never entered.  This was confirmed with the surgeon's finger. After a change of gloves, the pneumoperitoneum was deflated then recreated and surgical site inspected, and found to be hemostatic. Bilateral ureters were visualized vermiuclating. No intraoperative injury to surrounding organs was noted.  Dr. Dahlia Byes then scrubbed and created his hand-assist port through the supraumbilical trochar site.  The uterus was grasped and brought through the incision intact.  It was handed off to nursing to be sent to pathology.  At this time, Dr. Dahlia Byes and Dr. Genevive Bi continued with their portion of the case.  Please see his op note for details.  At the conclusion of the case, the patient was taken to the PACU in a stable condition.   ---- Larey Days, MD Attending Obstetrician and [facility]

## 2016-08-20 NOTE — Interval H&P Note (Signed)
History and Physical Interval Note:  08/20/2016 9:24 AM  Katherine Medina  has presented today for surgery, with the diagnosis of colon ca  The various methods of treatment have been discussed with the patient and family. After consideration of risks, benefits and other options for treatment, the patient has consented to  Procedure(s): HAND ASSISTED LAPAROSCOPIC COLON RESECTION/ HEMI COLECTOMY (N/A) PARTIAL COLECTOMY (N/A) HYSTERECTOMY TOTAL LAPAROSCOPIC WITH BSO (N/A) as a surgical intervention .  The patient's history has been reviewed, patient examined, no change in status, stable for surgery.  I have reviewed the patient's chart and labs.  Questions were answered to the patient's satisfaction.     [name]

## 2016-08-20 NOTE — H&P (View-Only) (Signed)
Patient ID: BEE ROTI, female   DOB: 08-Feb-1929, 82 y.o.   MRN: [id number]  HPI JASANI AKRIDGE is a 82 y.o. female L known to me status post appendectomy back in July the appendectomy pathology revealed as rate melanoma therefore I refer to GI for further colonoscopy. She did have a recent colonoscopy showing evidence of a cecal mass consistent with adenocarcinoma eyedrops improvement. She did have an another polyp on the proximal descending colon consistent with sessile serrated adenoma without evidence of cancer or dysplasia. She has good cardiovascular performance able to perform more than 4 Mets of activity any chest pain or shortness of breath. She has recovered very well from her appendectomy. Of note she had some uterine concerns and was seen by Dr. Leonides Schanz and she is concerned about many syndrome and there is a need for total abdominal hysterectomy with bilateral salpingo-oophorectomy. Given this situation I have discussed the case in detail with Dr. Leonides Schanz and we will do a combined colectomy as well as a TAH/BSO in the same setting.   HPI  Past Medical History:  Diagnosis Date  . Complication of anesthesia    BP dropped, Dr. Staci Acosta had to stop the procedure.  . Hypotension   . Medical history non-contributory     Past Surgical History:  Procedure Laterality Date  . [place]  . COLONOSCOPY    . COLONOSCOPY WITH PROPOFOL N/A 06/30/2016   Procedure: COLONOSCOPY WITH PROPOFOL;  Surgeon: Lollie Sails, MD;  Location: [facility];  Service: Endoscopy;  Laterality: N/A;  . HYSTEROSCOPY W/D&C N/A 07/10/2016   Procedure: DILATATION AND CURETTAGE /HYSTEROSCOPY WITH ENDOMETRIAL BIOPSY;  Surgeon: Honor Loh Ward, MD;  Location: [facility] ORS;  Service: Gynecology;  Laterality: N/A;  . LAPAROSCOPIC APPENDECTOMY N/A 04/01/2016   Procedure: APPENDECTOMY LAPAROSCOPIC;  Surgeon: Jules Husbands, MD;  Location: [facility] ORS;  Service: General;  Laterality: N/A;    Family History   Problem Relation Age of Onset  . Diabetes Mother   . Hypertension Father   . Colon cancer Neg Hx     Social History Social History  Substance Use Topics  . Smoking status: Never Smoker  . Smokeless tobacco: Never Used  . Alcohol use No    No Known Allergies  Current Outpatient Prescriptions  Medication Sig Dispense Refill  . Calcium Carbonate-Vitamin D (CALTRATE 600+D PO) Take 1 tablet by mouth daily.    . misoprostol (CYTOTEC) 200 MCG tablet Take 200 mcg by mouth 4 (four) times daily.    . bisacodyl (DULCOLAX) 5 MG EC tablet Take 4 tablets (20 mg total) by mouth once. 4 tablet 0  . erythromycin base (E-MYCIN) 500 MG tablet Take 2 tablets (1,000 mg total) by mouth 3 (three) times daily. 6 tablet 0  . neomycin (MYCIFRADIN) 500 MG tablet Take 2 tablets (1,000 mg total) by mouth 3 (three) times daily. 6 tablet 0  . polyethylene glycol powder (GLYCOLAX/MIRALAX) powder Take 255 g by mouth once. 255 g 0   No current facility-administered medications for this visit.      Review of Systems A 10 point review of systems was asked and was negative except for the information on the HPI  Physical Exam Blood pressure (!) 165/77, pulse 86, temperature 98.1 F (36.7 C), temperature source Oral, height 5\' 5"  (1.651 m), weight 63 kg (139 lb). CONSTITUTIONAL: NAD well nourished EYES: Pupils are equal, round, and reactive to light, Sclera are non-icteric. EARS, NOSE, MOUTH AND THROAT: The oropharynx  is clear. The oral mucosa is pink and moist. Hearing is intact to voice. LYMPH NODES:  Lymph nodes in the neck are normal. RESPIRATORY:  Lungs are clear. There is normal respiratory effort, with equal breath sounds bilaterally, and without pathologic use of accessory muscles. CARDIOVASCULAR: Heart is regular without murmurs, gallops, or rubs. GI: The abdomen is  soft, nontender, and nondistended. There are no palpable masses. There is no hepatosplenomegaly. There are normal bowel sounds in all  quadrants. GU: Rectal deferred.   MUSCULOSKELETAL: Normal muscle strength and tone. No cyanosis or edema.   SKIN: Turgor is good and there are no pathologic skin lesions or ulcers. NEUROLOGIC: Motor and sensation is grossly normal. Cranial nerves are grossly intact. PSYCH:  Oriented to person, place and time. Affect is normal.  Data Reviewed  I have personally reviewed the patient's imaging, laboratory findings and medical records.    Assessment/Plan [name] diagnosis adenocarcinoma of the cecum as is here with multiple serrated adenomas all on the right side. She is in need for a right hemicolectomy. In addition to this she does need a hysterectomy and we will go ahead and schedule her in the same setting. We'll have to coordinate this with Dr. Leonides Schanz and to perform those 2 major surgeries in the same operative setting. The meantime I will go ahead and order a CEA as well as a CT scan of the abdomen and pelvis for staging purposes. I have discussed with the patient in detail about the operation, risk, benefits and possible complications including but not limited to: Bleeding, anastomotic leak, potential need for colostomy, possible open, possible injury to the ureters as well as prolonged hospitalization and pain. She understands and wishes to proceed. I will await for further diagnostic workup and we'll tentatively put her on the schedule next few weeks and follow with Dr. Leonides Schanz. Extensive counseling provided. I Spent more than 45 minutes in this encounter with the majority of time spent in counseling and coordination of care.   Caroleen Hamman, MD FACS General Surgeon 07/24/2016, 3:26 PM

## 2016-08-20 NOTE — Interval H&P Note (Signed)
History and Physical Interval Note:  08/20/2016 9:03 AM  Katherine Medina  has presented today for surgery, with the diagnosis of colon ca  The various methods of treatment have been discussed with the patient and family. After consideration of risks, benefits and other options for treatment, the patient has consented to  Procedure(s): HAND ASSISTED LAPAROSCOPIC COLON RESECTION/ HEMI COLECTOMY (N/A) PARTIAL COLECTOMY (N/A) HYSTERECTOMY TOTAL LAPAROSCOPIC WITH BSO (N/A) as a surgical intervention .  The patient's history has been reviewed, patient examined, no change in status, stable for surgery.  I have reviewed the patient's chart and labs.  Questions were answered to the patient's satisfaction.     [place]

## 2016-08-21 ENCOUNTER — Encounter: Payer: Self-pay | Admitting: Surgery

## 2016-08-21 LAB — CBC
HCT: 38.2 % (ref 35.0–47.0)
Hemoglobin: 12.7 g/dL (ref 12.0–16.0)
MCH: 27.8 pg (ref 26.0–34.0)
MCHC: 33.3 g/dL (ref 32.0–36.0)
MCV: 83.4 fL (ref 80.0–100.0)
PLATELETS: 158 10*3/uL (ref 150–440)
RBC: 4.58 MIL/uL (ref 3.80–5.20)
RDW: 14.6 % — AB (ref 11.5–14.5)
WBC: 15.3 10*3/uL — AB (ref 3.6–11.0)

## 2016-08-21 LAB — BASIC METABOLIC PANEL
Anion gap: 6 (ref 5–15)
BUN: 15 mg/dL (ref 6–20)
CO2: 25 mmol/L (ref 22–32)
CREATININE: 1.26 mg/dL — AB (ref 0.44–1.00)
Calcium: 8.3 mg/dL — ABNORMAL LOW (ref 8.9–10.3)
Chloride: 103 mmol/L (ref 101–111)
GFR calc Af Amer: 44 mL/min — ABNORMAL LOW (ref >60)
GFR, EST NON AFRICAN AMERICAN: 38 mL/min — AB (ref >60)
Glucose, Bld: 164 mg/dL — ABNORMAL HIGH (ref 65–99)
Potassium: 3.6 mmol/L (ref 3.5–5.1)
SODIUM: 134 mmol/L — AB (ref 135–145)

## 2016-08-21 MED ORDER — OXYCODONE HCL 5 MG PO TABS: 5.0000 mg | ORAL_TABLET | ORAL | Status: DC | PRN

## 2016-08-21 NOTE — Progress Notes (Signed)
POD # 1 Doing well Creat slight bump AVSS Decrease uo Tolerating clears  PE NAD Abd: incision c/d/i, no infection or peritontiis  A/P Doing well Inc IVF Mobilize Keep on clears

## 2016-08-21 NOTE — Care Management (Signed)
Patient admitted post hemicolectomy and hysterectomy.  Patient lives at home with husband.  Adult daughter lives locally for support.  Patient currently requiring acute O2 at 1.5 liters.  Patient has ambulated in the room with bedside RN.  I have requested a PT consult.  PCP Gilford Rile, [facility] following.

## 2016-08-21 NOTE — Progress Notes (Signed)
Pt ambulated around the nursing station, pt tolerated ambulation well. Will continue to encourage ambulation.   Dracen Reigle [employer]

## 2016-08-21 NOTE — Progress Notes (Signed)
Pt ambulated to chair, tolerated ambulation well. Pt is sitting in chair at this time, phone and call light within in reach. Will continue to monitor pt.  Arlie Riker [employer]

## 2016-08-21 NOTE — Progress Notes (Signed)
Removed foley per order, pt due to void by 1500 08/21/16

## 2016-08-21 NOTE — Progress Notes (Signed)
Patient's foley was taken out this morning at 9:00am.  Patient complaining of lots of discomfort in the lower abdomen.  She has tried to void a couple times but unsuccessful.  I called Dr. Dahlia Byes to give him this information.  He acknowledged and gave a one time order for an in and out cath.

## 2016-08-21 NOTE — Progress Notes (Signed)
This was a follow-up visit. [place] made a follow-up visit with Pt that the on-call [place] had seen earlier. [place] visited and talked with Pt and her family that were in the Rm at the time of this visit. Pt talked about the progress she was making, was thankful for the care, requested for prayers, which the Ch, provided.    08/21/16 1600  Clinical Encounter Type  Visited With Patient;Patient and family together  Visit Type Initial;Follow-up;Spiritual support;Other (Comment)  Referral From Chaplain  Consult/Referral To [place];Other (Comment)

## 2016-08-21 NOTE — Care Management Important Message (Signed)
Important Message  Patient Details  Name: Katherine Medina MRN: [id number] Date of Birth: 1928/12/26   Medicare Important Message Given:  Yes    Beverly Sessions, RN 08/21/2016, 2:46 PM

## 2016-08-22 ENCOUNTER — Inpatient Hospital Stay: Payer: Medicare Other

## 2016-08-22 LAB — BASIC METABOLIC PANEL
Anion gap: 6 (ref 5–15)
BUN: 24 mg/dL — AB (ref 6–20)
CALCIUM: 8.4 mg/dL — AB (ref 8.9–10.3)
CO2: 28 mmol/L (ref 22–32)
CREATININE: 1.23 mg/dL — AB (ref 0.44–1.00)
Chloride: 98 mmol/L — ABNORMAL LOW (ref 101–111)
GFR calc Af Amer: 45 mL/min — ABNORMAL LOW (ref >60)
GFR, EST NON AFRICAN AMERICAN: 39 mL/min — AB (ref >60)
Glucose, Bld: 123 mg/dL — ABNORMAL HIGH (ref 65–99)
Potassium: 3.4 mmol/L — ABNORMAL LOW (ref 3.5–5.1)
SODIUM: 132 mmol/L — AB (ref 135–145)

## 2016-08-22 LAB — CBC
HCT: 40.3 % (ref 35.0–47.0)
Hemoglobin: 13.7 g/dL (ref 12.0–16.0)
MCH: 28.4 pg (ref 26.0–34.0)
MCHC: 34 g/dL (ref 32.0–36.0)
MCV: 83.4 fL (ref 80.0–100.0)
PLATELETS: 155 10*3/uL (ref 150–440)
RBC: 4.83 MIL/uL (ref 3.80–5.20)
RDW: 14.3 % (ref 11.5–14.5)
WBC: 17 10*3/uL — ABNORMAL HIGH (ref 3.6–11.0)

## 2016-08-22 MED ORDER — LACTATED RINGERS IV BOLUS (SEPSIS)
500.0000 mL | Freq: Once | INTRAVENOUS | Status: AC
  Administered 2016-08-22: 500 mL via INTRAVENOUS

## 2016-08-22 MED ORDER — ACETAMINOPHEN 10 MG/ML IV SOLN
1000.0000 mg | Freq: Four times a day (QID) | INTRAVENOUS | Status: AC
  Administered 2016-08-22 – 2016-08-23 (×4): 1000 mg via INTRAVENOUS
  Filled 2016-08-22 (×4): qty 100

## 2016-08-22 MED ORDER — PANTOPRAZOLE SODIUM 40 MG IV SOLR
40.0000 mg | Freq: Two times a day (BID) | INTRAVENOUS | Status: DC
  Administered 2016-08-22 – 2016-08-28 (×12): 40 mg via INTRAVENOUS
  Filled 2016-08-22 (×13): qty 40

## 2016-08-22 MED ORDER — ENOXAPARIN SODIUM 30 MG/0.3ML ~~LOC~~ SOLN
30.0000 mg | SUBCUTANEOUS | Status: DC
  Administered 2016-08-23 – 2016-08-24 (×2): 30 mg via SUBCUTANEOUS
  Filled 2016-08-22 (×2): qty 0.3

## 2016-08-22 MED ORDER — ONDANSETRON HCL 4 MG/2ML IJ SOLN
4.0000 mg | Freq: Once | INTRAMUSCULAR | Status: AC
  Administered 2016-08-22: 4 mg via INTRAVENOUS

## 2016-08-22 MED ORDER — GABAPENTIN 100 MG PO CAPS: 200.0000 mg | ORAL_CAPSULE | Freq: Three times a day (TID) | ORAL | Status: DC

## 2016-08-22 MED ORDER — PROMETHAZINE HCL 25 MG/ML IJ SOLN: 12.5000 mg | Freq: Four times a day (QID) | INTRAMUSCULAR | Status: DC | PRN

## 2016-08-22 MED ORDER — PROMETHAZINE HCL 25 MG/ML IJ SOLN
12.5000 mg | INTRAMUSCULAR | Status: DC | PRN
  Administered 2016-08-22: 12.5 mg via INTRAVENOUS
  Filled 2016-08-22: qty 1

## 2016-08-22 MED ORDER — ALBUMIN HUMAN 25 % IV SOLN
12.5000 g | Freq: Once | INTRAVENOUS | Status: AC
  Administered 2016-08-22: 12.5 g via INTRAVENOUS
  Filled 2016-08-22: qty 50

## 2016-08-22 NOTE — Anesthesia Postprocedure Evaluation (Signed)
Anesthesia Post Note  Patient: Katherine Medina  Procedure(s) Performed: Procedure(s) (LRB): HAND ASSISTED LAPAROSCOPIC COLON RESECTION/ HEMI COLECTOMY (N/A) PARTIAL COLECTOMY (N/A) HYSTERECTOMY TOTAL LAPAROSCOPIC WITH BSO (N/A)  Patient location during evaluation: PACU Anesthesia Type: General Level of consciousness: awake and alert Pain management: pain level controlled Vital Signs Assessment: post-procedure vital signs reviewed and stable Respiratory status: spontaneous breathing, nonlabored ventilation, respiratory function stable and patient connected to nasal cannula oxygen Cardiovascular status: blood pressure returned to baseline and stable Postop Assessment: no signs of nausea or vomiting Anesthetic complications: no    Last Vitals:  Vitals:   08/22/16 0233 08/22/16 0515  BP: (!) 128/55 (!) 151/55  Pulse: 88 97  Resp:  (!) 24  Temp:  36.6 C    Last Pain:  Vitals:   08/22/16 0515  TempSrc: Oral  PainSc:                  Molli Barrows

## 2016-08-22 NOTE — Progress Notes (Signed)
POD # 2 Urinary retention requiring foley Taking clear and is hungry She feels sore AVSS Increase WBC and creat likely from retention + flatus  PE NAD Abd: soft, incisions c/d/i, no infection or peritonitis  A/P slow progression  Advance to fulls only Keep foley Increase gabapentin dose and decrease ivf F/u labs

## 2016-08-22 NOTE — Progress Notes (Signed)
Pt with worsening distension and vomiting. KUB reviewed dilated loops of SB w some free air ( likely post op less 48hrs surgery) She did get some phenergan and now is resting comfortable She has made good urine and her NGT was placed and more than 800cc came out  PE resting Abd: soft but distended, incisions c/d/i, no peritonitis  A/P We will increase her ivf, keep her ngt, npo F/U serial exam and labs. I think she obviously has an ileus but leak should be in the differential. We will continue to follow, may need a CT if sxs do no improve D/W daughter in detail she is at her bedside.

## 2016-08-22 NOTE — Progress Notes (Signed)
Dr Adonis Huguenin notified of pt inability to void. BS results. Received orders to insert foley. Will continue to monitor.

## 2016-08-22 NOTE — Progress Notes (Signed)
Order for enoxaparin 40 mg subcutaneously daily was adjusted to enoxaparin 30 mg subcutaneously daily per anticoagulation protocol for CrCl < 30 mL/min.  Lenis Noon, PharmD 08/22/16 10:44 AM

## 2016-08-23 ENCOUNTER — Inpatient Hospital Stay: Payer: Medicare Other

## 2016-08-23 LAB — CBC
HEMATOCRIT: 36.4 % (ref 35.0–47.0)
Hemoglobin: 12.1 g/dL (ref 12.0–16.0)
MCH: 27.9 pg (ref 26.0–34.0)
MCHC: 33.2 g/dL (ref 32.0–36.0)
MCV: 84.1 fL (ref 80.0–100.0)
PLATELETS: 150 10*3/uL (ref 150–440)
RBC: 4.33 MIL/uL (ref 3.80–5.20)
RDW: 14.4 % (ref 11.5–14.5)
WBC: 11.6 10*3/uL — ABNORMAL HIGH (ref 3.6–11.0)

## 2016-08-23 LAB — BASIC METABOLIC PANEL
Anion gap: 8 (ref 5–15)
BUN: 29 mg/dL — AB (ref 6–20)
CALCIUM: 8.3 mg/dL — AB (ref 8.9–10.3)
CO2: 28 mmol/L (ref 22–32)
CREATININE: 1.37 mg/dL — AB (ref 0.44–1.00)
Chloride: 97 mmol/L — ABNORMAL LOW (ref 101–111)
GFR calc Af Amer: 40 mL/min — ABNORMAL LOW (ref >60)
GFR, EST NON AFRICAN AMERICAN: 34 mL/min — AB (ref >60)
GLUCOSE: 94 mg/dL (ref 65–99)
Potassium: 3.5 mmol/L (ref 3.5–5.1)
SODIUM: 133 mmol/L — AB (ref 135–145)

## 2016-08-23 MED ORDER — PHENOL 1.4 % MT LIQD
1.0000 | OROMUCOSAL | Status: DC | PRN
  Filled 2016-08-23: qty 177

## 2016-08-23 NOTE — Progress Notes (Signed)
Seen and examined , HR 90s resting but did wake up and was feeling better. This was confirmed w daughter and RN Overall improving from a few hrs ago  PE NAD Abd: distended, soft, no peritonitis or infection  A/P continue current care Fluids, foley, NGT Xray and serial exams

## 2016-08-23 NOTE — Progress Notes (Signed)
3 Days Post-Op   Subjective:  82 year old female status post laparoscopic removal of colon cancer. Developed an ileus yesterday. Tolerated NG tube placement with good relief of abdominal symptoms. She denies any flatus or BM currently.  Vital signs in last 24 hours: Temp:  [97.8 F (36.6 C)-98.3 F (36.8 C)] 98.1 F (36.7 C) (12/10 0517) Pulse Rate:  [78-108] 80 (12/10 0517) Resp:  [16-20] 18 (12/10 0517) BP: (95-134)/(34-61) 112/43 (12/10 0517) SpO2:  [92 %-96 %] 94 % (12/10 0517) Last BM Date: 08/19/16  Intake/Output from previous day: 12/09 0701 - 12/10 0700 In: 3166.7 [P.O.:240; I.V.:2606.7; NG/GT:30; IV Piggyback:200] Out: 1950 [Urine:550; Emesis/NG output:1400]  GI: Abdomen soft, nondistended, purple tender to palpation at her incision sites. Staples in place without any evidence of erythema or drainage.  Lab Results:  CBC  Recent Labs  08/22/16 0558 08/23/16 0729  WBC 17.0* 11.6*  HGB 13.7 12.1  HCT 40.3 36.4  PLT 155 150   CMP     Component Value Date/Time   NA 133 (L) 08/23/2016 0729   K 3.5 08/23/2016 0729   CL 97 (L) 08/23/2016 0729   CO2 28 08/23/2016 0729   GLUCOSE 94 08/23/2016 0729   BUN 29 (H) 08/23/2016 0729   CREATININE 1.37 (H) 08/23/2016 0729   CALCIUM 8.3 (L) 08/23/2016 0729   PROT 8.1 07/24/2016 1201   ALBUMIN 4.2 07/24/2016 1201   AST 26 07/24/2016 1201   ALT 14 07/24/2016 1201   ALKPHOS 63 07/24/2016 1201   BILITOT 0.4 07/24/2016 1201   GFRNONAA 34 (L) 08/23/2016 0729   GFRAA 40 (L) 08/23/2016 0729   PT/INR No results for input(s): LABPROT, INR in the last 72 hours.  Studies/Results: Dg Abd Acute W/chest  Result Date: 08/23/2016 CLINICAL DATA:  Partial colectomy August 20, 2016. Gaseous distension. Follow-up. EXAM: DG ABDOMEN ACUTE W/ 1V CHEST COMPARISON:  August 22, 2016 FINDINGS: Free air is consistent with recent surgery. The NG tube terminates in the stomach, pulled back somewhat in the interval. Both the distal tip and  sideport are in the stomach. Bibasilar opacities are likely atelectasis, similar in the interval. Stable cardiomegaly and hiatal hernia. No other changes in the chest. Air-filled loops of large and small bowel are similar in the interval, likely ileus. Air in the subcutaneous tissues may be from recent surgery. There is gas to the level the rectum on this study. IMPRESSION: 1. Appropriate placement of NG tube. Postoperative free air. Continued ileus. 2. Effusions and underlying atelectasis in the lung bases. Electronically Signed   By: Dorise Bullion III M.D   On: 08/23/2016 07:48   Dg Abd Acute W/chest  Result Date: 08/22/2016 CLINICAL DATA:  Pt had a laparoscopic hemicolectomy from colon cancer on 08-20-16. Pt is feeling nauseous and is vomiting now. EXAM: DG ABDOMEN ACUTE W/ 1V CHEST COMPARISON:  07/31/2016, 07/24/2016 FINDINGS: Shallow lung inflation. Heart size is normal. There is bibasilar atelectasis. More focal opacity at the left lung base may indicate infectious infiltrate. Left pleural effusion is noted. There is gaseous distension of numerous bowel loops throughout the abdomen and pelvis. Moderate free intraperitoneal air. Moderate subcutaneous gas along the abdominal wall. Moderate degenerative changes in the lumbar spine, SI joints. IMPRESSION: 1. Moderate free intraperitoneal air and subcutaneous gas following recent surgery. 2. Significant gas is distension of bowel loops throughout the abdomen and pelvis, favoring ileus. 3. Left lower lobe infiltrate and left pleural effusion. Electronically Signed   By: Nolon Nations M.D.   On: 08/22/2016  17:16   Dg Abd Portable 1v  Result Date: 08/22/2016 CLINICAL DATA:  NG tube placement. EXAM: PORTABLE ABDOMEN - 1 VIEW COMPARISON:  Radiographs earlier this day FINDINGS: Tip and side port of the enteric tube below the diaphragm likely in the stomach. There is a large hiatal hernia. Free intra-abdominal air consistent with recent surgery, unchanged.  Gaseous distention of bowel loops in the upper abdomen again seen. Subcutaneous gas in the right abdominal wall. There is atherosclerosis of the thoracic aorta. There is blunting of the costophrenic angle with small effusion and linear opacities. IMPRESSION: 1. Tip and side port of the enteric tube below the diaphragm, likely in the stomach. There is a large hiatal hernia. 2. Unchanged gaseous distention of bowel loops in the abdomen, suspect ileus. Free air post recent intra-abdominal surgery. 3. Small left pleural effusion and basilar opacity, unchanged. Electronically Signed   By: Jeb Levering M.D.   On: 08/22/2016 18:30    Assessment/Plan: 82 year old female status post laparoscopic-assisted removal of right colon. Now with postoperative ileus. Tolerated NG tube. Encourage ambulation and await for return of bowel function currently.   Clayburn Pert, MD FACS General Surgeon  08/23/2016

## 2016-08-24 ENCOUNTER — Other Ambulatory Visit: Payer: Self-pay | Admitting: Pathology

## 2016-08-24 ENCOUNTER — Inpatient Hospital Stay: Payer: Medicare Other

## 2016-08-24 LAB — BASIC METABOLIC PANEL
ANION GAP: 3 — AB (ref 5–15)
BUN: 19 mg/dL (ref 6–20)
CALCIUM: 7.9 mg/dL — AB (ref 8.9–10.3)
CO2: 31 mmol/L (ref 22–32)
Chloride: 105 mmol/L (ref 101–111)
Creatinine, Ser: 1.09 mg/dL — ABNORMAL HIGH (ref 0.44–1.00)
GFR, EST AFRICAN AMERICAN: 52 mL/min — AB (ref >60)
GFR, EST NON AFRICAN AMERICAN: 45 mL/min — AB (ref >60)
Glucose, Bld: 111 mg/dL — ABNORMAL HIGH (ref 65–99)
Potassium: 3.6 mmol/L (ref 3.5–5.1)
Sodium: 139 mmol/L (ref 135–145)

## 2016-08-24 LAB — CBC
HEMATOCRIT: 33.1 % — AB (ref 35.0–47.0)
HEMOGLOBIN: 11 g/dL — AB (ref 12.0–16.0)
MCH: 27.8 pg (ref 26.0–34.0)
MCHC: 33.2 g/dL (ref 32.0–36.0)
MCV: 83.7 fL (ref 80.0–100.0)
Platelets: 171 10*3/uL (ref 150–440)
RBC: 3.96 MIL/uL (ref 3.80–5.20)
RDW: 14.4 % (ref 11.5–14.5)
WBC: 11.5 10*3/uL — AB (ref 3.6–11.0)

## 2016-08-24 MED ORDER — KETOROLAC TROMETHAMINE 15 MG/ML IJ SOLN
15.0000 mg | Freq: Four times a day (QID) | INTRAMUSCULAR | Status: DC | PRN
  Administered 2016-08-25: 15 mg via INTRAVENOUS
  Filled 2016-08-24: qty 1

## 2016-08-24 MED ORDER — ACETAMINOPHEN 10 MG/ML IV SOLN
1000.0000 mg | Freq: Four times a day (QID) | INTRAVENOUS | Status: AC
  Administered 2016-08-24 – 2016-08-25 (×4): 1000 mg via INTRAVENOUS
  Filled 2016-08-24 (×4): qty 100

## 2016-08-24 MED ORDER — ENOXAPARIN SODIUM 40 MG/0.4ML ~~LOC~~ SOLN
40.0000 mg | SUBCUTANEOUS | Status: DC
  Administered 2016-08-25 – 2016-08-28 (×4): 40 mg via SUBCUTANEOUS
  Filled 2016-08-24 (×4): qty 0.4

## 2016-08-24 NOTE — Progress Notes (Signed)
Foley emptied and clamped per MD order.  Will continue to assess and monitor patient.

## 2016-08-24 NOTE — Progress Notes (Signed)
82 yr old female with Colon CA and Lynch Syndrome POD#4 Laparoscopic Right hemicolectomy with Dr. Dahlia Byes and Total Hysterectomy and BSO with Dr. Leonides Schanz. Patient has been doing well today but has had an ileus.  She has the NG tube in place.  She states she has passed gas twice and feels less bloated today.    Vitals:   08/24/16 0500 08/24/16 0752  BP: (!) 148/57 (!) 136/49  Pulse: (!) 109 (!) 103  Resp: 18 18  Temp: 98.8 F (37.1 C) 98.2 F (36.8 C)   I/O last 3 completed shifts: In: 39 [I.V.:4218; Other:90; NG/GT:150; IV Piggyback:200] Out: 3300 [Urine:1750; Emesis/NG output:1550] Total I/O In: [id number] [I.V.:927] Out: 225 [Urine:225]   PE:  Gen: NAD Abd: soft, non-distended, appropriately tender, incisions c/d/i no erythema or drainage, slight bruising in inferior portion.  Ext: 2+ pulses no edema  CBC Latest Ref Rng & Units 08/24/2016 08/23/2016 08/22/2016  WBC 3.6 - 11.0 K/uL 11.5(H) 11.6(H) 17.0(H)  Hemoglobin 12.0 - 16.0 g/dL 11.0(L) 12.1 13.7  Hematocrit 35.0 - 47.0 % 33.1(L) 36.4 40.3  Platelets 150 - 440 K/uL 171 150 155   CMP Latest Ref Rng & Units 08/24/2016 08/23/2016 08/22/2016  Glucose 65 - 99 mg/dL 111(H) 94 123(H)  BUN 6 - 20 mg/dL 19 29(H) 24(H)  Creatinine 0.44 - 1.00 mg/dL 1.09(H) 1.37(H) 1.23(H)  Sodium 135 - 145 mmol/L 139 133(L) 132(L)  Potassium 3.5 - 5.1 mmol/L 3.6 3.5 3.4(L)  Chloride 101 - 111 mmol/L 105 97(L) 98(L)  CO2 22 - 32 mmol/L 31 28 28   Calcium 8.9 - 10.3 mg/dL 7.9(L) 8.3(L) 8.4(L)  Total Protein 6.5 - 8.1 g/dL - - -  Total Bilirubin 0.3 - 1.2 mg/dL - - -  Alkaline Phos 38 - 126 U/L - - -  AST 15 - 41 U/L - - -  ALT 14 - 54 U/L - - -   A/P:  82 yr old female with Colon CA and Lynch Syndrome POD#4 Laparoscopic Right hemicolectomy with Dr. Dahlia Byes and Total Hysterectomy and BSO with Dr. Leonides Schanz.  Pain: IV tylenol scheduled, prn Toradol IV GI: NG tube clamped today as output decreased and some flatus, likely will be able to d/c tomorrow and advance  diet GU: urinary retention, foley put back in on Saturday, will do bladder training today, then D/c foley Encourage ambulation: PT consult to evaluate for deconditioning, ? [place] at home

## 2016-08-24 NOTE — Progress Notes (Signed)
Pt alert. Husband at home. CH is available.   08/24/16 1130  Clinical Encounter Type  Visited With Patient  Visit Type Initial  Referral From Nurse  Spiritual Encounters  Spiritual Needs Emotional  Stress Factors  Patient Stress Factors None identified

## 2016-08-24 NOTE — Progress Notes (Signed)
CH was rounding the unit to ask Nurses to come and attend the code [place] when [facility] met the husband of the Pt. [place] talked with husband, Pt's husband asked [place] to visit the Pt; Pt told [place] she was doing much better, CH spoke with Pt, her daughter and husband briefly, and the left. Pt appeared to be in good spirit at the time of this visit.     08/24/16 1600  Clinical Encounter Type  Visited With Patient;Patient and family together  Visit Type Follow-up;Spiritual support  Referral From Family  Consult/Referral To Chaplain  Spiritual Encounters  Spiritual Needs Prayer;Other (Comment)

## 2016-08-24 NOTE — Progress Notes (Signed)
NG came out by accident. md notified. Per dr. Pat Patrick okay to leave ng out for now. Continue to monitor for n/v abdominal pain and distention

## 2016-08-24 NOTE — Progress Notes (Signed)
Order for enoxaparin 30 mg daily changed to 40 mg per anticoagulation protocol for CrCl > 30 mL/min.  Lenis Noon, PharmD 08/24/16 3:24 PM

## 2016-08-25 LAB — SURGICAL PATHOLOGY

## 2016-08-25 MED ORDER — KETOROLAC TROMETHAMINE 15 MG/ML IJ SOLN
15.0000 mg | Freq: Four times a day (QID) | INTRAMUSCULAR | Status: DC | PRN
  Administered 2016-08-26: 15 mg via INTRAVENOUS
  Filled 2016-08-25: qty 1

## 2016-08-25 MED ORDER — MENTHOL 3 MG MT LOZG
1.0000 | LOZENGE | OROMUCOSAL | Status: DC | PRN
  Administered 2016-08-25: 3 mg via ORAL
  Filled 2016-08-25: qty 9

## 2016-08-25 MED ORDER — TAMSULOSIN HCL 0.4 MG PO CAPS
0.4000 mg | ORAL_CAPSULE | Freq: Every day | ORAL | Status: DC
  Administered 2016-08-25: 0.4 mg via ORAL
  Filled 2016-08-25: qty 1

## 2016-08-25 MED ORDER — ACETAMINOPHEN 325 MG PO TABS
650.0000 mg | ORAL_TABLET | Freq: Four times a day (QID) | ORAL | Status: DC
  Administered 2016-08-25 – 2016-08-28 (×12): 650 mg via ORAL
  Filled 2016-08-25 (×12): qty 2

## 2016-08-25 MED ORDER — OXYCODONE HCL 5 MG PO TABS: 5.0000 mg | ORAL_TABLET | ORAL | Status: DC | PRN

## 2016-08-25 NOTE — Progress Notes (Signed)
Md notified that pt is unable to void. Received orders to re-insert foley.

## 2016-08-25 NOTE — Care Management Important Message (Signed)
Important Message  Patient Details  Name: Katherine Medina MRN: [id number] Date of Birth: 1929/04/05   Medicare Important Message Given:  Yes    Beverly Sessions, RN 08/25/2016, 3:24 PM

## 2016-08-25 NOTE — Plan of Care (Signed)
Problem: Fluid Volume: Goal: Ability to maintain a balanced intake and output will improve Outcome: Not Progressing Pt still unable to void

## 2016-08-25 NOTE — Progress Notes (Signed)
Pt C/O sore throat and hoarseness. MD ordered cepacol and clear liquid diet.

## 2016-08-25 NOTE — Care Management (Signed)
Patient states post Right hemicolectomy and Total Hysterectomy.  Patient lives at home with husband.  Adult daughter lives locally for support.  PCP Lisette Grinder.  Pharmacy [facility].  Patient has RW in the home.  Baseline independent.  PT has evaluated patient and recommended home health PT.  Patient is agreeable to home health PT, will provide home health agency list.  RNCM Following

## 2016-08-25 NOTE — Progress Notes (Signed)
82 yr old female with Colon CA and Lynch Syndrome POD#5 Laparoscopic Right hemicolectomy with Dr. Dahlia Byes and Total Hysterectomy and BSO with Dr. Leonides Schanz. Patient has been doing well today but has had an ileus and urinary retention.  She accidentally pulled clamped NG tube around 2am and had to have foley replaced for retention.She has been passing some gas and had a BM.  She states pain well controlled.  She was up walking with PT and then sitting in chair.   Vitals:   08/25/16 0610 08/25/16 1247  BP: (!) 140/55 (!) 131/47  Pulse: 89 (!) 106  Resp: 18 18  Temp: 98.2 F (36.8 C) 98.5 F (36.9 C)   I/O last 3 completed shifts: In: 4051.5 [I.V.:3911.5; NG/GT:40; IV Piggyback:100] Out: [id number] [Urine:1800; Emesis/NG output:50] Total I/O In: 1300 [I.V.:1300] Out: 300 [Urine:300]   PE:  Gen: NAD Abd: soft, non-distended, appropriately tender, incisions c/d/i no erythema or drainage, slight bruising in inferior portion.  Ext: 2+ pulses no edema  CBC Latest Ref Rng & Units 08/24/2016 08/23/2016 08/22/2016  WBC 3.6 - 11.0 K/uL 11.5(H) 11.6(H) 17.0(H)  Hemoglobin 12.0 - 16.0 g/dL 11.0(L) 12.1 13.7  Hematocrit 35.0 - 47.0 % 33.1(L) 36.4 40.3  Platelets 150 - 440 K/uL 171 150 155   CMP Latest Ref Rng & Units 08/24/2016 08/23/2016 08/22/2016  Glucose 65 - 99 mg/dL 111(H) 94 123(H)  BUN 6 - 20 mg/dL 19 29(H) 24(H)  Creatinine 0.44 - 1.00 mg/dL 1.09(H) 1.37(H) 1.23(H)  Sodium 135 - 145 mmol/L 139 133(L) 132(L)  Potassium 3.5 - 5.1 mmol/L 3.6 3.5 3.4(L)  Chloride 101 - 111 mmol/L 105 97(L) 98(L)  CO2 22 - 32 mmol/L 31 28 28   Calcium 8.9 - 10.3 mg/dL 7.9(L) 8.3(L) 8.4(L)  Total Protein 6.5 - 8.1 g/dL - - -  Total Bilirubin 0.3 - 1.2 mg/dL - - -  Alkaline Phos 38 - 126 U/L - - -  AST 15 - 41 U/L - - -  ALT 14 - 54 U/L - - -   A/P:  82 yr old female with Colon CA and Lynch Syndrome POD#5 Laparoscopic Right hemicolectomy with Dr. Dahlia Byes and Total Hysterectomy and BSO with Dr. Leonides Schanz.  Pain:Po ylenol  scheduled,prn Toradol IV GI: NG tube d/c'd, clear liquids today GU: urinary retention, foley replaced for second time yesterday, will start on Flomax today Encourage ambulation: PT consult recommending home health PT and RW at home

## 2016-08-25 NOTE — Evaluation (Signed)
Physical Therapy Evaluation Patient Details Name: Katherine Medina MRN: [id number] DOB: [birth year] Today's Date: 08/25/2016   History of Present Illness  admitted for acute hospitalization status post laprascopic hysterectomy with BSO, hemicolectomy (12/7); post-op course complicated by ileus.    Clinical Impression  Upon evaluation, patient alert and oriented; follows all commands.  Demonstrates good effort/participation with all therex.  Bilat UE/LE strength and ROM generally weak (4/5), but functional for basic transfers and mobiltiy; pain rated at 3/10 (controlled with meds).  Able to complete sit/stand, basic transfers and gait (220') with RW, cga/min assist; generally slow and guarded (10' walk time, 12-13 seconds), but without buckling or LOB.  Does require intermittent rest periods with activity to manage fatigue. Would benefit from skilled PT to address above deficits and promote optimal return to PLOF; Recommend transition to [facility] upon discharge from acute hospitalization.     Follow Up Recommendations Home health PT    Equipment Recommendations   (has access to RW)    Recommendations for Other Services       Precautions / Restrictions Precautions Precautions: Fall Restrictions Weight Bearing Restrictions: No      Mobility  Bed Mobility               General bed mobility comments: seated in recliner beginning/end of session  Transfers Overall transfer level: Needs assistance Equipment used: Rolling walker (2 wheeled) Transfers: Sit to/from Stand Sit to Stand: Min guard;Min assist         General transfer comment: cuing for hand placement to maximize safety  Ambulation/Gait Ambulation/Gait assistance: Min guard Ambulation Distance (Feet): 220 Feet Assistive device: Rolling walker (2 wheeled)   Gait velocity: 10' walk time, 12-13 seconds Gait velocity interpretation: <1.8 ft/sec, indicative of risk for recurrent falls General Gait Details: forward  flexed posture (corrects temporarily with cuing), reciprocal stepping pattern with slow, but fairly steady, cadence and overall gait performance. Mod reliance on RW; recommend continued use with all mobility at this time.  Stairs            Wheelchair Mobility    Modified Rankin (Stroke Patients Only)       Balance Overall balance assessment: Needs assistance Sitting-balance support: No upper extremity supported;Feet supported Sitting balance-Leahy Scale: Good     Standing balance support: Bilateral upper extremity supported Standing balance-Leahy Scale: Fair                               Pertinent Vitals/Pain Pain Assessment: 0-10 Pain Score: 3  Pain Location: abdomen Pain Descriptors / Indicators: Grimacing;Guarding;Aching Pain Intervention(s): Limited activity within patient's tolerance;Monitored during session;Premedicated before session;Repositioned    Home Living Family/patient expects to be discharged to:: Private residence Living Arrangements: Spouse/significant other Available Help at Discharge: Family;Available 24 hours/day Type of Home: House Home Access: Stairs to enter Entrance Stairs-Rails: None Entrance Stairs-Number of Steps: 5 Home Layout: One level Home Equipment: Tub bench      Prior Function Level of Independence: Independent         Comments: Indep with ADLs, household and community mobility; endorses single fall within previous six months     Hand Dominance        Extremity/Trunk Assessment   Upper Extremity Assessment: Overall WFL for tasks assessed           Lower Extremity Assessment: Overall WFL for tasks assessed (grossly at least 4/5 throughout)         Communication  Communication: No difficulties  Cognition Arousal/Alertness: Awake/alert Behavior During Therapy: WFL for tasks assessed/performed Overall Cognitive Status: Within Functional Limits for tasks assessed                       General Comments      Exercises Other Exercises Other Exercises: Reviewed seated LE therex for use as HEP: encouraged ankle pumps, LAQs and marching 2-3x/day; encouraged continued mobility with nursing in addition to therapy (with RW).  Patient/family voiced understanding/agreement.   Assessment/Plan    PT Assessment Patient needs continued PT services  PT Problem List Decreased strength;Decreased activity tolerance;Decreased balance;Decreased mobility;Decreased knowledge of use of DME;Decreased safety awareness;Decreased knowledge of precautions;Cardiopulmonary status limiting activity;Pain;Decreased skin integrity          PT Treatment Interventions DME instruction;Gait training;Stair training;Functional mobility training;Therapeutic activities;Therapeutic exercise;Balance training;Patient/family education    PT Goals (Current goals can be found in the Care Plan section)  Acute Rehab PT Goals Patient Stated Goal: to return home and get over this set back PT Goal Formulation: With patient Time For Goal Achievement: 09/08/16 Potential to Achieve Goals: Good    Frequency Min 2X/week   Barriers to discharge Decreased caregiver support      Co-evaluation               End of Session Equipment Utilized During Treatment: Gait belt Activity Tolerance: Patient tolerated treatment well Patient left: in chair;with call bell/phone within reach;with chair alarm set;with family/visitor present Nurse Communication: Mobility status         Time: [id number] PT Time Calculation (min) (ACUTE ONLY): 21 min   Charges:   PT Evaluation $PT Eval Low Complexity: 1 Procedure PT Treatments $Therapeutic Activity: 8-22 mins   PT G Codes:        Demetrius Mahler H. Owens Shark, PT, DPT, NCS 08/25/16, 10:32 AM [phone]

## 2016-08-25 NOTE — Plan of Care (Signed)
Problem: Activity: Goal: Risk for activity intolerance will decrease Outcome: Progressing Pt ambulated around nursing station twice today.   Problem: Nutrition: Goal: Adequate nutrition will be maintained Outcome: Progressing Pt is now tolerating a clear liquid diet.

## 2016-08-26 MED ORDER — FUROSEMIDE 10 MG/ML IJ SOLN
20.0000 mg | Freq: Once | INTRAMUSCULAR | Status: AC
  Administered 2016-08-26: 20 mg via INTRAVENOUS
  Filled 2016-08-26: qty 2

## 2016-08-26 NOTE — Progress Notes (Signed)
POD# 6 Ileus resolving A bit volume overloaded Path d/w pt in detail AVSS, urinary retention Some cough   PE NAD Chest: rales Abd: soft, appropriate inc tenderness, incision c/d/i, no infection   A/P Overall improving advance diet PT Lasix x1 today only heplock ivd Follow bmp May need Gu consult for recurrent retention

## 2016-08-26 NOTE — Progress Notes (Signed)
Physical Therapy Treatment Patient Details Name: Katherine Medina MRN: [id number] DOB: 1929/03/05 Today's Date: 08/26/2016    History of Present Illness admitted for acute hospitalization status post laprascopic hysterectomy with BSO, hemicolectomy (12/7); post-op course complicated by ileus.      PT Comments    Patient seen in the afternoon, she has already ambulated around the hallways x 2 this morning. She requires cuing for RW placement, noted to have high HR (134 bpm) during ambulation with O2 sats dropping to 87-88% on RA. No loss of balance noted, she is progressing well with ambulation and continues to be appropriate for HHPT once medically appropriate for discharge.   Follow Up Recommendations  Home health PT     Equipment Recommendations       Recommendations for Other Services       Precautions / Restrictions Precautions Precautions: Fall Restrictions Weight Bearing Restrictions: No    Mobility  Bed Mobility               General bed mobility comments: seated in recliner beginning/end of session  Transfers Overall transfer level: Needs assistance Equipment used: Rolling walker (2 wheeled) Transfers: Sit to/from Stand Sit to Stand: Min guard         General transfer comment: Patient used UEs to guide descent/ascent, but no assistance from PT.   Ambulation/Gait Ambulation/Gait assistance: Min guard Ambulation Distance (Feet): 325 Feet Assistive device: Rolling walker (2 wheeled)     Gait velocity interpretation: Below normal speed for age/gender General Gait Details: Patient required cuing for keeping her COM closer to the walker (had it anterior to her COM). Her HR elevated to 134, O2 in the 87-90% range on RA, she reported mild shortness of breath.     Stairs            Wheelchair Mobility    Modified Rankin (Stroke Patients Only)       Balance Overall balance assessment: Needs assistance Sitting-balance support: No upper extremity  supported;Feet supported Sitting balance-Leahy Scale: Good     Standing balance support: Bilateral upper extremity supported Standing balance-Leahy Scale: Fair                      Cognition Arousal/Alertness: Awake/alert Behavior During Therapy: WFL for tasks assessed/performed Overall Cognitive Status: Within Functional Limits for tasks assessed                      Exercises      General Comments        Pertinent Vitals/Pain Pain Assessment: Faces Faces Pain Scale: Hurts little more Pain Location: abdomen Pain Descriptors / Indicators: Grimacing;Guarding;Aching Pain Intervention(s): Limited activity within patient's tolerance;Repositioned    Home Living                      Prior Function            PT Goals (current goals can now be found in the care plan section) Acute Rehab PT Goals Patient Stated Goal: to return home and get over this set back PT Goal Formulation: With patient Time For Goal Achievement: 09/08/16 Potential to Achieve Goals: Good Progress towards PT goals: Progressing toward goals    Frequency    Min 2X/week      PT Plan Current plan remains appropriate    Co-evaluation             End of Session Equipment Utilized During Treatment: Gait belt Activity  Tolerance: Patient tolerated treatment well Patient left: in chair;with call bell/phone within reach;with chair alarm set;with family/visitor present     Time: [id number] PT Time Calculation (min) (ACUTE ONLY): 12 min  Charges:  $Gait Training: 8-22 mins                    G Codes:      Kerman Passey, PT, DPT    08/26/2016, 3:30 PM

## 2016-08-27 LAB — BASIC METABOLIC PANEL
ANION GAP: 6 (ref 5–15)
BUN: 20 mg/dL (ref 6–20)
CALCIUM: 7.9 mg/dL — AB (ref 8.9–10.3)
CO2: 28 mmol/L (ref 22–32)
CREATININE: 1.06 mg/dL — AB (ref 0.44–1.00)
Chloride: 106 mmol/L (ref 101–111)
GFR calc Af Amer: 54 mL/min — ABNORMAL LOW (ref >60)
GFR, EST NON AFRICAN AMERICAN: 47 mL/min — AB (ref >60)
GLUCOSE: 85 mg/dL (ref 65–99)
Potassium: 3.4 mmol/L — ABNORMAL LOW (ref 3.5–5.1)
Sodium: 140 mmol/L (ref 135–145)

## 2016-08-27 LAB — CBC
HCT: 31.8 % — ABNORMAL LOW (ref 35.0–47.0)
Hemoglobin: 10.8 g/dL — ABNORMAL LOW (ref 12.0–16.0)
MCH: 28.1 pg (ref 26.0–34.0)
MCHC: 34.1 g/dL (ref 32.0–36.0)
MCV: 82.6 fL (ref 80.0–100.0)
PLATELETS: 250 10*3/uL (ref 150–440)
RBC: 3.85 MIL/uL (ref 3.80–5.20)
RDW: 14.2 % (ref 11.5–14.5)
WBC: 10.1 10*3/uL (ref 3.6–11.0)

## 2016-08-27 MED ORDER — FUROSEMIDE 20 MG PO TABS: 20.0000 mg | ORAL_TABLET | Freq: Every day | ORAL | Status: DC

## 2016-08-27 MED ORDER — FUROSEMIDE 40 MG PO TABS
40.0000 mg | ORAL_TABLET | Freq: Every day | ORAL | Status: DC
  Administered 2016-08-27 – 2016-08-28 (×2): 40 mg via ORAL
  Filled 2016-08-27 (×2): qty 1

## 2016-08-27 MED ORDER — FUROSEMIDE 10 MG/ML IJ SOLN
20.0000 mg | Freq: Once | INTRAMUSCULAR | Status: AC
  Administered 2016-08-27: 20 mg via INTRAVENOUS
  Filled 2016-08-27: qty 2

## 2016-08-27 MED ORDER — POTASSIUM CHLORIDE 20 MEQ PO PACK
40.0000 meq | PACK | Freq: Two times a day (BID) | ORAL | Status: DC
  Administered 2016-08-27 (×2): 40 meq via ORAL
  Filled 2016-08-27 (×3): qty 2

## 2016-08-27 MED ORDER — ENSURE ENLIVE PO LIQD
237.0000 mL | Freq: Two times a day (BID) | ORAL | Status: DC
  Administered 2016-08-27: 237 mL via ORAL

## 2016-08-27 MED ORDER — BOOST PLUS PO LIQD: 237.0000 mL | Freq: Three times a day (TID) | ORAL | Status: DC

## 2016-08-27 NOTE — Care Management (Signed)
Home health agency preference list provided to patient and family.  [facility] selected.  Anticipated discharge tomorrow.  Family requesting start of care for Saturday.  [facility] selected.  Corene Cornea with [facility] notified of referral.  If [facility] can not accommodate a 24 hour start of care they would like to use [facility].

## 2016-08-27 NOTE — Plan of Care (Signed)
Problem: Activity: Goal: Risk for activity intolerance will decrease Outcome: Progressing Patient ambulated in hallway multiple times today.

## 2016-08-27 NOTE — Progress Notes (Signed)
Doing well VSS Debilitated, des w PT while walking + BM and flatus Overall improving Creat ok AVSS  PE NAD Abd: soft, incisions c/d/i, no infection no peritonitis  A/P Doing well Send pt home tomorrow, keep foley and outpt GU appt D/w GU DC flomax since it is only useful in pt w spinal injury or brain injury Continue pt Lasix today Replace K po Regular diet

## 2016-08-27 NOTE — Progress Notes (Signed)
Physical Therapy Treatment Patient Details Name: Katherine Medina MRN: [id number] DOB: [birth year] Today's Date: 08/27/2016    History of Present Illness admitted for acute hospitalization status post laprascopic hysterectomy with BSO, hemicolectomy (12/7); post-op course complicated by ileus.      PT Comments   Pt continues to progress with PT and is limited by decreased endurance.  Pt attempted to amb 3 laps but needed to stop after 2 due to increased WOB and fatigue.  Pt assisted on/off commode with supervision assist.   Cont with POC.   Follow Up Recommendations  Home health PT     Equipment Recommendations       Recommendations for Other Services       Precautions / Restrictions Precautions Precautions: Fall Restrictions Weight Bearing Restrictions: No    Mobility  Bed Mobility               General bed mobility comments: Pt up in recliner at beginning and end of session  Transfers Overall transfer level: Needs assistance Equipment used: Rolling walker (2 wheeled) Transfers: Sit to/from Stand Sit to Stand: Supervision         General transfer comment: Uses UE's appropriately for ascent and descent, grab bar used during toilet transfer  Ambulation/Gait Ambulation/Gait assistance: Supervision Ambulation Distance (Feet): 325 Feet Assistive device: Rolling walker (2 wheeled)       General Gait Details: Slow steady gait; standing rest break x 3 for 10-15 seconds due to increased WOB   Stairs            Wheelchair Mobility    Modified Rankin (Stroke Patients Only)       Balance                                    Cognition Arousal/Alertness: Awake/alert Behavior During Therapy: WFL for tasks assessed/performed Overall Cognitive Status: Within Functional Limits for tasks assessed                      Exercises Other Exercises Other Exercises: Seated therex: AP, HS, SLR, and LAQ a 10 reps each    General Comments         Pertinent Vitals/Pain Pain Assessment: No/denies pain    Home Living                      Prior Function            PT Goals (current goals can now be found in the care plan section) Acute Rehab PT Goals Patient Stated Goal: to return home and get over this set back PT Goal Formulation: With patient Time For Goal Achievement: 09/08/16 Potential to Achieve Goals: Good Progress towards PT goals: Progressing toward goals    Frequency    Min 2X/week      PT Plan Current plan remains appropriate    Co-evaluation             End of Session   Activity Tolerance: Patient tolerated treatment well Patient left: in chair;with call bell/phone within reach;with chair alarm set;with family/visitor present     Time: [id number] PT Time Calculation (min) (ACUTE ONLY): 25 min  Charges:  $Gait Training: 8-22 mins $Therapeutic Exercise: 8-22 mins                    G Codes:      Ansley Mangiapane A Leilana Mcquire,  PT 08/27/2016, 11:08 AM

## 2016-08-28 LAB — BASIC METABOLIC PANEL
Anion gap: 4 — ABNORMAL LOW (ref 5–15)
BUN: 19 mg/dL (ref 6–20)
CHLORIDE: 106 mmol/L (ref 101–111)
CO2: 31 mmol/L (ref 22–32)
Calcium: 7.8 mg/dL — ABNORMAL LOW (ref 8.9–10.3)
Creatinine, Ser: 1.24 mg/dL — ABNORMAL HIGH (ref 0.44–1.00)
GFR calc Af Amer: 45 mL/min — ABNORMAL LOW (ref >60)
GFR calc non Af Amer: 38 mL/min — ABNORMAL LOW (ref >60)
Glucose, Bld: 89 mg/dL (ref 65–99)
POTASSIUM: 3.7 mmol/L (ref 3.5–5.1)
Sodium: 141 mmol/L (ref 135–145)

## 2016-08-28 MED ORDER — HYDROCODONE-ACETAMINOPHEN 5-325 MG PO TABS: 1.0000 | ORAL_TABLET | Freq: Four times a day (QID) | ORAL | 0 refills | Status: AC | PRN

## 2016-08-28 MED ORDER — POTASSIUM CHLORIDE CRYS ER 20 MEQ PO TBCR
40.0000 meq | EXTENDED_RELEASE_TABLET | Freq: Once | ORAL | Status: AC
  Administered 2016-08-28: 40 meq via ORAL
  Filled 2016-08-28: qty 2

## 2016-08-28 MED ORDER — ENOXAPARIN SODIUM 30 MG/0.3ML ~~LOC~~ SOLN: 30.0000 mg | SUBCUTANEOUS | Status: DC

## 2016-08-28 NOTE — Progress Notes (Signed)
Pt A and O x 4. VSS. Pt tolerating diet well. No complaints of pain or nausea. IV removed intact, prescriptions given. Pt voiced understanding of discharge instructions with no further questions. Pt given instruction in regards to the foley. Pt discharged via wheelchair with axillary.

## 2016-08-28 NOTE — Discharge Summary (Signed)
Patient ID: Katherine Medina MRN: [id number] DOB/AGE: 02/21/1929 82 y.o.  Admit date: 08/20/2016 Discharge date: 08/28/2016   Discharge Diagnoses:  Active Problems:   Malignant neoplasm of ascending colon (HCC)   Colon cancer ([place])   Procedures: Lap Right hemicolectomy and Lap TAH w BSO  Hospital Course: 82 year old female with a previous history of appendicitis and on the specimen there was a serrated adenoma. She underwent further workup revealing carcinoma on the cecum. And she also and near total abdominal hysterectomy by Dr. Leonides Schanz congratulations syndrome. She was scheduled for a combined approach and underwent hand-assisted laparoscopic right hemicolectomy as well as TAH. She had an immediate an uneventful postoperative course and was transferred to the floor. She did well for next 2 days but unfortunately in postoperative day #2 started having nausea and vomiting consistent with ileus and and NG tube had to be placed. She also developed urinary retention and an we were unable to remove the Foley. I discussed with urology and they recommended just to keep the Foley and off outpatient follow-up appointment. She continued to improve and an ileus resolved and showing no evidence of complications related to her surgery. Since she was 24 and she did have some deconditioning the hospital and took a little bit of time for or her strength to get back to baseline. She also needed some Lasix and to help her with some volume that she receives secondary to her ileus. At the time of discharge she was ambulating, she was tolerating a regular diet. She was passing gas and having bowel movements. Her physical exam revealed at early female in no acute distress. Awake, alert. Abdomen: Soft, incisions healing well without evidence of infection there was minimal and appropriate incisional tenderness. No peritonitis. A Foley was in place. Extremities: Pedal edema To the ankles. There were well-perfused. Condition of  the patient at time of discharge is stable. She will have a follow-up appointment with urology next week for a voiding trial. She will also have a follow-up appointment with Dr. Shyrl Numbers from [facility] as well as with me in the next couple of weeks. Final pathology showed invasive adenocarcinoma arising in a sessile serrated adenoma with high-grade dysplasia. No evidence of note involvement and there were 15 nodes within the specimen. Margins were negative.   Disposition: 01-Home or Self Care  Discharge Instructions    Call MD for:  difficulty breathing, headache or visual disturbances    Complete by:  As directed    Call MD for:  extreme fatigue    Complete by:  As directed    Call MD for:  hives    Complete by:  As directed    Call MD for:  persistant dizziness or light-headedness    Complete by:  As directed    Call MD for:  persistant nausea and vomiting    Complete by:  As directed    Call MD for:  redness, tenderness, or signs of infection (pain, swelling, redness, odor or green/yellow discharge around incision site)    Complete by:  As directed    Call MD for:  severe uncontrolled pain    Complete by:  As directed    Call MD for:  temperature >100.4    Complete by:  As directed    Diet - low sodium heart healthy    Complete by:  As directed    Discharge instructions    Complete by:  As directed    Please teach pt about foley care and  provide a leg bag. Gu to do voiding trial next week   Increase activity slowly    Complete by:  As directed    Lifting restrictions    Complete by:  As directed    20lbs x 6 weeks     Allergies as of 08/28/2016   No Known Allergies     Medication List    TAKE these medications   CALTRATE 600+D PO Take 1 tablet by mouth daily.   erythromycin base 500 MG tablet Commonly known as:  E-MYCIN Take 2 tablets (1,000 mg total) by mouth 3 (three) times daily.   HYDROcodone-acetaminophen 5-325 MG tablet Commonly known as:  NORCO/VICODIN Take 1-2  tablets by mouth every 6 (six) hours as needed for moderate pain.   neomycin 500 MG tablet Commonly known as:  MYCIFRADIN Take 2 tablets (1,000 mg total) by mouth 3 (three) times daily.   potassium chloride SA 20 MEQ tablet Commonly known as:  K-DUR,KLOR-CON Take 1 tablet (20 mEq total) by mouth 2 (two) times daily.      Follow-up Information    Maceo Pro, MD. Go on 10/02/2016.   Specialty:  Obstetrics and Gynecology Why:  Friday at 1:30pm for post op check Contact information: [place] [address] [phone]        Nickie Retort, MD. Go on 09/11/2016.   Specialty:  Urology Why:  Friday at 8:45am voiding trial.  Contact information: [address] [place] [address] [phone]        Phoebe Perch, MD On 09/10/2016.   Specialty:  Surgery Why:  Thursday at 10:45am for hospital follow-up Contact information: [place] [address] [phone]            Caroleen Hamman, MD FACS

## 2016-08-28 NOTE — Care Management Important Message (Signed)
Important Message  Patient Details  Name: KIKO FICO MRN: [id number] Date of Birth: Aug 23, 1929   Medicare Important Message Given:  Yes    Shelbie Ammons, RN 08/28/2016, 10:57 AM

## 2016-08-28 NOTE — Progress Notes (Signed)
Per Dr. Azalee Course okay to place order for potassium oral pills at same dose as liquid potassium

## 2016-08-28 NOTE — Care Management (Signed)
[facility] notified of dishcharge

## 2016-08-30 ENCOUNTER — Encounter (HOSPITAL_COMMUNITY): Payer: Self-pay | Admitting: Emergency Medicine

## 2016-08-30 ENCOUNTER — Emergency Department (HOSPITAL_COMMUNITY): Payer: Medicare Other

## 2016-08-30 ENCOUNTER — Emergency Department (HOSPITAL_COMMUNITY)
Admission: EM | Admit: 2016-08-30 | Discharge: 2016-08-31 | Disposition: A | Discharge status: Other Acute Inpatient Hospital | Payer: Medicare Other | Attending: Emergency Medicine | Admitting: Emergency Medicine

## 2016-08-30 DIAGNOSIS — R0602 Shortness of breath: Secondary | ICD-10-CM | POA: Diagnosis not present

## 2016-08-30 DIAGNOSIS — Z85038 Personal history of other malignant neoplasm of large intestine: Secondary | ICD-10-CM | POA: Diagnosis not present

## 2016-08-30 DIAGNOSIS — Z79899 Other long term (current) drug therapy: Secondary | ICD-10-CM | POA: Insufficient documentation

## 2016-08-30 DIAGNOSIS — I10 Essential (primary) hypertension: Secondary | ICD-10-CM | POA: Insufficient documentation

## 2016-08-30 DIAGNOSIS — K651 Peritoneal abscess: Secondary | ICD-10-CM | POA: Diagnosis not present

## 2016-08-30 DIAGNOSIS — R1084 Generalized abdominal pain: Secondary | ICD-10-CM | POA: Diagnosis present

## 2016-08-30 HISTORY — DX: Malignant (primary) neoplasm, unspecified: C80.1

## 2016-08-30 LAB — COMPREHENSIVE METABOLIC PANEL
ALBUMIN: 2.4 g/dL — AB (ref 3.5–5.0)
ALT: 14 U/L (ref 14–54)
ANION GAP: 12 (ref 5–15)
AST: 29 U/L (ref 15–41)
Alkaline Phosphatase: 75 U/L (ref 38–126)
BILIRUBIN TOTAL: 1.2 mg/dL (ref 0.3–1.2)
BUN: 16 mg/dL (ref 6–20)
CHLORIDE: 98 mmol/L — AB (ref 101–111)
CO2: 21 mmol/L — ABNORMAL LOW (ref 22–32)
Calcium: 7.9 mg/dL — ABNORMAL LOW (ref 8.9–10.3)
Creatinine, Ser: 1.42 mg/dL — ABNORMAL HIGH (ref 0.44–1.00)
GFR calc Af Amer: 38 mL/min — ABNORMAL LOW (ref >60)
GFR calc non Af Amer: 33 mL/min — ABNORMAL LOW (ref >60)
GLUCOSE: 135 mg/dL — AB (ref 65–99)
POTASSIUM: 4.5 mmol/L (ref 3.5–5.1)
Sodium: 131 mmol/L — ABNORMAL LOW (ref 135–145)
TOTAL PROTEIN: 6.2 g/dL — AB (ref 6.5–8.1)

## 2016-08-30 LAB — CBC WITH DIFFERENTIAL/PLATELET
BASOS ABS: 0.1 10*3/uL (ref 0.0–0.1)
BASOS PCT: 0 %
EOS ABS: 0.1 10*3/uL (ref 0.0–0.7)
EOS PCT: 1 %
HEMATOCRIT: 38 % (ref 36.0–46.0)
Hemoglobin: 12.8 g/dL (ref 12.0–15.0)
Lymphocytes Relative: 7 %
Lymphs Abs: 1.1 10*3/uL (ref 0.7–4.0)
MCH: 27.8 pg (ref 26.0–34.0)
MCHC: 33.7 g/dL (ref 30.0–36.0)
MCV: 82.4 fL (ref 78.0–100.0)
MONO ABS: 0.2 10*3/uL (ref 0.1–1.0)
MONOS PCT: 1 %
Neutro Abs: 15.2 10*3/uL — ABNORMAL HIGH (ref 1.7–7.7)
Neutrophils Relative %: 91 %
PLATELETS: 488 10*3/uL — AB (ref 150–400)
RBC: 4.61 MIL/uL (ref 3.87–5.11)
RDW: 14.9 % (ref 11.5–15.5)
WBC: 16.7 10*3/uL — ABNORMAL HIGH (ref 4.0–10.5)

## 2016-08-30 LAB — BRAIN NATRIURETIC PEPTIDE: B NATRIURETIC PEPTIDE 5: 56.5 pg/mL (ref 0.0–100.0)

## 2016-08-30 LAB — I-STAT CG4 LACTIC ACID, ED
LACTIC ACID, VENOUS: 3.64 mmol/L — AB (ref 0.5–1.9)
LACTIC ACID, VENOUS: 3.43 mmol/L — AB (ref 0.5–1.9)

## 2016-08-30 MED ORDER — SODIUM CHLORIDE 0.9 % IV BOLUS (SEPSIS)
1000.0000 mL | Freq: Once | INTRAVENOUS | Status: AC
  Administered 2016-08-30: 1000 mL via INTRAVENOUS

## 2016-08-30 MED ORDER — MORPHINE SULFATE (PF) 4 MG/ML IV SOLN
2.0000 mg | Freq: Once | INTRAVENOUS | Status: AC
  Administered 2016-08-30: 2 mg via INTRAVENOUS
  Filled 2016-08-30: qty 1

## 2016-08-30 MED ORDER — IOPAMIDOL (ISOVUE-370) INJECTION 76%
INTRAVENOUS | Status: AC
  Administered 2016-08-30: 75 mL
  Filled 2016-08-30: qty 100

## 2016-08-30 NOTE — ED Triage Notes (Signed)
Per [facility]: hysterectomy and part of colon removed on 12/7. Has had significant abdominal pain with shortness of breath. The abdominal pain went from the right to left in the mid abdomen. When EMS arrived they noted dome cyanosis at the finger tips oxygen saturation was 89% on room air. Pt was started on 4 L [place] with EMS and oxygen saturation improved to 94%. Pt does have urinary catheter. Urine is dark in color. Pt does does have rhonchi in the lower left lung field.

## 2016-08-30 NOTE — ED Notes (Signed)
EDP made aware of pt vitals s/p 1L NS bolus.

## 2016-08-30 NOTE — ED Provider Notes (Signed)
Katherine Medina Provider Note   CSN: [id number] Arrival date & time: 08/30/16  1902     History   Chief Complaint Chief Complaint  Patient presents with  . Abdominal Pain  . Respiratory Distress    HPI Katherine Medina is a 82 y.o. female.  The history is provided by the patient and the EMS personnel. No language interpreter was used.     Patient is an 82 year old female who presents today with shortness of breath, diffuse abdominal pain that started around 5 PM today. Patient is status post hemicolectomy and hysterectomy less than 1 week ago. This was performed at [facility]. Patient states she been doing well up until today. She began to have severe pain in her abdomen. EMS was called and they did note that the patient had O2 saturations in the low 80s on room air. Additionally they noted that she was tachycardic and she was brought here for emergent evaluation. Patient denies any recent fevers, chills, nausea, vomiting, diarrhea. She states that she's been having normal bowel movements after the surgery. She's not had any chest pain but does endorse shortness of breath. She also has bilateral lower extremity edema as well which she states is not typical for her. Patient has a history of colon cancer and this is the purpose of why she had recent hemicolectomy and hysterectomy. Palpation and movement makes her pain significantly worse. No alleviating factors.  Past Medical History:  Diagnosis Date  . Cancer ([place])   . Complication of anesthesia    BP dropped, Dr. Staci Acosta had to stop the procedure.  . Hypotension   . Medical history non-contributory     Patient Active Problem List   Diagnosis Date Noted  . Colon cancer ([place]) 08/20/2016  . Malignant neoplasm of ascending colon ([place])   . Thickened endometrium 06/12/2016  . Colon polyps 05/05/2016  . Appendicitis with perforation 04/01/2016    Past Surgical History:  Procedure Laterality Date  . [place]  . COLON RESECTION N/A 08/20/2016   Procedure: HAND ASSISTED LAPAROSCOPIC COLON RESECTION/ HEMI COLECTOMY;  Surgeon: Jules Husbands, MD;  Location: [facility] ORS;  Service: General;  Laterality: N/A;  . COLONOSCOPY    . COLONOSCOPY WITH PROPOFOL N/A 06/30/2016   Procedure: COLONOSCOPY WITH PROPOFOL;  Surgeon: Lollie Sails, MD;  Location: [facility] ENDOSCOPY;  Service: Endoscopy;  Laterality: N/A;  . EYE SURGERY     cataract with IOL Bilateral  . HYSTEROSCOPY W/D&C N/A 07/10/2016   Procedure: DILATATION AND CURETTAGE /HYSTEROSCOPY WITH ENDOMETRIAL BIOPSY;  Surgeon: Honor Loh Ward, MD;  Location: [facility] ORS;  Service: Gynecology;  Laterality: N/A;  . LAPAROSCOPIC APPENDECTOMY N/A 04/01/2016   Procedure: APPENDECTOMY LAPAROSCOPIC;  Surgeon: Jules Husbands, MD;  Location: [facility] ORS;  Service: General;  Laterality: N/A;  . LAPAROSCOPIC HYSTERECTOMY N/A 08/20/2016   Procedure: HYSTERECTOMY TOTAL LAPAROSCOPIC WITH BSO;  Surgeon: Honor Loh Ward, MD;  Location: [facility] ORS;  Service: Gynecology;  Laterality: N/A;  . PARTIAL COLECTOMY N/A 08/20/2016   Procedure: PARTIAL COLECTOMY;  Surgeon: Jules Husbands, MD;  Location: [facility] ORS;  Service: General;  Laterality: N/A;    OB History    Gravida Para Term Preterm AB Living   5 5 5  0 0 5   SAB TAB Ectopic Multiple Live Births   0 0 0 0         Home Medications    Prior to Admission medications   Medication Sig Start Date End  Date Taking? Authorizing Provider  acetaminophen (TYLENOL) 500 MG tablet Take 500-1,000 mg by mouth every 6 (six) hours as needed (for pain).   Yes Historical Provider, MD  Calcium Carbonate-Vitamin D (CALTRATE 600+D PO) Take 1 tablet by mouth daily.   Yes Historical Provider, MD  HYDROcodone-acetaminophen (NORCO/VICODIN) 5-325 MG tablet Take 1-2 tablets by mouth every 6 (six) hours as needed for moderate pain. 08/28/16  Yes Diego F Pabon, MD  erythromycin base (E-MYCIN) 500 MG tablet Take 2 tablets (1,000 mg total) by  mouth 3 (three) times daily. Patient not taking: Reported on 08/30/2016 07/24/16   Jules Husbands, MD  neomycin (MYCIFRADIN) 500 MG tablet Take 2 tablets (1,000 mg total) by mouth 3 (three) times daily. Patient not taking: Reported on 08/30/2016 07/24/16   Jules Husbands, MD  potassium chloride SA (K-DUR,KLOR-CON) 20 MEQ tablet Take 1 tablet (20 mEq total) by mouth 2 (two) times daily. Patient not taking: Reported on 08/30/2016 08/11/16   Jules Husbands, MD    Family History Family History  Problem Relation Age of Onset  . Diabetes Mother   . Hypertension Father   . Colon cancer Neg Hx     Social History Social History  Substance Use Topics  . Smoking status: Never Smoker  . Smokeless tobacco: Never Used  . Alcohol use No     Allergies   Patient has no known allergies.   Review of Systems Review of Systems  Constitutional: Positive for fatigue. Negative for fever.  HENT: Negative for congestion and rhinorrhea.   Respiratory: Positive for shortness of breath.   Cardiovascular: Positive for leg swelling (bilateral lower extremities). Negative for chest pain.  Gastrointestinal: Positive for abdominal pain (severe, diffuse). Negative for constipation, diarrhea and vomiting.  Genitourinary: Negative for dysuria and hematuria.  Skin: Positive for pallor (reported by EMS on their arrival). Negative for rash.  Neurological: Negative for weakness and numbness.  Psychiatric/Behavioral: Negative for agitation and confusion.  All other systems reviewed and are negative.    Physical Exam Updated Vital Signs BP (!) 102/54   Pulse 112   Temp 97.6 F (36.4 C) (Oral)   Resp (!) 33   Ht 5\' 2"  (1.575 m)   Wt 61.7 kg   SpO2 93%   BMI 24.87 kg/m   Physical Exam  Constitutional: She is oriented to person, place, and time. She appears distressed.  Elderly appearing, appears uncomfortable.   HENT:  Head: Normocephalic and atraumatic.  Eyes: Conjunctivae and EOM are normal.  Neck:  Normal range of motion. Neck supple.  Cardiovascular: Tachycardia present.   Pulmonary/Chest: She is in respiratory distress (mild). She has no wheezes. She has no rales.  Abdominal: She exhibits no mass. There is tenderness (diffuse ttp). There is guarding.  Wound sites appear clean and dry  Musculoskeletal: Normal range of motion. She exhibits edema (pitting edema bilateral lower extremities).  Neurological: She is alert and oriented to person, place, and time.  Skin: Skin is warm and dry. She is not diaphoretic.  Nursing note and vitals reviewed.    ED Treatments / Results  Labs (all labs ordered are listed, but only abnormal results are displayed) Labs Reviewed  CBC WITH DIFFERENTIAL/PLATELET - Abnormal; Notable for the following:       Result Value   WBC 16.7 (*)    Platelets 488 (*)    Neutro Abs 15.2 (*)    All other components within normal limits  COMPREHENSIVE METABOLIC PANEL - Abnormal; Notable for the  following:    Sodium 131 (*)    Chloride 98 (*)    CO2 21 (*)    Glucose, Bld 135 (*)    Creatinine, Ser 1.42 (*)    Calcium 7.9 (*)    Total Protein 6.2 (*)    Albumin 2.4 (*)    GFR calc non Af Amer 33 (*)    GFR calc Af Amer 38 (*)    All other components within normal limits  I-STAT CG4 LACTIC ACID, ED - Abnormal; Notable for the following:    Lactic Acid, Venous 3.64 (*)    All other components within normal limits  I-STAT CG4 LACTIC ACID, ED - Abnormal; Notable for the following:    Lactic Acid, Venous 3.43 (*)    All other components within normal limits  BRAIN NATRIURETIC PEPTIDE    EKG  EKG Interpretation  Date/Time:  Sunday August 30 2016 19:16:42 EST Ventricular Rate:  125 PR Interval:    QRS Duration: 69 QT Interval:  295 QTC Calculation: 426 R Axis:   10 Text Interpretation:  Sinus tachycardia Ventricular premature complex Aberrant conduction of SV complex(es) Probable left atrial enlargement When compared with ECG of 07/02/2016, HEART RATE  has increased Confirmed by Roxanne Mins  MD, DAVID ([id number]) on 08/30/2016 7:19:16 PM       Radiology Ct Angio Chest Pe W And/or Wo Contrast  Result Date: 08/30/2016 CLINICAL DATA:  82 year old female with shortness of breath and tachycardia. Hypoxia. Recent surgery. History of colon cancer and recent colonoscopy. EXAM: CT ANGIOGRAPHY CHEST CT ABDOMEN AND PELVIS WITH CONTRAST TECHNIQUE: Multidetector CT imaging of the chest was performed using the standard protocol during bolus administration of intravenous contrast. Multiplanar CT image reconstructions and MIPs were obtained to evaluate the vascular anatomy. Multidetector CT imaging of the abdomen and pelvis was performed using the standard protocol during bolus administration of intravenous contrast. CONTRAST:  75 cc Isovue 370 COMPARISON:  Chest radiograph dated 08/20/2016 abdominal radiograph dated 08/24/2016. Comparison is also made CT CT of the abdomen pelvis dated 07/31/2016 FINDINGS: CTA CHEST FINDINGS Cardiovascular: There is no cardiomegaly or pericardial effusion. There is slight displacement of the heart anteriorly and to the left hemithorax secondary to mass effect caused by the large hiatal hernia. There is atherosclerotic calcification of the thoracic aorta and aortic arch. No aneurysmal dilatation or evidence of dissection. The origins of the great vessels of the aortic arch appear patent. No CT evidence of pulmonary embolism. Mediastinum/Nodes: There is no hilar or mediastinal adenopathy. There is free large hiatal hernia containing the majority of the stomach with organo-axial gastric volvulus. There is air distention of the herniated portion of the stomach without definite evidence of gastric outlet obstruction. The esophagus is grossly unremarkable. The thyroid gland is atrophic. No thyroid nodules identified. Lungs/Pleura: There are small bilateral pleural effusions with associated partial compressive consolidation of the lower lobes, new since  the CT of 07/31/2016. Pneumonia is not excluded. Clinical correlation and follow-up to resolution is recommended. There is no pneumothorax. The central airways are patent. Musculoskeletal: There is no axillary adenopathy. Mild diffuse subcutaneous edema. The chest wall soft tissues are otherwise unremarkable. The there is osteopenia with degenerative changes of the spine. No acute fracture. Review of the MIP images confirms the above findings. CT ABDOMEN and PELVIS FINDINGS No intra-abdominal free air. Small ascites. There is a 12 x 12 cm fluid collection within the pelvis which appears loculated with enhancing walls concerning for an infected collection and developing abscess. Hepatobiliary:  A 1.6 x 1.9 cm focal nodular enhancement in the inferior aspect of the right lobe of the liver (series 501 image 34 is incompletely characterized but most likely represents a portal venous shunting versus a hemangioma. Other etiologies are not excluded. This appears similar to prior studies. There is no intrahepatic biliary ductal dilatation. The gallbladder is mildly distended. There is small amount of layering sludge in the gallbladder fundus. No pericholecystic fluid. Pancreas: A 1 cm low attenuating/cystic listen arising from the anterior aspect of the head of the pancreas is incompletely characterized but may represent a side branch IPMN or retention cyst. Other etiologies are not excluded. MRI may provide better characterisation. There is no dilatation of the main pancreatic duct or associated gland atrophy. Spleen: Normal in size without focal abnormality. Adrenals/Urinary Tract: The adrenal glands, kidneys, visualized ureters appear unremarkable. The urinary bladder is decompressed around a Foley catheter. Stomach/Bowel: There is postsurgical changes of right hemicolectomy with ileotransverse anastomosis in the mid abdomen. Multiple mildly dilated and thickened loops of small bowel throughout the abdomen most compatible  with a degree of ileus. Superimposed enteritis is not excluded. Clinical correlation is recommended. There is abutment of bowel loops to the anterior peritoneal wall compatible with adhesions. There is extensive sigmoid diverticulosis with muscular hypertrophy. No active inflammatory changes. Evaluation of the bowel is limited in the absence of oral contrast. Vascular/Lymphatic: There is moderate aortoiliac atherosclerotic disease. The origins of the celiac axis, SMA, IMA as well as the origins of the renal arteries are patent. The SMV, splenic vein, and main portal vein are patent. No portal venous gas identified. There is no adenopathy. Reproductive: Hysterectomy. Other: There is diffuse mesenteric edema as well as diffuse subcutaneous edema and anasarca. Musculoskeletal: Multilevel degenerative changes of spine. Osteopenia. No acute fracture. IMPRESSION: Interval development of small bilateral pleural effusions with associated partial consolidative changes of the lung bases which may represent atelectasis versus infiltrate. Clinical correlation and follow-up to resolution recommended. No CT evidence of aortic dissection or pulmonary embolism. Large hiatal hernia containing the majority of the stomach. Postsurgical changes of right hemicolectomy with ileotransverse anastomosis. Mildly distended thickened loops of small bowel most compatible with an ileus. Enteritis is not excluded. Clinical correlation is recommended. No evidence of bowel obstruction. Sigmoid diverticulosis. Small ascites, diffuse mesenteric edema and anasarca. Loculated appearing fluid within the pelvis with enhancing wall concerning for an infected collection or developing abscess. Clinical correlation is recommended. Electronically Signed   By: Anner Crete M.D.   On: 08/30/2016 22:02   Ct Abdomen Pelvis W Contrast  Result Date: 08/30/2016 CLINICAL DATA:  82 year old female with shortness of breath and tachycardia. Hypoxia. Recent  surgery. History of colon cancer and recent colonoscopy. EXAM: CT ANGIOGRAPHY CHEST CT ABDOMEN AND PELVIS WITH CONTRAST TECHNIQUE: Multidetector CT imaging of the chest was performed using the standard protocol during bolus administration of intravenous contrast. Multiplanar CT image reconstructions and MIPs were obtained to evaluate the vascular anatomy. Multidetector CT imaging of the abdomen and pelvis was performed using the standard protocol during bolus administration of intravenous contrast. CONTRAST:  75 cc Isovue 370 COMPARISON:  Chest radiograph dated 08/20/2016 abdominal radiograph dated 08/24/2016. Comparison is also made CT CT of the abdomen pelvis dated 07/31/2016 FINDINGS: CTA CHEST FINDINGS Cardiovascular: There is no cardiomegaly or pericardial effusion. There is slight displacement of the heart anteriorly and to the left hemithorax secondary to mass effect caused by the large hiatal hernia. There is atherosclerotic calcification of the thoracic aorta and aortic  arch. No aneurysmal dilatation or evidence of dissection. The origins of the great vessels of the aortic arch appear patent. No CT evidence of pulmonary embolism. Mediastinum/Nodes: There is no hilar or mediastinal adenopathy. There is free large hiatal hernia containing the majority of the stomach with organo-axial gastric volvulus. There is air distention of the herniated portion of the stomach without definite evidence of gastric outlet obstruction. The esophagus is grossly unremarkable. The thyroid gland is atrophic. No thyroid nodules identified. Lungs/Pleura: There are small bilateral pleural effusions with associated partial compressive consolidation of the lower lobes, new since the CT of 07/31/2016. Pneumonia is not excluded. Clinical correlation and follow-up to resolution is recommended. There is no pneumothorax. The central airways are patent. Musculoskeletal: There is no axillary adenopathy. Mild diffuse subcutaneous edema. The  chest wall soft tissues are otherwise unremarkable. The there is osteopenia with degenerative changes of the spine. No acute fracture. Review of the MIP images confirms the above findings. CT ABDOMEN and PELVIS FINDINGS No intra-abdominal free air. Small ascites. There is a 12 x 12 cm fluid collection within the pelvis which appears loculated with enhancing walls concerning for an infected collection and developing abscess. Hepatobiliary: A 1.6 x 1.9 cm focal nodular enhancement in the inferior aspect of the right lobe of the liver (series 501 image 34 is incompletely characterized but most likely represents a portal venous shunting versus a hemangioma. Other etiologies are not excluded. This appears similar to prior studies. There is no intrahepatic biliary ductal dilatation. The gallbladder is mildly distended. There is small amount of layering sludge in the gallbladder fundus. No pericholecystic fluid. Pancreas: A 1 cm low attenuating/cystic listen arising from the anterior aspect of the head of the pancreas is incompletely characterized but may represent a side branch IPMN or retention cyst. Other etiologies are not excluded. MRI may provide better characterisation. There is no dilatation of the main pancreatic duct or associated gland atrophy. Spleen: Normal in size without focal abnormality. Adrenals/Urinary Tract: The adrenal glands, kidneys, visualized ureters appear unremarkable. The urinary bladder is decompressed around a Foley catheter. Stomach/Bowel: There is postsurgical changes of right hemicolectomy with ileotransverse anastomosis in the mid abdomen. Multiple mildly dilated and thickened loops of small bowel throughout the abdomen most compatible with a degree of ileus. Superimposed enteritis is not excluded. Clinical correlation is recommended. There is abutment of bowel loops to the anterior peritoneal wall compatible with adhesions. There is extensive sigmoid diverticulosis with muscular  hypertrophy. No active inflammatory changes. Evaluation of the bowel is limited in the absence of oral contrast. Vascular/Lymphatic: There is moderate aortoiliac atherosclerotic disease. The origins of the celiac axis, SMA, IMA as well as the origins of the renal arteries are patent. The SMV, splenic vein, and main portal vein are patent. No portal venous gas identified. There is no adenopathy. Reproductive: Hysterectomy. Other: There is diffuse mesenteric edema as well as diffuse subcutaneous edema and anasarca. Musculoskeletal: Multilevel degenerative changes of spine. Osteopenia. No acute fracture. IMPRESSION: Interval development of small bilateral pleural effusions with associated partial consolidative changes of the lung bases which may represent atelectasis versus infiltrate. Clinical correlation and follow-up to resolution recommended. No CT evidence of aortic dissection or pulmonary embolism. Large hiatal hernia containing the majority of the stomach. Postsurgical changes of right hemicolectomy with ileotransverse anastomosis. Mildly distended thickened loops of small bowel most compatible with an ileus. Enteritis is not excluded. Clinical correlation is recommended. No evidence of bowel obstruction. Sigmoid diverticulosis. Small ascites, diffuse mesenteric edema and  anasarca. Loculated appearing fluid within the pelvis with enhancing wall concerning for an infected collection or developing abscess. Clinical correlation is recommended. Electronically Signed   By: Anner Crete M.D.   On: 08/30/2016 22:02    Procedures Procedures (including critical care time)  Medications Ordered in ED Medications  morphine 4 MG/ML injection 2 mg (2 mg Intravenous Given 08/30/16 1933)  iopamidol (ISOVUE-370) 76 % injection (75 mLs  Contrast Given 08/30/16 2014)  sodium chloride 0.9 % bolus 1,000 mL (0 mLs Intravenous Stopped 08/30/16 2148)  morphine 4 MG/ML injection 2 mg (2 mg Intravenous Given 08/30/16 2104)   sodium chloride 0.9 % bolus 1,000 mL (0 mLs Intravenous Stopped 08/30/16 2300)     Initial Impression / Assessment and Plan / ED Course  I have reviewed the triage vital signs and the nursing notes.  Pertinent labs & imaging results that were available during my care of the patient were reviewed by me and considered in my medical decision making (see chart for details).  Clinical Course     Patient is an 82 year old female who presents today with shortness of breath, abdominal pain that started suddenly earlier today. On arrival the patient does appear ill. Vital signs notable for tachycardia. She has peritoneal signs on my exam. Additionally given her leg swelling, recent surgery, shortness of breath he was also included in the differential. Her recent surgery also has me concerned for possible deep abscess that could be causing her peritoneal symptoms.   Obtain CTA of her chest and CT abdomen and pelvis. On review the scans, the patient has a 12 x 12 cm loculation in her pelvis which is concerning for abscess formation. I have contacted with surgery here, they evaluated the patient and the patient at this time requests transfer to see surgeon that performed her surgery at [facility]. Our surgeon here contacted their hospital and spoke with Dr. Phoebe Perch. They're agreeable to accept the patient on an ED to ED transfer. Patient is stable here and pain has been controlled with small doses of morphine. She has received 2 L of IV fluid since her arrival.  Family has been updated on the plantar transfer the patient [place] [facility] for further evaluation.  Final Clinical Impressions(s) / ED Diagnoses   Final diagnoses:  Intra-abdominal abscess [facility])    New Prescriptions New Prescriptions   No medications on file     Theodosia Quay, [place] [id number]    Delora Fuel, [place] [id number]

## 2016-08-31 ENCOUNTER — Inpatient Hospital Stay
Admission: EM | Admit: 2016-08-31 | Discharge: 2016-09-14 | DRG: 856 | Disposition: E | Payer: Medicare Other | Attending: Surgery | Admitting: Surgery

## 2016-08-31 ENCOUNTER — Inpatient Hospital Stay: Payer: Medicare Other

## 2016-08-31 ENCOUNTER — Inpatient Hospital Stay: Payer: Medicare Other | Admitting: Anesthesiology

## 2016-08-31 ENCOUNTER — Encounter: Admission: EM | Disposition: E | Payer: Self-pay | Source: Home / Self Care | Attending: Surgery

## 2016-08-31 DIAGNOSIS — R1084 Generalized abdominal pain: Secondary | ICD-10-CM

## 2016-08-31 DIAGNOSIS — Z90710 Acquired absence of both cervix and uterus: Secondary | ICD-10-CM

## 2016-08-31 DIAGNOSIS — K632 Fistula of intestine: Secondary | ICD-10-CM | POA: Diagnosis present

## 2016-08-31 DIAGNOSIS — D62 Acute posthemorrhagic anemia: Secondary | ICD-10-CM | POA: Diagnosis not present

## 2016-08-31 DIAGNOSIS — K651 Peritoneal abscess: Secondary | ICD-10-CM | POA: Diagnosis not present

## 2016-08-31 DIAGNOSIS — C182 Malignant neoplasm of ascending colon: Secondary | ICD-10-CM | POA: Diagnosis present

## 2016-08-31 DIAGNOSIS — J90 Pleural effusion, not elsewhere classified: Secondary | ICD-10-CM

## 2016-08-31 DIAGNOSIS — N368 Other specified disorders of urethra: Secondary | ICD-10-CM | POA: Diagnosis present

## 2016-08-31 DIAGNOSIS — Y95 Nosocomial condition: Secondary | ICD-10-CM | POA: Diagnosis not present

## 2016-08-31 DIAGNOSIS — K9189 Other postprocedural complications and disorders of digestive system: Secondary | ICD-10-CM | POA: Diagnosis not present

## 2016-08-31 DIAGNOSIS — Z0189 Encounter for other specified special examinations: Secondary | ICD-10-CM

## 2016-08-31 DIAGNOSIS — S3719XA Other injury of ureter, initial encounter: Secondary | ICD-10-CM | POA: Diagnosis present

## 2016-08-31 DIAGNOSIS — T8143XA Infection following a procedure, organ and space surgical site, initial encounter: Secondary | ICD-10-CM

## 2016-08-31 DIAGNOSIS — J9600 Acute respiratory failure, unspecified whether with hypoxia or hypercapnia: Secondary | ICD-10-CM

## 2016-08-31 DIAGNOSIS — Z79899 Other long term (current) drug therapy: Secondary | ICD-10-CM | POA: Diagnosis not present

## 2016-08-31 DIAGNOSIS — N739 Female pelvic inflammatory disease, unspecified: Secondary | ICD-10-CM

## 2016-08-31 DIAGNOSIS — J9690 Respiratory failure, unspecified, unspecified whether with hypoxia or hypercapnia: Secondary | ICD-10-CM

## 2016-08-31 DIAGNOSIS — R0602 Shortness of breath: Secondary | ICD-10-CM

## 2016-08-31 DIAGNOSIS — T814XXA Infection following a procedure, initial encounter: Principal | ICD-10-CM | POA: Diagnosis present

## 2016-08-31 DIAGNOSIS — Z9049 Acquired absence of other specified parts of digestive tract: Secondary | ICD-10-CM | POA: Diagnosis not present

## 2016-08-31 DIAGNOSIS — R0603 Acute respiratory distress: Secondary | ICD-10-CM | POA: Diagnosis not present

## 2016-08-31 DIAGNOSIS — E876 Hypokalemia: Secondary | ICD-10-CM | POA: Diagnosis present

## 2016-08-31 DIAGNOSIS — R0600 Dyspnea, unspecified: Secondary | ICD-10-CM | POA: Diagnosis present

## 2016-08-31 DIAGNOSIS — E43 Unspecified severe protein-calorie malnutrition: Secondary | ICD-10-CM | POA: Diagnosis present

## 2016-08-31 DIAGNOSIS — K567 Ileus, unspecified: Secondary | ICD-10-CM | POA: Diagnosis not present

## 2016-08-31 DIAGNOSIS — S3710XA Unspecified injury of ureter, initial encounter: Secondary | ICD-10-CM

## 2016-08-31 DIAGNOSIS — Z452 Encounter for adjustment and management of vascular access device: Secondary | ICD-10-CM

## 2016-08-31 DIAGNOSIS — Z66 Do not resuscitate: Secondary | ICD-10-CM | POA: Diagnosis not present

## 2016-08-31 DIAGNOSIS — R262 Difficulty in walking, not elsewhere classified: Secondary | ICD-10-CM

## 2016-08-31 DIAGNOSIS — T8149XA Infection following a procedure, other surgical site, initial encounter: Secondary | ICD-10-CM

## 2016-08-31 DIAGNOSIS — Z833 Family history of diabetes mellitus: Secondary | ICD-10-CM | POA: Diagnosis not present

## 2016-08-31 DIAGNOSIS — Z85038 Personal history of other malignant neoplasm of large intestine: Secondary | ICD-10-CM | POA: Diagnosis not present

## 2016-08-31 DIAGNOSIS — Z6824 Body mass index (BMI) 24.0-24.9, adult: Secondary | ICD-10-CM

## 2016-08-31 DIAGNOSIS — A419 Sepsis, unspecified organism: Secondary | ICD-10-CM | POA: Diagnosis present

## 2016-08-31 DIAGNOSIS — R2681 Unsteadiness on feet: Secondary | ICD-10-CM

## 2016-08-31 DIAGNOSIS — R188 Other ascites: Secondary | ICD-10-CM | POA: Diagnosis present

## 2016-08-31 DIAGNOSIS — N179 Acute kidney failure, unspecified: Secondary | ICD-10-CM | POA: Diagnosis present

## 2016-08-31 DIAGNOSIS — J189 Pneumonia, unspecified organism: Secondary | ICD-10-CM | POA: Diagnosis not present

## 2016-08-31 DIAGNOSIS — T8140XA Infection following a procedure, unspecified, initial encounter: Secondary | ICD-10-CM

## 2016-08-31 DIAGNOSIS — S3713XA Laceration of ureter, initial encounter: Secondary | ICD-10-CM

## 2016-08-31 DIAGNOSIS — Z8249 Family history of ischemic heart disease and other diseases of the circulatory system: Secondary | ICD-10-CM | POA: Diagnosis not present

## 2016-08-31 DIAGNOSIS — Y848 Other medical procedures as the cause of abnormal reaction of the patient, or of later complication, without mention of misadventure at the time of the procedure: Secondary | ICD-10-CM | POA: Diagnosis present

## 2016-08-31 DIAGNOSIS — I4891 Unspecified atrial fibrillation: Secondary | ICD-10-CM | POA: Diagnosis not present

## 2016-08-31 DIAGNOSIS — J9601 Acute respiratory failure with hypoxia: Secondary | ICD-10-CM | POA: Diagnosis not present

## 2016-08-31 DIAGNOSIS — Z9889 Other specified postprocedural states: Secondary | ICD-10-CM

## 2016-08-31 DIAGNOSIS — Z419 Encounter for procedure for purposes other than remedying health state, unspecified: Secondary | ICD-10-CM

## 2016-08-31 DIAGNOSIS — K449 Diaphragmatic hernia without obstruction or gangrene: Secondary | ICD-10-CM | POA: Diagnosis present

## 2016-08-31 DIAGNOSIS — I10 Essential (primary) hypertension: Secondary | ICD-10-CM | POA: Diagnosis not present

## 2016-08-31 DIAGNOSIS — D638 Anemia in other chronic diseases classified elsewhere: Secondary | ICD-10-CM | POA: Diagnosis present

## 2016-08-31 DIAGNOSIS — R0902 Hypoxemia: Secondary | ICD-10-CM

## 2016-08-31 DIAGNOSIS — M6281 Muscle weakness (generalized): Secondary | ICD-10-CM

## 2016-08-31 DIAGNOSIS — Z515 Encounter for palliative care: Secondary | ICD-10-CM | POA: Diagnosis not present

## 2016-08-31 DIAGNOSIS — D649 Anemia, unspecified: Secondary | ICD-10-CM | POA: Diagnosis not present

## 2016-08-31 HISTORY — PX: LAPAROTOMY: SHX154

## 2016-08-31 HISTORY — PX: CYSTOSCOPY WITH RETROGRADE PYELOGRAM, URETEROSCOPY AND STENT PLACEMENT: SHX5789

## 2016-08-31 LAB — BLOOD GAS, ARTERIAL
Acid-base deficit: 1.1 mmol/L (ref 0.0–2.0)
Bicarbonate: 21.8 mmol/L (ref 20.0–28.0)
Delivery systems: POSITIVE
Expiratory PAP: 5
FIO2: 1
Inspiratory PAP: 10
O2 Saturation: 98.4 %
Patient temperature: 37
pCO2 arterial: 30 mmHg — ABNORMAL LOW (ref 32.0–48.0)
pH, Arterial: 7.47 — ABNORMAL HIGH (ref 7.350–7.450)
pO2, Arterial: 105 mmHg (ref 83.0–108.0)

## 2016-08-31 LAB — BASIC METABOLIC PANEL WITH GFR
Anion gap: 7 (ref 5–15)
BUN: 19 mg/dL (ref 6–20)
CO2: 23 mmol/L (ref 22–32)
Calcium: 7.2 mg/dL — ABNORMAL LOW (ref 8.9–10.3)
Chloride: 109 mmol/L (ref 101–111)
Creatinine, Ser: 1.31 mg/dL — ABNORMAL HIGH (ref 0.44–1.00)
GFR calc Af Amer: 42 mL/min — ABNORMAL LOW
GFR calc non Af Amer: 36 mL/min — ABNORMAL LOW
Glucose, Bld: 114 mg/dL — ABNORMAL HIGH (ref 65–99)
Potassium: 3.9 mmol/L (ref 3.5–5.1)
Sodium: 139 mmol/L (ref 135–145)

## 2016-08-31 LAB — I-STAT ARTERIAL BLOOD GAS, ED
Acid-base deficit: 3 mmol/L — ABNORMAL HIGH (ref 0.0–2.0)
BICARBONATE: 20.4 mmol/L (ref 20.0–28.0)
O2 SAT: 95 %
PCO2 ART: 31.6 mmHg — AB (ref 32.0–48.0)
TCO2: 21 mmol/L (ref 0–100)
pH, Arterial: 7.417 (ref 7.350–7.450)
pO2, Arterial: 73 mmHg — ABNORMAL LOW (ref 83.0–108.0)

## 2016-08-31 LAB — I-STAT CG4 LACTIC ACID, ED: Lactic Acid, Venous: 2.81 mmol/L (ref 0.5–1.9)

## 2016-08-31 LAB — CBC
HCT: 34 % — ABNORMAL LOW (ref 35.0–47.0)
Hemoglobin: 11.3 g/dL — ABNORMAL LOW (ref 12.0–16.0)
MCH: 27.3 pg (ref 26.0–34.0)
MCHC: 33.1 g/dL (ref 32.0–36.0)
MCV: 82.4 fL (ref 80.0–100.0)
Platelets: 387 10*3/uL (ref 150–440)
RBC: 4.13 MIL/uL (ref 3.80–5.20)
RDW: 14.7 % — ABNORMAL HIGH (ref 11.5–14.5)
WBC: 45.3 10*3/uL — ABNORMAL HIGH (ref 3.6–11.0)

## 2016-08-31 LAB — PROTIME-INR
INR: 1.45
Prothrombin Time: 17.8 s — ABNORMAL HIGH (ref 11.4–15.2)

## 2016-08-31 LAB — GLUCOSE, CAPILLARY: Glucose-Capillary: 111 mg/dL — ABNORMAL HIGH (ref 65–99)

## 2016-08-31 LAB — MRSA PCR SCREENING: MRSA BY PCR: NEGATIVE

## 2016-08-31 LAB — APTT: aPTT: 48 s — ABNORMAL HIGH (ref 24–36)

## 2016-08-31 SURGERY — CYSTOURETEROSCOPY, WITH RETROGRADE PYELOGRAM AND STENT INSERTION
Anesthesia: General | Site: Abdomen | Wound class: Clean Contaminated

## 2016-08-31 MED ORDER — PIPERACILLIN-TAZOBACTAM 3.375 G IVPB
3.3750 g | Freq: Three times a day (TID) | INTRAVENOUS | Status: DC
  Administered 2016-08-31 – 2016-09-11 (×32): 3.375 g via INTRAVENOUS
  Filled 2016-08-31 (×33): qty 50

## 2016-08-31 MED ORDER — EPHEDRINE SULFATE 50 MG/ML IJ SOLN
INTRAMUSCULAR | Status: DC | PRN
  Administered 2016-08-31: 5 mg via INTRAVENOUS

## 2016-08-31 MED ORDER — IOTHALAMATE MEGLUMINE 43 % IV SOLN
INTRAVENOUS | Status: DC | PRN
  Administered 2016-08-31: 13 mL

## 2016-08-31 MED ORDER — MIDAZOLAM HCL 2 MG/2ML IJ SOLN
INTRAMUSCULAR | Status: AC | PRN
  Administered 2016-08-31: 0.5 mg via INTRAVENOUS

## 2016-08-31 MED ORDER — FENTANYL CITRATE (PF) 100 MCG/2ML IJ SOLN
25.0000 ug | Freq: Once | INTRAMUSCULAR | Status: DC
  Administered 2016-08-31: 100 ug via INTRAVENOUS

## 2016-08-31 MED ORDER — VANCOMYCIN HCL IN DEXTROSE 1-5 GM/200ML-% IV SOLN
1000.0000 mg | INTRAVENOUS | Status: AC
  Administered 2016-08-31: 1000 mg via INTRAVENOUS
  Filled 2016-08-31: qty 200

## 2016-08-31 MED ORDER — HEPARIN SODIUM (PORCINE) 5000 UNIT/ML IJ SOLN
5000.0000 [IU] | Freq: Three times a day (TID) | INTRAMUSCULAR | Status: DC
  Administered 2016-08-31 – 2016-09-13 (×37): 5000 [IU] via SUBCUTANEOUS
  Filled 2016-08-31 (×39): qty 1

## 2016-08-31 MED ORDER — FENTANYL CITRATE (PF) 100 MCG/2ML IJ SOLN
INTRAMUSCULAR | Status: AC
  Administered 2016-08-31: 100 ug via INTRAVENOUS
  Filled 2016-08-31: qty 2

## 2016-08-31 MED ORDER — BUPIVACAINE LIPOSOME 1.3 % IJ SUSP
INTRAMUSCULAR | Status: AC
  Filled 2016-08-31: qty 20

## 2016-08-31 MED ORDER — HYDROCODONE-ACETAMINOPHEN 5-325 MG PO TABS
1.0000 | ORAL_TABLET | Freq: Four times a day (QID) | ORAL | Status: DC | PRN
  Administered 2016-08-31 – 2016-09-02 (×3): 1 via ORAL
  Filled 2016-08-31 (×2): qty 1
  Filled 2016-08-31: qty 2

## 2016-08-31 MED ORDER — SODIUM CHLORIDE 0.9 % IV BOLUS (SEPSIS)
1000.0000 mL | Freq: Once | INTRAVENOUS | Status: AC
  Administered 2016-08-31: 1000 mL via INTRAVENOUS

## 2016-08-31 MED ORDER — FENTANYL CITRATE (PF) 100 MCG/2ML IJ SOLN
INTRAMUSCULAR | Status: DC | PRN
  Administered 2016-08-31 (×2): 50 ug via INTRAVENOUS
  Administered 2016-08-31: 100 ug via INTRAVENOUS
  Administered 2016-09-01: 50 ug via INTRAVENOUS

## 2016-08-31 MED ORDER — ONDANSETRON HCL 4 MG/2ML IJ SOLN
INTRAMUSCULAR | Status: DC | PRN
  Administered 2016-08-31: 4 mg via INTRAVENOUS

## 2016-08-31 MED ORDER — ROCURONIUM BROMIDE 100 MG/10ML IV SOLN
INTRAVENOUS | Status: DC | PRN
  Administered 2016-08-31: 20 mg via INTRAVENOUS
  Administered 2016-08-31: 40 mg via INTRAVENOUS
  Administered 2016-08-31: 20 mg via INTRAVENOUS
  Administered 2016-08-31 – 2016-09-01 (×3): 10 mg via INTRAVENOUS

## 2016-08-31 MED ORDER — FENTANYL CITRATE (PF) 100 MCG/2ML IJ SOLN
INTRAMUSCULAR | Status: AC
  Filled 2016-08-31: qty 2

## 2016-08-31 MED ORDER — DEXAMETHASONE SODIUM PHOSPHATE 10 MG/ML IJ SOLN
INTRAMUSCULAR | Status: DC | PRN
  Administered 2016-08-31: 10 mg via INTRAVENOUS

## 2016-08-31 MED ORDER — KCL IN DEXTROSE-NACL 20-5-0.45 MEQ/L-%-% IV SOLN
INTRAVENOUS | Status: DC
  Administered 2016-08-31: 125 mL via INTRAVENOUS
  Administered 2016-08-31 – 2016-09-02 (×5): via INTRAVENOUS
  Filled 2016-08-31 (×11): qty 1000

## 2016-08-31 MED ORDER — LIDOCAINE HCL (CARDIAC) 20 MG/ML IV SOLN
INTRAVENOUS | Status: DC | PRN
  Administered 2016-08-31: 60 mg via INTRAVENOUS

## 2016-08-31 MED ORDER — FENTANYL CITRATE (PF) 100 MCG/2ML IJ SOLN
INTRAMUSCULAR | Status: AC | PRN
  Administered 2016-08-31: 12.5 ug via INTRAVENOUS

## 2016-08-31 MED ORDER — PIPERACILLIN-TAZOBACTAM 3.375 G IVPB 30 MIN
3.3750 g | Freq: Once | INTRAVENOUS | Status: AC
  Administered 2016-08-31: 3.375 g via INTRAVENOUS
  Filled 2016-08-31: qty 50

## 2016-08-31 MED ORDER — SODIUM CHLORIDE 0.9 % IV SOLN
INTRAVENOUS | Status: DC | PRN
  Administered 2016-08-31: 50 ug/min via INTRAVENOUS

## 2016-08-31 MED ORDER — PROPOFOL 10 MG/ML IV BOLUS
INTRAVENOUS | Status: DC | PRN
  Administered 2016-08-31: 100 mg via INTRAVENOUS

## 2016-08-31 MED ORDER — MORPHINE SULFATE (PF) 4 MG/ML IV SOLN
2.0000 mg | Freq: Once | INTRAVENOUS | Status: DC
  Filled 2016-08-31: qty 1

## 2016-08-31 MED ORDER — MIDAZOLAM HCL 2 MG/2ML IJ SOLN
INTRAMUSCULAR | Status: AC
  Filled 2016-08-31: qty 4

## 2016-08-31 MED ORDER — FENTANYL CITRATE (PF) 100 MCG/2ML IJ SOLN: 50.0000 ug | Freq: Once | INTRAMUSCULAR | Status: DC

## 2016-08-31 MED ORDER — SUCCINYLCHOLINE CHLORIDE 20 MG/ML IJ SOLN
INTRAMUSCULAR | Status: DC | PRN
  Administered 2016-08-31: 100 mg via INTRAVENOUS

## 2016-08-31 MED ORDER — HYDROMORPHONE HCL 1 MG/ML IJ SOLN
0.5000 mg | INTRAMUSCULAR | Status: DC | PRN
  Administered 2016-08-31 – 2016-09-12 (×20): 0.5 mg via INTRAVENOUS
  Filled 2016-08-31 (×2): qty 1
  Filled 2016-08-31: qty 0.5
  Filled 2016-08-31 (×2): qty 1
  Filled 2016-08-31 (×2): qty 0.5
  Filled 2016-08-31 (×2): qty 1
  Filled 2016-08-31 (×2): qty 0.5
  Filled 2016-08-31: qty 1
  Filled 2016-08-31 (×3): qty 0.5
  Filled 2016-08-31: qty 1
  Filled 2016-08-31: qty 0.5
  Filled 2016-08-31 (×3): qty 1
  Filled 2016-08-31: qty 0.5

## 2016-08-31 MED ORDER — SODIUM CHLORIDE 0.9 % IV SOLN
INTRAVENOUS | Status: DC
  Administered 2016-08-31 (×3): via INTRAVENOUS

## 2016-08-31 MED ORDER — PIPERACILLIN-TAZOBACTAM 3.375 G IVPB 30 MIN
3.3750 g | Freq: Three times a day (TID) | INTRAVENOUS | Status: DC
  Filled 2016-08-31 (×2): qty 50

## 2016-08-31 SURGICAL SUPPLY — 78 items
APPLIER CLIP 11 MED OPEN (CLIP) ×4
BULB RESERV EVAC DRAIN JP 100C (MISCELLANEOUS) IMPLANT
CANISTER SUCT 1200ML W/VALVE (MISCELLANEOUS) ×8 IMPLANT
CATH FOL LX CONE TIP  8F (CATHETERS)
CATH FOL LX CONE TIP 8F (CATHETERS) IMPLANT
CATH TRAY 16F METER LATEX (MISCELLANEOUS) ×4 IMPLANT
CATH URETL OPEN END 4X70 (CATHETERS) ×4 IMPLANT
CATH URETL OPEN END 6FR 70 (CATHETERS) ×3 IMPLANT
CATH URETL OPEN END 6X70 (CATHETERS) IMPLANT
CHLORAPREP W/TINT 26ML (MISCELLANEOUS) ×4 IMPLANT
CLIP APPLIE 11 MED OPEN (CLIP) ×2 IMPLANT
CONRAY 43 FOR UROLOGY 50M (MISCELLANEOUS) ×12 IMPLANT
COVER CLAMP SIL LG PBX B (MISCELLANEOUS) IMPLANT
DRAIN CHANNEL JP 15F RND 16 (MISCELLANEOUS) ×4 IMPLANT
DRAPE LAPAROTOMY 100X77 ABD (DRAPES) ×4 IMPLANT
DRSG TELFA 3X8 NADH (GAUZE/BANDAGES/DRESSINGS) ×4 IMPLANT
ELECT BLADE 6 FLAT ULTRCLN (ELECTRODE) IMPLANT
ELECT BLADE 6.5 EXT (BLADE) ×4 IMPLANT
ELECT CAUTERY BLADE 6.4 (BLADE) ×4 IMPLANT
ELECT REM PT RETURN 9FT ADLT (ELECTROSURGICAL) ×4
ELECTRODE REM PT RTRN 9FT ADLT (ELECTROSURGICAL) ×2 IMPLANT
FEE TECHNICIAN ONLY PER HOUR (MISCELLANEOUS) IMPLANT
GAUZE SPONGE 4X4 12PLY STRL (GAUZE/BANDAGES/DRESSINGS) ×4 IMPLANT
GLOVE BIO SURGEON STRL SZ7 (GLOVE) ×16 IMPLANT
GLOVE BIO SURGEON STRL SZ7.5 (GLOVE) ×16 IMPLANT
GLOVE INDICATOR 8.0 STRL GRN (GLOVE) IMPLANT
GOWN STRL REUS W/ TWL LRG LVL3 (GOWN DISPOSABLE) IMPLANT
GOWN STRL REUS W/ TWL LRG LVL4 (GOWN DISPOSABLE) ×12 IMPLANT
GOWN STRL REUS W/TWL LRG LVL3 (GOWN DISPOSABLE)
GOWN STRL REUS W/TWL LRG LVL4 (GOWN DISPOSABLE) ×12
GOWN STRL REUS W/TWL XL LVL3 (GOWN DISPOSABLE) IMPLANT
GUIDEWIRE ANG ZIPWIRE 035X150 (WIRE) IMPLANT
HOLDER FOLEY CATH W/STRAP (MISCELLANEOUS) IMPLANT
INTRODUCER DILATOR DOUBLE (INTRODUCER) IMPLANT
JP DRAIN IMPLANT
KIT RM TURNOVER CYSTO AR (KITS) ×4 IMPLANT
KIT RM TURNOVER STRD PROC AR (KITS) IMPLANT
LABEL OR SOLS (LABEL) IMPLANT
LASER HOLMIUM FIBER SU 272UM (MISCELLANEOUS) IMPLANT
NEEDLE HYPO 22GX1.5 SAFETY (NEEDLE) ×4 IMPLANT
NS IRRIG 1000ML POUR BTL (IV SOLUTION) ×4 IMPLANT
PACK BASIN MAJOR ARMC (MISCELLANEOUS) ×4 IMPLANT
PACK CYSTO AR (MISCELLANEOUS) ×4 IMPLANT
PREP PVP WINGED SPONGE (MISCELLANEOUS) IMPLANT
SENSORWIRE 0.038 NOT ANGLED (WIRE) ×4
SET CYSTO W/LG BORE CLAMP LF (SET/KITS/TRAYS/PACK) ×4 IMPLANT
SET YANKAUER POOLE SUCT (MISCELLANEOUS) IMPLANT
SILICONE EVACUATOR ×4 IMPLANT
SOL .9 NS 3000ML IRR  AL (IV SOLUTION) ×2
SOL .9 NS 3000ML IRR UROMATIC (IV SOLUTION) ×2 IMPLANT
SOL PREP PVP 2OZ (MISCELLANEOUS) ×4
SOLUTION PREP PVP 2OZ (MISCELLANEOUS) ×2 IMPLANT
SPONGE LAP 18X18 5 PK (GAUZE/BANDAGES/DRESSINGS) ×8 IMPLANT
STAPLER SKIN PROX 35W (STAPLE) ×4 IMPLANT
STENT URET 6FRX24 CONTOUR (STENTS) ×4 IMPLANT
STENT URET 6FRX26 CONTOUR (STENTS) IMPLANT
SURGILUBE 2OZ TUBE FLIPTOP (MISCELLANEOUS) ×4 IMPLANT
SUT ETH BLK MONO 3 0 FS 1 12/B (SUTURE) IMPLANT
SUT ETHILON 3-0 FS-10 30 BLK (SUTURE) ×4
SUT PDS AB 0 CT1 27 (SUTURE) ×8 IMPLANT
SUT PROLENE 0 CT 1 30 (SUTURE) IMPLANT
SUT SILK 2 0 (SUTURE) ×2
SUT SILK 2 0SH CR/8 30 (SUTURE) ×8 IMPLANT
SUT SILK 2-0 18XBRD TIE 12 (SUTURE) ×2 IMPLANT
SUT SILK 3-0 (SUTURE) IMPLANT
SUT VIC AB 2-0 BRD 54 (SUTURE) IMPLANT
SUT VIC AB 3-0 SH 27 (SUTURE) ×2
SUT VIC AB 3-0 SH 27X BRD (SUTURE) ×2 IMPLANT
SUT VIC AB 4-0 SH 27 (SUTURE) ×6
SUT VIC AB 4-0 SH 27XANBCTRL (SUTURE) ×6 IMPLANT
SUT VICRYL 3-0 SH-1 18IN (SUTURE) IMPLANT
SUTURE EHLN 3-0 FS-10 30 BLK (SUTURE) ×2 IMPLANT
SYR 10ML LL (SYRINGE) ×3 IMPLANT
SYR 20CC LL (SYRINGE) ×12 IMPLANT
SYRINGE IRR TOOMEY STRL 70CC (SYRINGE) ×4 IMPLANT
TUBING CONNECTING 10 (TUBING) ×3 IMPLANT
TUBING CONNECTING 10' (TUBING) ×1
WIRE SENSOR 0.038 NOT ANGLED (WIRE) ×2 IMPLANT

## 2016-08-31 NOTE — ED Notes (Signed)
In room to check on patient.  Found to have O2 sat of 88-89% on 5L [place].  Requesting pain medication.  Questioning how long it will take to transfer patient.  Resident made aware of issues.  Asked him to please see patient.  Awaiting orders.

## 2016-08-31 NOTE — OR Nursing (Signed)
Dr Anselm Pancoast notified of coags, ok to proceed

## 2016-08-31 NOTE — Progress Notes (Signed)
Placed patient on Bipap due to increased work of breathing and decreased SpO2 (77% per MD) Rt will continue to monitor.

## 2016-08-31 NOTE — H&P (Signed)
Chief Complaint: Patient was seen in consultation today for  Chief Complaint  Patient presents with  . Post-op Problem    Transfer from [facility]   at the request of Dr. Burt Knack  Referring Physician(s): Phoebe Perch, MD  Patient Status: [facility] - In-pt  History of Present Illness: Katherine Medina is a 82 y.o. female with recent history of right colon resection for cancer and concomitant hysterectomy. Patient presented to the emergency department with severe abdominal pain. Initially, there was concern the patient was having a pulmonary embolism and was initially sent to [facility]. CT of the chest, abdomen and pelvis was negative for pulmonary embolism but demonstrated a large fluid collection in the pelvis concerning for an abscess. Patient was transferred to [facility] and has been admitted to the intensive care unit. Patient is feeling better but continues to have intermittent sharp abdominal pain. Patient is not complaining of shortness of breath but she is on supplemental oxygen with nasal cannula.  Past Medical History:  Diagnosis Date  . Cancer ([place])   . Complication of anesthesia    BP dropped, Dr. Staci Acosta had to stop the procedure.  . Hypotension   . Medical history non-contributory     Past Surgical History:  Procedure Laterality Date  . [place]  . COLON RESECTION N/A 08/20/2016   Procedure: HAND ASSISTED LAPAROSCOPIC COLON RESECTION/ HEMI COLECTOMY;  Surgeon: Jules Husbands, MD;  Location: [facility] ORS;  Service: General;  Laterality: N/A;  . COLONOSCOPY    . COLONOSCOPY WITH PROPOFOL N/A 06/30/2016   Procedure: COLONOSCOPY WITH PROPOFOL;  Surgeon: Lollie Sails, MD;  Location: [facility] ENDOSCOPY;  Service: Endoscopy;  Laterality: N/A;  . EYE SURGERY     cataract with IOL Bilateral  . HYSTEROSCOPY W/D&C N/A 07/10/2016   Procedure: DILATATION AND CURETTAGE /HYSTEROSCOPY WITH ENDOMETRIAL BIOPSY;  Surgeon: Honor Loh Ward, MD;  Location: [facility] ORS;   Service: Gynecology;  Laterality: N/A;  . LAPAROSCOPIC APPENDECTOMY N/A 04/01/2016   Procedure: APPENDECTOMY LAPAROSCOPIC;  Surgeon: Jules Husbands, MD;  Location: [facility] ORS;  Service: General;  Laterality: N/A;  . LAPAROSCOPIC HYSTERECTOMY N/A 08/20/2016   Procedure: HYSTERECTOMY TOTAL LAPAROSCOPIC WITH BSO;  Surgeon: Honor Loh Ward, MD;  Location: [facility] ORS;  Service: Gynecology;  Laterality: N/A;  . PARTIAL COLECTOMY N/A 08/20/2016   Procedure: PARTIAL COLECTOMY;  Surgeon: Jules Husbands, MD;  Location: [facility] ORS;  Service: General;  Laterality: N/A;    Allergies: Patient has no known allergies.  Medications: Prior to Admission medications   Medication Sig Start Date End Date Taking? Authorizing Provider  acetaminophen (TYLENOL) 500 MG tablet Take 500-1,000 mg by mouth every 6 (six) hours as needed (for pain).   Yes Historical Provider, MD  Calcium Carbonate-Vitamin D (CALTRATE 600+D PO) Take 1 tablet by mouth daily.   Yes Historical Provider, MD  HYDROcodone-acetaminophen (NORCO/VICODIN) 5-325 MG tablet Take 1-2 tablets by mouth every 6 (six) hours as needed for moderate pain. 08/28/16  Yes Diego Sarita Haver, MD     Family History  Problem Relation Age of Onset  . Diabetes Mother   . Hypertension Father   . Colon cancer Neg Hx     Social History   Social History  . Marital status: Married    Spouse name: N/A  . Number of children: N/A  . Years of education: N/A   Social History Main Topics  . Smoking status: Never Smoker  . Smokeless tobacco: Never Used  .  Alcohol use No  . Drug use: No  . Sexual activity: No   Other Topics Concern  . None   Social History Narrative  . None     Review of Systems  Respiratory: Positive for shortness of breath.   Gastrointestinal: Positive for abdominal distention and abdominal pain.    Vital Signs: BP (!) 93/41   Pulse 91   Temp 97.7 F (36.5 C)   Resp 18   Ht 5\' 4"  (1.626 m)   Wt 164 lb 7.4 oz (74.6 kg)   SpO2 96%   BMI 28.23  kg/m   Physical Exam  Cardiovascular: Normal rate, regular rhythm and normal heart sounds.   Pulmonary/Chest: Effort normal.  Decreased breath sounds bilaterally.  Abdominal: There is tenderness. There is guarding.  Lap. surgery sites noted.  Tenderness throughout abdomen, particularly in RLQ.  Skin and soft tissue fullness in RLQ.    Mallampati Score:  MD Evaluation Airway: WNL Heart: WNL Abdomen: Other (comments) Abdomen comments: Post operative changes and tenderness, particularly on right side Chest/ Lungs: Other (comments) Chest/ lungs comments: Decreased breath sounds bilaterally ASA  Classification: 2 Mallampati/Airway Score: Two  Imaging: Ct Angio Chest Pe W And/or Wo Contrast  Result Date: 08/30/2016 CLINICAL DATA:  82 year old female with shortness of breath and tachycardia. Hypoxia. Recent surgery. History of colon cancer and recent colonoscopy. EXAM: CT ANGIOGRAPHY CHEST CT ABDOMEN AND PELVIS WITH CONTRAST TECHNIQUE: Multidetector CT imaging of the chest was performed using the standard protocol during bolus administration of intravenous contrast. Multiplanar CT image reconstructions and MIPs were obtained to evaluate the vascular anatomy. Multidetector CT imaging of the abdomen and pelvis was performed using the standard protocol during bolus administration of intravenous contrast. CONTRAST:  75 cc Isovue 370 COMPARISON:  Chest radiograph dated 08/20/2016 abdominal radiograph dated 08/24/2016. Comparison is also made CT CT of the abdomen pelvis dated 07/31/2016 FINDINGS: CTA CHEST FINDINGS Cardiovascular: There is no cardiomegaly or pericardial effusion. There is slight displacement of the heart anteriorly and to the left hemithorax secondary to mass effect caused by the large hiatal hernia. There is atherosclerotic calcification of the thoracic aorta and aortic arch. No aneurysmal dilatation or evidence of dissection. The origins of the great vessels of the aortic arch appear  patent. No CT evidence of pulmonary embolism. Mediastinum/Nodes: There is no hilar or mediastinal adenopathy. There is free large hiatal hernia containing the majority of the stomach with organo-axial gastric volvulus. There is air distention of the herniated portion of the stomach without definite evidence of gastric outlet obstruction. The esophagus is grossly unremarkable. The thyroid gland is atrophic. No thyroid nodules identified. Lungs/Pleura: There are small bilateral pleural effusions with associated partial compressive consolidation of the lower lobes, new since the CT of 07/31/2016. Pneumonia is not excluded. Clinical correlation and follow-up to resolution is recommended. There is no pneumothorax. The central airways are patent. Musculoskeletal: There is no axillary adenopathy. Mild diffuse subcutaneous edema. The chest wall soft tissues are otherwise unremarkable. The there is osteopenia with degenerative changes of the spine. No acute fracture. Review of the MIP images confirms the above findings. CT ABDOMEN and PELVIS FINDINGS No intra-abdominal free air. Small ascites. There is a 12 x 12 cm fluid collection within the pelvis which appears loculated with enhancing walls concerning for an infected collection and developing abscess. Hepatobiliary: A 1.6 x 1.9 cm focal nodular enhancement in the inferior aspect of the right lobe of the liver (series 501 image 34 is incompletely characterized but most  likely represents a portal venous shunting versus a hemangioma. Other etiologies are not excluded. This appears similar to prior studies. There is no intrahepatic biliary ductal dilatation. The gallbladder is mildly distended. There is small amount of layering sludge in the gallbladder fundus. No pericholecystic fluid. Pancreas: A 1 cm low attenuating/cystic listen arising from the anterior aspect of the head of the pancreas is incompletely characterized but may represent a side branch IPMN or retention  cyst. Other etiologies are not excluded. MRI may provide better characterisation. There is no dilatation of the main pancreatic duct or associated gland atrophy. Spleen: Normal in size without focal abnormality. Adrenals/Urinary Tract: The adrenal glands, kidneys, visualized ureters appear unremarkable. The urinary bladder is decompressed around a Foley catheter. Stomach/Bowel: There is postsurgical changes of right hemicolectomy with ileotransverse anastomosis in the mid abdomen. Multiple mildly dilated and thickened loops of small bowel throughout the abdomen most compatible with a degree of ileus. Superimposed enteritis is not excluded. Clinical correlation is recommended. There is abutment of bowel loops to the anterior peritoneal wall compatible with adhesions. There is extensive sigmoid diverticulosis with muscular hypertrophy. No active inflammatory changes. Evaluation of the bowel is limited in the absence of oral contrast. Vascular/Lymphatic: There is moderate aortoiliac atherosclerotic disease. The origins of the celiac axis, SMA, IMA as well as the origins of the renal arteries are patent. The SMV, splenic vein, and main portal vein are patent. No portal venous gas identified. There is no adenopathy. Reproductive: Hysterectomy. Other: There is diffuse mesenteric edema as well as diffuse subcutaneous edema and anasarca. Musculoskeletal: Multilevel degenerative changes of spine. Osteopenia. No acute fracture. IMPRESSION: Interval development of small bilateral pleural effusions with associated partial consolidative changes of the lung bases which may represent atelectasis versus infiltrate. Clinical correlation and follow-up to resolution recommended. No CT evidence of aortic dissection or pulmonary embolism. Large hiatal hernia containing the majority of the stomach. Postsurgical changes of right hemicolectomy with ileotransverse anastomosis. Mildly distended thickened loops of small bowel most compatible  with an ileus. Enteritis is not excluded. Clinical correlation is recommended. No evidence of bowel obstruction. Sigmoid diverticulosis. Small ascites, diffuse mesenteric edema and anasarca. Loculated appearing fluid within the pelvis with enhancing wall concerning for an infected collection or developing abscess. Clinical correlation is recommended. Electronically Signed   By: Anner Crete M.D.   On: 08/30/2016 22:02   Ct Abdomen Pelvis W Contrast  Result Date: 08/30/2016 CLINICAL DATA:  82 year old female with shortness of breath and tachycardia. Hypoxia. Recent surgery. History of colon cancer and recent colonoscopy. EXAM: CT ANGIOGRAPHY CHEST CT ABDOMEN AND PELVIS WITH CONTRAST TECHNIQUE: Multidetector CT imaging of the chest was performed using the standard protocol during bolus administration of intravenous contrast. Multiplanar CT image reconstructions and MIPs were obtained to evaluate the vascular anatomy. Multidetector CT imaging of the abdomen and pelvis was performed using the standard protocol during bolus administration of intravenous contrast. CONTRAST:  75 cc Isovue 370 COMPARISON:  Chest radiograph dated 08/20/2016 abdominal radiograph dated 08/24/2016. Comparison is also made CT CT of the abdomen pelvis dated 07/31/2016 FINDINGS: CTA CHEST FINDINGS Cardiovascular: There is no cardiomegaly or pericardial effusion. There is slight displacement of the heart anteriorly and to the left hemithorax secondary to mass effect caused by the large hiatal hernia. There is atherosclerotic calcification of the thoracic aorta and aortic arch. No aneurysmal dilatation or evidence of dissection. The origins of the great vessels of the aortic arch appear patent. No CT evidence of pulmonary embolism. Mediastinum/Nodes: There  is no hilar or mediastinal adenopathy. There is free large hiatal hernia containing the majority of the stomach with organo-axial gastric volvulus. There is air distention of the  herniated portion of the stomach without definite evidence of gastric outlet obstruction. The esophagus is grossly unremarkable. The thyroid gland is atrophic. No thyroid nodules identified. Lungs/Pleura: There are small bilateral pleural effusions with associated partial compressive consolidation of the lower lobes, new since the CT of 07/31/2016. Pneumonia is not excluded. Clinical correlation and follow-up to resolution is recommended. There is no pneumothorax. The central airways are patent. Musculoskeletal: There is no axillary adenopathy. Mild diffuse subcutaneous edema. The chest wall soft tissues are otherwise unremarkable. The there is osteopenia with degenerative changes of the spine. No acute fracture. Review of the MIP images confirms the above findings. CT ABDOMEN and PELVIS FINDINGS No intra-abdominal free air. Small ascites. There is a 12 x 12 cm fluid collection within the pelvis which appears loculated with enhancing walls concerning for an infected collection and developing abscess. Hepatobiliary: A 1.6 x 1.9 cm focal nodular enhancement in the inferior aspect of the right lobe of the liver (series 501 image 34 is incompletely characterized but most likely represents a portal venous shunting versus a hemangioma. Other etiologies are not excluded. This appears similar to prior studies. There is no intrahepatic biliary ductal dilatation. The gallbladder is mildly distended. There is small amount of layering sludge in the gallbladder fundus. No pericholecystic fluid. Pancreas: A 1 cm low attenuating/cystic listen arising from the anterior aspect of the head of the pancreas is incompletely characterized but may represent a side branch IPMN or retention cyst. Other etiologies are not excluded. MRI may provide better characterisation. There is no dilatation of the main pancreatic duct or associated gland atrophy. Spleen: Normal in size without focal abnormality. Adrenals/Urinary Tract: The adrenal  glands, kidneys, visualized ureters appear unremarkable. The urinary bladder is decompressed around a Foley catheter. Stomach/Bowel: There is postsurgical changes of right hemicolectomy with ileotransverse anastomosis in the mid abdomen. Multiple mildly dilated and thickened loops of small bowel throughout the abdomen most compatible with a degree of ileus. Superimposed enteritis is not excluded. Clinical correlation is recommended. There is abutment of bowel loops to the anterior peritoneal wall compatible with adhesions. There is extensive sigmoid diverticulosis with muscular hypertrophy. No active inflammatory changes. Evaluation of the bowel is limited in the absence of oral contrast. Vascular/Lymphatic: There is moderate aortoiliac atherosclerotic disease. The origins of the celiac axis, SMA, IMA as well as the origins of the renal arteries are patent. The SMV, splenic vein, and main portal vein are patent. No portal venous gas identified. There is no adenopathy. Reproductive: Hysterectomy. Other: There is diffuse mesenteric edema as well as diffuse subcutaneous edema and anasarca. Musculoskeletal: Multilevel degenerative changes of spine. Osteopenia. No acute fracture. IMPRESSION: Interval development of small bilateral pleural effusions with associated partial consolidative changes of the lung bases which may represent atelectasis versus infiltrate. Clinical correlation and follow-up to resolution recommended. No CT evidence of aortic dissection or pulmonary embolism. Large hiatal hernia containing the majority of the stomach. Postsurgical changes of right hemicolectomy with ileotransverse anastomosis. Mildly distended thickened loops of small bowel most compatible with an ileus. Enteritis is not excluded. Clinical correlation is recommended. No evidence of bowel obstruction. Sigmoid diverticulosis. Small ascites, diffuse mesenteric edema and anasarca. Loculated appearing fluid within the pelvis with  enhancing wall concerning for an infected collection or developing abscess. Clinical correlation is recommended. Electronically Signed   By:  Anner Crete M.D.   On: 08/30/2016 22:02   Dg Abd Acute W/chest  Result Date: 08/23/2016 CLINICAL DATA:  Partial colectomy August 20, 2016. Gaseous distension. Follow-up. EXAM: DG ABDOMEN ACUTE W/ 1V CHEST COMPARISON:  August 22, 2016 FINDINGS: Free air is consistent with recent surgery. The NG tube terminates in the stomach, pulled back somewhat in the interval. Both the distal tip and sideport are in the stomach. Bibasilar opacities are likely atelectasis, similar in the interval. Stable cardiomegaly and hiatal hernia. No other changes in the chest. Air-filled loops of large and small bowel are similar in the interval, likely ileus. Air in the subcutaneous tissues may be from recent surgery. There is gas to the level the rectum on this study. IMPRESSION: 1. Appropriate placement of NG tube. Postoperative free air. Continued ileus. 2. Effusions and underlying atelectasis in the lung bases. Electronically Signed   By: Dorise Bullion III M.D   On: 08/23/2016 07:48   Dg Abd Acute W/chest  Result Date: 08/22/2016 CLINICAL DATA:  Pt had a laparoscopic hemicolectomy from colon cancer on 08-20-16. Pt is feeling nauseous and is vomiting now. EXAM: DG ABDOMEN ACUTE W/ 1V CHEST COMPARISON:  07/31/2016, 07/24/2016 FINDINGS: Shallow lung inflation. Heart size is normal. There is bibasilar atelectasis. More focal opacity at the left lung base may indicate infectious infiltrate. Left pleural effusion is noted. There is gaseous distension of numerous bowel loops throughout the abdomen and pelvis. Moderate free intraperitoneal air. Moderate subcutaneous gas along the abdominal wall. Moderate degenerative changes in the lumbar spine, SI joints. IMPRESSION: 1. Moderate free intraperitoneal air and subcutaneous gas following recent surgery. 2. Significant gas is distension of  bowel loops throughout the abdomen and pelvis, favoring ileus. 3. Left lower lobe infiltrate and left pleural effusion. Electronically Signed   By: Nolon Nations M.D.   On: 08/22/2016 17:16   Dg Abd Portable 1v  Result Date: 08/22/2016 CLINICAL DATA:  NG tube placement. EXAM: PORTABLE ABDOMEN - 1 VIEW COMPARISON:  Radiographs earlier this day FINDINGS: Tip and side port of the enteric tube below the diaphragm likely in the stomach. There is a large hiatal hernia. Free intra-abdominal air consistent with recent surgery, unchanged. Gaseous distention of bowel loops in the upper abdomen again seen. Subcutaneous gas in the right abdominal wall. There is atherosclerosis of the thoracic aorta. There is blunting of the costophrenic angle with small effusion and linear opacities. IMPRESSION: 1. Tip and side port of the enteric tube below the diaphragm, likely in the stomach. There is a large hiatal hernia. 2. Unchanged gaseous distention of bowel loops in the abdomen, suspect ileus. Free air post recent intra-abdominal surgery. 3. Small left pleural effusion and basilar opacity, unchanged. Electronically Signed   By: Jeb Levering M.D.   On: 08/22/2016 18:30   Dg Abd Portable 2v  Result Date: 08/24/2016 CLINICAL DATA:  Ileus EXAM: PORTABLE ABDOMEN - 2 VIEW COMPARISON:  August 23, 2016 FINDINGS: Supine and upright images obtained. The distal nasogastric tube appears coiled in a hiatal hernia ; tip is tube is within an apparent hiatal hernia. The previously noted pneumoperitoneum is much less appreciable on this current examination consistent with resolving postoperative pneumoperitoneum. There is persistent bowel dilatation, likely due to ileus. Areas noted in both small and large bowel. IMPRESSION: Apparent resolving postoperative pneumoperitoneum. Suspect persistent ileus with persistent bowel dilatation. No appreciable air-fluid levels noted. Nasogastric to tip appears to reside within a rather sizable  hiatal hernia. Electronically Signed   By: Gwyndolyn Saxon  Jasmine December III M.D.   On: 08/24/2016 07:15    Labs:  CBC:  Recent Labs  08/24/16 0617 08/27/16 0448 08/30/16 1923 08/30/2016 0453  WBC 11.5* 10.1 16.7* 45.3*  HGB 11.0* 10.8* 12.8 11.3*  HCT 33.1* 31.8* 38.0 34.0*  PLT 171 250 488* 387    COAGS: No results for input(s): INR, APTT in the last 8760 hours.  BMP:  Recent Labs  08/27/16 0448 08/28/16 0428 08/30/16 1923 08/22/2016 0453  NA 140 141 131* 139  K 3.4* 3.7 4.5 3.9  CL 106 106 98* 109  CO2 28 31 21* 23  GLUCOSE 85 89 135* 114*  BUN 20 19 16 19   CALCIUM 7.9* 7.8* 7.9* 7.2*  CREATININE 1.06* 1.24* 1.42* 1.31*  GFRNONAA 47* 38* 33* 36*  GFRAA 54* 45* 38* 42*    LIVER FUNCTION TESTS:  Recent Labs  03/31/16 2117 07/24/16 1201 08/30/16 1923  BILITOT 0.9 0.4 1.2  AST 23 26 29   ALT 13* 14 14  ALKPHOS 56 63 75  PROT 7.7 8.1 6.2*  ALBUMIN 3.9 4.2 2.4*    TUMOR MARKERS:  Recent Labs  07/24/16 1201  CEA 0.2    Assessment and Plan:  82 year old female with recent right colon resection and hysterectomy. CT imaging demonstrates a large fluid collection in the pelvis and this could represent a abscess particularly based on the markedly elevated white blood cell count. Patient is currently resting comfortably in the intensive care unit but her blood pressure is low. Discussed a CT-guided drain placement in the pelvic fluid collection with the patient and her family. Explained the risks which include bleeding, infection and complications from the moderate sedation including respiratory failure and hypotension.  I reviewed the patient's imaging and feel the patient is a candidate for CT-guided drainage from a transgluteal approach. My biggest concern at this point is the patient's breathing and hypotension. CT also demonstrated bilateral pleural effusions. Explained to the patient that we would likely only use light sedation based on her blood pressure. In addition,  patient says that she has a history of hypotension with sedation with a colonoscopy. More than likely, we will have to perform this procedure with basically just local anesthetic. Patient is aware of these findings and is agreeable to the procedure. Plan for CT-guided pelvic drain placement later today.  Thank you for this interesting consult.  I greatly enjoyed meeting Katherine Medina and look forward to participating in their care.  A copy of this report was sent to the requesting provider on this date.  Electronically Signed: Carylon Perches 09/12/2016, 10:27 AM   I spent a total of 20 Minutes    in face to face in clinical consultation, greater than 50% of which was counseling/coordinating care for CT-guided drain placement.

## 2016-08-31 NOTE — Progress Notes (Signed)
Patient transported couple of hours ago from [facility] ED to [facility].  Patient requiring bipap due to increased wob and low sats.  Patient was placed on bipap 10/5 100% rr8.  Patient was transferred to icu on those settings. abg was obtained.  Patient tolerated respiratory services well.

## 2016-08-31 NOTE — ED Notes (Signed)
Bp continues to be low.  Resident aware.  Told to hold off on morphine for now.  Primary nurse updated on patient condition.

## 2016-08-31 NOTE — Progress Notes (Signed)
Pt arrive to ICU from ED accompanied by daughter. Pt CO stabbing intermittent abdominal pain rates 3 of 10. One midline surgical incision and a two surgical puncture incisions on lower right and left abdomen. All areas are free of erethyma/swelling. Bipap FIo2 100%. Pt swallows pills with small sips of water safely.

## 2016-08-31 NOTE — H&P (Signed)
Katherine Medina is an 82 y.o. female.    Chief Complaint: Abdominal pain  HPI: This patient was possibly a week out from a right colon resection for colon cancer and concomitant hysterectomy and nephrectomy. She was in her improving state of health at home but noticed worsening abdominal pain at first not controlled by Tylenol then not controlled by Vicodin either therefore 911 was called. Because they thought she was having a pulmonary embolus they sent her to [place] scan performed there demonstrated a pelvic abscess.  I was contacted by Dr. Molli Posey the surgeon on call who was in the emergency room seeing this patient. He felt that she would be best served by being seen by her operative surgeon Dr. Dahlia Byes. I accepted the patient in transfer. Several hours one by and the patient had not yet been transferred and ultimately we determined that she had become unstable at [facility] but responded to resuscitation with elevation in her blood pressure and improvement in her heart rate she was placed on BiPAP however. She was transferred in stable condition to the ED here at [facility].  Patient was met in the emergency room where she was found to be on BiPAP but fairly comfortable she was accompanied by her daughter. Her daughter stated that she felt to be doing better than she was earlier today.  I have spoken to Dr. Ula Lingo and the nursing supervisor concerning ICU placement.  Review of systems cannot be obtained due to the patient's acuity and BiPAP  Past Medical History:  Diagnosis Date  . Cancer ([place])   . Complication of anesthesia    BP dropped, Dr. Staci Acosta had to stop the procedure.  . Hypotension   . Medical history non-contributory     Past Surgical History:  Procedure Laterality Date  . [place]  . COLON RESECTION N/A 08/20/2016   Procedure: HAND ASSISTED LAPAROSCOPIC COLON RESECTION/ HEMI COLECTOMY;  Surgeon: Jules Husbands, MD;  Location: [facility] ORS;   Service: General;  Laterality: N/A;  . COLONOSCOPY    . COLONOSCOPY WITH PROPOFOL N/A 06/30/2016   Procedure: COLONOSCOPY WITH PROPOFOL;  Surgeon: Lollie Sails, MD;  Location: [facility] ENDOSCOPY;  Service: Endoscopy;  Laterality: N/A;  . EYE SURGERY     cataract with IOL Bilateral  . HYSTEROSCOPY W/D&C N/A 07/10/2016   Procedure: DILATATION AND CURETTAGE /HYSTEROSCOPY WITH ENDOMETRIAL BIOPSY;  Surgeon: Honor Loh Ward, MD;  Location: [facility] ORS;  Service: Gynecology;  Laterality: N/A;  . LAPAROSCOPIC APPENDECTOMY N/A 04/01/2016   Procedure: APPENDECTOMY LAPAROSCOPIC;  Surgeon: Jules Husbands, MD;  Location: [facility] ORS;  Service: General;  Laterality: N/A;  . LAPAROSCOPIC HYSTERECTOMY N/A 08/20/2016   Procedure: HYSTERECTOMY TOTAL LAPAROSCOPIC WITH BSO;  Surgeon: Honor Loh Ward, MD;  Location: [facility] ORS;  Service: Gynecology;  Laterality: N/A;  . PARTIAL COLECTOMY N/A 08/20/2016   Procedure: PARTIAL COLECTOMY;  Surgeon: Jules Husbands, MD;  Location: [facility] ORS;  Service: General;  Laterality: N/A;    Family History  Problem Relation Age of Onset  . Diabetes Mother   . Hypertension Father   . Colon cancer Neg Hx    Social History:  reports that she has never smoked. She has never used smokeless tobacco. She reports that she does not drink alcohol or use drugs.  Allergies: No Known Allergies   (Not in a hospital admission)   Review of Systems  Unable to perform ROS: Acuity of condition     Physical Exam:  BP (!) 119/52 (BP Location: Right Arm)   Pulse (!) 101   Resp 20   Ht 5' 2"  (1.575 m)   Wt 136 lb (61.7 kg)   SpO2 92%   BMI 24.87 kg/m   Physical Exam  Constitutional: She is oriented to person, place, and time. She appears distressed.  Appears uncomfortable on BiPAP  HENT:  Head: Normocephalic and atraumatic.  Eyes: Pupils are equal, round, and reactive to light. Right eye exhibits no discharge. Left eye exhibits no discharge. No scleral icterus.  Neck: Normal range of  motion.  Cardiovascular: Regular rhythm and normal heart sounds.   Tachycardic  Pulmonary/Chest: Breath sounds normal. She is in respiratory distress. She has no wheezes. She has no rales.  Bilateral rhonchi with increased respiratory rate and BiPAP  Abdominal: She exhibits distension. There is tenderness. There is no rebound and no guarding.  Diffuse tenderness mostly in the lower quadrants with minimal if any guarding no rebound or percussion tenderness wounds are clean with Dermabond in place no erythema or drainage  Musculoskeletal: Normal range of motion. She exhibits edema. She exhibits no tenderness.  Lymphadenopathy:    She has no cervical adenopathy.  Neurological: She is alert and oriented to person, place, and time.  Skin: Skin is warm and dry. No rash noted. She is not diaphoretic. No erythema.  Vitals reviewed.       Results for orders placed or performed during the hospital encounter of 08/30/16 (from the past 48 hour(s))  CBC with Differential     Status: Abnormal   Collection Time: 08/30/16  7:23 PM  Result Value Ref Range   WBC 16.7 (H) 4.0 - 10.5 K/uL   RBC 4.61 3.87 - 5.11 MIL/uL   Hemoglobin 12.8 12.0 - 15.0 g/dL   HCT 38.0 36.0 - 46.0 %   MCV 82.4 78.0 - 100.0 fL   MCH 27.8 26.0 - 34.0 pg   MCHC 33.7 30.0 - 36.0 g/dL   RDW 14.9 11.5 - 15.5 %   Platelets 488 (H) 150 - 400 K/uL   Neutrophils Relative % 91 %   Neutro Abs 15.2 (H) 1.7 - 7.7 K/uL   Lymphocytes Relative 7 %   Lymphs Abs 1.1 0.7 - 4.0 K/uL   Monocytes Relative 1 %   Monocytes Absolute 0.2 0.1 - 1.0 K/uL   Eosinophils Relative 1 %   Eosinophils Absolute 0.1 0.0 - 0.7 K/uL   Basophils Relative 0 %   Basophils Absolute 0.1 0.0 - 0.1 K/uL  Comprehensive metabolic panel     Status: Abnormal   Collection Time: 08/30/16  7:23 PM  Result Value Ref Range   Sodium 131 (L) 135 - 145 mmol/L   Potassium 4.5 3.5 - 5.1 mmol/L   Chloride 98 (L) 101 - 111 mmol/L   CO2 21 (L) 22 - 32 mmol/L   Glucose, Bld  135 (H) 65 - 99 mg/dL   BUN 16 6 - 20 mg/dL   Creatinine, Ser 1.42 (H) 0.44 - 1.00 mg/dL   Calcium 7.9 (L) 8.9 - 10.3 mg/dL   Total Protein 6.2 (L) 6.5 - 8.1 g/dL   Albumin 2.4 (L) 3.5 - 5.0 g/dL   AST 29 15 - 41 U/L   ALT 14 14 - 54 U/L   Alkaline Phosphatase 75 38 - 126 U/L   Total Bilirubin 1.2 0.3 - 1.2 mg/dL   GFR calc non Af Amer 33 (L) >60 mL/min   GFR calc Af Amer 38 (L) >60  mL/min    Comment: (NOTE) The eGFR has been calculated using the CKD EPI equation. This calculation has not been validated in all clinical situations. eGFR's persistently <60 mL/min signify possible Chronic Kidney Disease.    Anion gap 12 5 - 15  Brain natriuretic peptide     Status: None   Collection Time: 08/30/16  7:23 PM  Result Value Ref Range   B Natriuretic Peptide 56.5 0.0 - 100.0 pg/mL  I-Stat CG4 Lactic Acid, ED     Status: Abnormal   Collection Time: 08/30/16  7:33 PM  Result Value Ref Range   Lactic Acid, Venous 3.64 (HH) 0.5 - 1.9 mmol/L   Comment NOTIFIED PHYSICIAN   I-Stat CG4 Lactic Acid, ED     Status: Abnormal   Collection Time: 08/30/16 11:35 PM  Result Value Ref Range   Lactic Acid, Venous 3.43 (HH) 0.5 - 1.9 mmol/L   Comment NOTIFIED PHYSICIAN   I-Stat arterial blood gas, ED     Status: Abnormal   Collection Time: 08/30/2016  1:31 AM  Result Value Ref Range   pH, Arterial 7.417 7.350 - 7.450   pCO2 arterial 31.6 (L) 32.0 - 48.0 mmHg   pO2, Arterial 73.0 (L) 83.0 - 108.0 mmHg   Bicarbonate 20.4 20.0 - 28.0 mmol/L   TCO2 21 0 - 100 mmol/L   O2 Saturation 95.0 %   Acid-base deficit 3.0 (H) 0.0 - 2.0 mmol/L   Patient temperature HIDE    Collection site BRACHIAL ARTERY    Drawn by RT    Sample type ARTERIAL   I-Stat CG4 Lactic Acid, ED     Status: Abnormal   Collection Time: 09/04/2016  2:12 AM  Result Value Ref Range   Lactic Acid, Venous 2.81 (HH) 0.5 - 1.9 mmol/L   Comment NOTIFIED PHYSICIAN    Ct Angio Chest Pe W And/or Wo Contrast  Result Date: 08/30/2016 CLINICAL  DATA:  82 year old female with shortness of breath and tachycardia. Hypoxia. Recent surgery. History of colon cancer and recent colonoscopy. EXAM: CT ANGIOGRAPHY CHEST CT ABDOMEN AND PELVIS WITH CONTRAST TECHNIQUE: Multidetector CT imaging of the chest was performed using the standard protocol during bolus administration of intravenous contrast. Multiplanar CT image reconstructions and MIPs were obtained to evaluate the vascular anatomy. Multidetector CT imaging of the abdomen and pelvis was performed using the standard protocol during bolus administration of intravenous contrast. CONTRAST:  75 cc Isovue 370 COMPARISON:  Chest radiograph dated 08/20/2016 abdominal radiograph dated 08/24/2016. Comparison is also made CT CT of the abdomen pelvis dated 07/31/2016 FINDINGS: CTA CHEST FINDINGS Cardiovascular: There is no cardiomegaly or pericardial effusion. There is slight displacement of the heart anteriorly and to the left hemithorax secondary to mass effect caused by the large hiatal hernia. There is atherosclerotic calcification of the thoracic aorta and aortic arch. No aneurysmal dilatation or evidence of dissection. The origins of the great vessels of the aortic arch appear patent. No CT evidence of pulmonary embolism. Mediastinum/Nodes: There is no hilar or mediastinal adenopathy. There is free large hiatal hernia containing the majority of the stomach with organo-axial gastric volvulus. There is air distention of the herniated portion of the stomach without definite evidence of gastric outlet obstruction. The esophagus is grossly unremarkable. The thyroid gland is atrophic. No thyroid nodules identified. Lungs/Pleura: There are small bilateral pleural effusions with associated partial compressive consolidation of the lower lobes, new since the CT of 07/31/2016. Pneumonia is not excluded. Clinical correlation and follow-up to resolution is recommended. There  is no pneumothorax. The central airways are patent.  Musculoskeletal: There is no axillary adenopathy. Mild diffuse subcutaneous edema. The chest wall soft tissues are otherwise unremarkable. The there is osteopenia with degenerative changes of the spine. No acute fracture. Review of the MIP images confirms the above findings. CT ABDOMEN and PELVIS FINDINGS No intra-abdominal free air. Small ascites. There is a 12 x 12 cm fluid collection within the pelvis which appears loculated with enhancing walls concerning for an infected collection and developing abscess. Hepatobiliary: A 1.6 x 1.9 cm focal nodular enhancement in the inferior aspect of the right lobe of the liver (series 501 image 34 is incompletely characterized but most likely represents a portal venous shunting versus a hemangioma. Other etiologies are not excluded. This appears similar to prior studies. There is no intrahepatic biliary ductal dilatation. The gallbladder is mildly distended. There is small amount of layering sludge in the gallbladder fundus. No pericholecystic fluid. Pancreas: A 1 cm low attenuating/cystic listen arising from the anterior aspect of the head of the pancreas is incompletely characterized but may represent a side branch IPMN or retention cyst. Other etiologies are not excluded. MRI may provide better characterisation. There is no dilatation of the main pancreatic duct or associated gland atrophy. Spleen: Normal in size without focal abnormality. Adrenals/Urinary Tract: The adrenal glands, kidneys, visualized ureters appear unremarkable. The urinary bladder is decompressed around a Foley catheter. Stomach/Bowel: There is postsurgical changes of right hemicolectomy with ileotransverse anastomosis in the mid abdomen. Multiple mildly dilated and thickened loops of small bowel throughout the abdomen most compatible with a degree of ileus. Superimposed enteritis is not excluded. Clinical correlation is recommended. There is abutment of bowel loops to the anterior peritoneal wall  compatible with adhesions. There is extensive sigmoid diverticulosis with muscular hypertrophy. No active inflammatory changes. Evaluation of the bowel is limited in the absence of oral contrast. Vascular/Lymphatic: There is moderate aortoiliac atherosclerotic disease. The origins of the celiac axis, SMA, IMA as well as the origins of the renal arteries are patent. The SMV, splenic vein, and main portal vein are patent. No portal venous gas identified. There is no adenopathy. Reproductive: Hysterectomy. Other: There is diffuse mesenteric edema as well as diffuse subcutaneous edema and anasarca. Musculoskeletal: Multilevel degenerative changes of spine. Osteopenia. No acute fracture. IMPRESSION: Interval development of small bilateral pleural effusions with associated partial consolidative changes of the lung bases which may represent atelectasis versus infiltrate. Clinical correlation and follow-up to resolution recommended. No CT evidence of aortic dissection or pulmonary embolism. Large hiatal hernia containing the majority of the stomach. Postsurgical changes of right hemicolectomy with ileotransverse anastomosis. Mildly distended thickened loops of small bowel most compatible with an ileus. Enteritis is not excluded. Clinical correlation is recommended. No evidence of bowel obstruction. Sigmoid diverticulosis. Small ascites, diffuse mesenteric edema and anasarca. Loculated appearing fluid within the pelvis with enhancing wall concerning for an infected collection or developing abscess. Clinical correlation is recommended. Electronically Signed   By: Anner Crete M.D.   On: 08/30/2016 22:02   Ct Abdomen Pelvis W Contrast  Result Date: 08/30/2016 CLINICAL DATA:  82 year old female with shortness of breath and tachycardia. Hypoxia. Recent surgery. History of colon cancer and recent colonoscopy. EXAM: CT ANGIOGRAPHY CHEST CT ABDOMEN AND PELVIS WITH CONTRAST TECHNIQUE: Multidetector CT imaging of the chest  was performed using the standard protocol during bolus administration of intravenous contrast. Multiplanar CT image reconstructions and MIPs were obtained to evaluate the vascular anatomy. Multidetector CT imaging of the abdomen  and pelvis was performed using the standard protocol during bolus administration of intravenous contrast. CONTRAST:  75 cc Isovue 370 COMPARISON:  Chest radiograph dated 08/20/2016 abdominal radiograph dated 08/24/2016. Comparison is also made CT CT of the abdomen pelvis dated 07/31/2016 FINDINGS: CTA CHEST FINDINGS Cardiovascular: There is no cardiomegaly or pericardial effusion. There is slight displacement of the heart anteriorly and to the left hemithorax secondary to mass effect caused by the large hiatal hernia. There is atherosclerotic calcification of the thoracic aorta and aortic arch. No aneurysmal dilatation or evidence of dissection. The origins of the great vessels of the aortic arch appear patent. No CT evidence of pulmonary embolism. Mediastinum/Nodes: There is no hilar or mediastinal adenopathy. There is free large hiatal hernia containing the majority of the stomach with organo-axial gastric volvulus. There is air distention of the herniated portion of the stomach without definite evidence of gastric outlet obstruction. The esophagus is grossly unremarkable. The thyroid gland is atrophic. No thyroid nodules identified. Lungs/Pleura: There are small bilateral pleural effusions with associated partial compressive consolidation of the lower lobes, new since the CT of 07/31/2016. Pneumonia is not excluded. Clinical correlation and follow-up to resolution is recommended. There is no pneumothorax. The central airways are patent. Musculoskeletal: There is no axillary adenopathy. Mild diffuse subcutaneous edema. The chest wall soft tissues are otherwise unremarkable. The there is osteopenia with degenerative changes of the spine. No acute fracture. Review of the MIP images confirms  the above findings. CT ABDOMEN and PELVIS FINDINGS No intra-abdominal free air. Small ascites. There is a 12 x 12 cm fluid collection within the pelvis which appears loculated with enhancing walls concerning for an infected collection and developing abscess. Hepatobiliary: A 1.6 x 1.9 cm focal nodular enhancement in the inferior aspect of the right lobe of the liver (series 501 image 34 is incompletely characterized but most likely represents a portal venous shunting versus a hemangioma. Other etiologies are not excluded. This appears similar to prior studies. There is no intrahepatic biliary ductal dilatation. The gallbladder is mildly distended. There is small amount of layering sludge in the gallbladder fundus. No pericholecystic fluid. Pancreas: A 1 cm low attenuating/cystic listen arising from the anterior aspect of the head of the pancreas is incompletely characterized but may represent a side branch IPMN or retention cyst. Other etiologies are not excluded. MRI may provide better characterisation. There is no dilatation of the main pancreatic duct or associated gland atrophy. Spleen: Normal in size without focal abnormality. Adrenals/Urinary Tract: The adrenal glands, kidneys, visualized ureters appear unremarkable. The urinary bladder is decompressed around a Foley catheter. Stomach/Bowel: There is postsurgical changes of right hemicolectomy with ileotransverse anastomosis in the mid abdomen. Multiple mildly dilated and thickened loops of small bowel throughout the abdomen most compatible with a degree of ileus. Superimposed enteritis is not excluded. Clinical correlation is recommended. There is abutment of bowel loops to the anterior peritoneal wall compatible with adhesions. There is extensive sigmoid diverticulosis with muscular hypertrophy. No active inflammatory changes. Evaluation of the bowel is limited in the absence of oral contrast. Vascular/Lymphatic: There is moderate aortoiliac atherosclerotic  disease. The origins of the celiac axis, SMA, IMA as well as the origins of the renal arteries are patent. The SMV, splenic vein, and main portal vein are patent. No portal venous gas identified. There is no adenopathy. Reproductive: Hysterectomy. Other: There is diffuse mesenteric edema as well as diffuse subcutaneous edema and anasarca. Musculoskeletal: Multilevel degenerative changes of spine. Osteopenia. No acute fracture. IMPRESSION: Interval  development of small bilateral pleural effusions with associated partial consolidative changes of the lung bases which may represent atelectasis versus infiltrate. Clinical correlation and follow-up to resolution recommended. No CT evidence of aortic dissection or pulmonary embolism. Large hiatal hernia containing the majority of the stomach. Postsurgical changes of right hemicolectomy with ileotransverse anastomosis. Mildly distended thickened loops of small bowel most compatible with an ileus. Enteritis is not excluded. Clinical correlation is recommended. No evidence of bowel obstruction. Sigmoid diverticulosis. Small ascites, diffuse mesenteric edema and anasarca. Loculated appearing fluid within the pelvis with enhancing wall concerning for an infected collection or developing abscess. Clinical correlation is recommended. Electronically Signed   By: Anner Crete M.D.   On: 08/30/2016 22:02     Assessment/Plan  Labs and CT scan done at [facility] are personally reviewed. I have discussed this patient with nursing supervisor and prime doc for admission to the ICU.  The patient clearly has a pelvic abscess which is well away from the area of the anastomosis which is an ileotransverse colotomy. The etiology of this is not quite clear but she definitely would benefit from CT-guided drainage which has been ordered for in the morning. She has been given a dose of Zosyn and vancomycin and I will reorder that as well to continue and rely on cultures from the  pelvic drainage. She requires ICU admission due to her recent drop in blood pressure and her need for BiPAP.  This is discussed with the patient and her daughter and they understood and agreed with this plan.  Florene Glen, MD, FACS

## 2016-08-31 NOTE — Progress Notes (Signed)
Pt seen and examined s/p combined procedure HALS Right hemicolectomy and TAH and BSO by Dr. Leonides Schanz. Admitted with sepsis and large pelvic collection, initially thought to be an abscess but it is clearly a urinoma. D/W Dr. Louis Meckel and given her hx it is thought to be a distal uereteral injury vs a bladder injury. Ct reviewed and her anastomosis is intact and away from the urinoma. Her PE she is is NAD her abdomen is soft but mildly distended. No peritonitis. Perc  Drain w urine. Incisions healing well w/o infection. D/W Dr Louis Meckel and I will assist him during the cystoscopy/ gram possible stent vs open repair of urological injury. D/W pt and family in detail. Dr. Leonides Schanz not available at this time but she is aware of the situation.

## 2016-08-31 NOTE — Progress Notes (Signed)
1350 down to specials with IV intact.

## 2016-08-31 NOTE — ED Triage Notes (Signed)
Patient is a transfer from [facility] ER to see Dr Burt Knack. Patient with post op pelvic abscess following colon resection and hysterectomy. Patient has received 100 mcg of Fentanyl and 1 gram of Vancomyin in addition to 3.375 of Zosyn. Patient arrives to the ED on BiPAP and is alert and oriented and able to answer questions without difficulty.

## 2016-08-31 NOTE — Procedures (Signed)
Post-Procedure Note  Pre-operative Diagnosis: Pelvic fluid collection after abdominal surgery, thought to be an abscess       Post-operative Diagnosis: Urine ascites, compatible with ureter or bladder leak.   Indications:  Abdominal pain, pelvic fluid collection and elevated WBC.  Procedure Details:   Informed consent obtained.  CT guided drain placed in large pelvic fluid collection.  10 French drain placed.  Yellow fluid aspirated.  Findings: CT images demonstrate high density contrast in pelvic fluid collection c/w urine leak.  Site of urine leak not known.    Complications: None     Condition: Stable  Plan: Follow drain output.  Send fluid for culture.  Will need Urology consultation.

## 2016-08-31 NOTE — Anesthesia Procedure Notes (Signed)
Procedure Name: Intubation Date/Time: 09/02/2016 10:17 PM Performed by: Nelda Marseille Pre-anesthesia Checklist: Patient identified, Patient being monitored, Timeout performed, Emergency Drugs available and Suction available Patient Re-evaluated:Patient Re-evaluated prior to inductionOxygen Delivery Method: Circle system utilized Preoxygenation: Pre-oxygenation with 100% oxygen Intubation Type: IV induction Ventilation: Mask ventilation without difficulty Laryngoscope Size: Mac and 3 Grade View: Grade II Tube type: Oral Tube size: 7.0 mm Number of attempts: 1 Airway Equipment and Method: Stylet Placement Confirmation: ETT inserted through vocal cords under direct vision,  positive ETCO2 and breath sounds checked- equal and bilateral Secured at: 21 cm Tube secured with: Tape Dental Injury: Teeth and Oropharynx as per pre-operative assessment

## 2016-08-31 NOTE — Progress Notes (Signed)
Chaplain was making his rounds and visited with pt in [place]. Provided the ministry of prayer.    08/22/2016 1110  Clinical Encounter Type  Visited With Patient  Visit Type Initial;Spiritual support  Referral From Nurse  Spiritual Encounters  Spiritual Needs Prayer

## 2016-08-31 NOTE — ED Notes (Signed)
Pt currently has liters #3 & #4 infusing slowly from [facility]. Pt's daughter with patient at bedside.

## 2016-08-31 NOTE — ED Provider Notes (Signed)
1:00 AM  Assumed care from Dr. Lavena Bullion and Dr. Roxanne Mins.  Pt is a 82 y.o. female who presented to the emergency department with shortness of breath and abdominal pain that started today. Patient was tachycardic, hypotensive. She is 10 days postop from hysterectomy and hemicolectomy by surgeon at [facility]. Workup has revealed leukocytosis with left shift, elevated lactate, bilateral small pleural effusions with consolidative changes which may be atelectasis versus infiltrate, large hiatal hernia, ileus, and a loculated appearing fluid collection within the pelvis concerning for developing abscess that is 12 x 12 cm. Plan was to transfer patient back to [facility]. Patient was then to be an ER to ER transfer and be seen by Dr. Phoebe Perch. I was asked by the resident to reassess the patient. She seems to be declining. On my evaluation, patient was tachycardic, tachypneic, hypotensive and sats of 86% on 5 L nasal cannula. This improved to 96% with a nonrebreather. She does not wear oxygen chronically. Lungs are rhonchorous diffusely. No history of asthma, COPD or CHF. She states she feel short of breath because she feels she can't take a deep breath because of abdominal pain. Will give dose of IV fentanyl. Have discussed risk and benefits of intubation with patient and family at this time they would like to hold on invasive procedures such as intubation and would like for [place] to start her on BiPAP. She has received 2 L of IV fluids. We will give another 2 L IV fluid bolus. She has not yet received her antibiotics. We'll give broad-spectrum antibiotics with IV vancomycin and Zosyn.  We have discussed the possibility of vasopressors which family would agree to if needed but again would like to hold off on invasive procedures at this time if possible. I will update the surgeon at [facility]. It also appears that our general surgeon Dr. Gershon Crane was consulted who recommended she be transferred to the  facility that originally cared for her. This time I do not feel she is stable to get on an ambulance to go to [facility].   2:00 AM  Pt's blood pressure is 117/72. Heart rate 109. Satting 99% on FiO2 of 100% on BiPAP. Respiratory rate has improved. Pain is improved. Received both Zosyn and vancomycin and 2 L of fluid. Updated Dr. Phoebe Perch, surgeon at [facility]. I feel she is stable at this port for transfer. [facility] at bedside. They have been updated as well. Patient was transferred here to ER. I will update ER physician at [facility].   [place] Performed by: Nyra Jabs   Total critical care time: 45 minutes  Critical care time was exclusive of separately billable procedures and treating other patients.  Critical care was necessary to treat or prevent imminent or life-threatening deterioration.  Critical care was time spent personally by me on the following activities: development of treatment plan with patient and/or surrogate as well as nursing, discussions with consultants, evaluation of patient's response to treatment, examination of patient, obtaining history from patient or surrogate, ordering and performing treatments and interventions, ordering and review of laboratory studies, ordering and review of radiographic studies, pulse oximetry and re-evaluation of patient's condition.    [place], DO 08/17/2016 [phone]

## 2016-08-31 NOTE — Anesthesia Preprocedure Evaluation (Signed)
Anesthesia Evaluation  Patient identified by MRN, date of birth, ID band Patient awake    Reviewed: Allergy & Precautions, H&P , NPO status , Patient's Chart, lab work & pertinent test results, reviewed documented beta blocker date and time   History of Anesthesia Complications (+) history of anesthetic complications (low BP during colonoscopy)  Airway Mallampati: II  TM Distance: >3 FB Neck ROM: full    Dental  (+) Teeth Intact   Pulmonary neg pulmonary ROS, neg sleep apnea, neg COPD,    Pulmonary exam normal - rhonchi (-) wheezing      Cardiovascular Exercise Tolerance: Good (-) hypertension(-) CAD and (-) Past MI negative cardio ROS Normal cardiovascular exam Rhythm:regular Rate:Normal - Systolic murmurs and - Diastolic murmurs    Neuro/Psych negative neurological ROS  negative psych ROS   GI/Hepatic negative GI ROS, Neg liver ROS,   Endo/Other  negative endocrine ROSneg diabetes  Renal/GU negative Renal ROS  negative genitourinary   Musculoskeletal   Abdominal (+) - obese,   Peds  Hematology negative hematology ROS (+)   Anesthesia Other Findings Past Medical History: No date: Complication of anesthesia     Comment: BP dropped, Dr. Staci Acosta had to stop the               procedure. No date: Hypotension No date: Medical history non-contributory Past Surgical History: 1962 and 1969: CESAREAN SECTION No date: COLONOSCOPY 06/30/2016: COLONOSCOPY WITH PROPOFOL N/A     Comment: Procedure: COLONOSCOPY WITH PROPOFOL;                Surgeon: Lollie Sails, MD;  Location: [facility]              ENDOSCOPY;  Service: Endoscopy;  Laterality:               N/A; No date: EYE SURGERY     Comment: cataract with IOL Bilateral 07/10/2016: HYSTEROSCOPY W/D&C N/A     Comment: Procedure: DILATATION AND CURETTAGE               /HYSTEROSCOPY WITH ENDOMETRIAL BIOPSY;                Surgeon: Honor Loh Ward, MD;  Location: [facility]                ORS;  Service: Gynecology;  Laterality: N/A; 04/01/2016: LAPAROSCOPIC APPENDECTOMY N/A     Comment: Procedure: APPENDECTOMY LAPAROSCOPIC;                Surgeon: Jules Husbands, MD;  Location: [facility]               ORS;  Service: General;  Laterality: N/A; BMI    Body Mass Index:  22.63 kg/m     Reproductive/Obstetrics negative OB ROS                             Anesthesia Physical  Anesthesia Plan  ASA: II  Anesthesia Plan: General ETT   Post-op Pain Management:    Induction: Intravenous  Airway Management Planned:   Additional Equipment:   Intra-op Plan:   Post-operative Plan:   Informed Consent: I have reviewed the patients History and Physical, chart, labs and discussed the procedure including the risks, benefits and alternatives for the proposed anesthesia with the patient or authorized representative who has indicated his/her understanding and acceptance.   Dental Advisory Given  Plan Discussed with: CRNA  Anesthesia Plan Comments:  Anesthesia Quick Evaluation  

## 2016-08-31 NOTE — ED Provider Notes (Signed)
[facility] Emergency Department Provider Note   ____________________________________________   First MD Initiated Contact with Patient 08/30/2016 [phone]     (approximate)  I have reviewed the triage vital signs and the nursing notes.   HISTORY  Chief Complaint Post-op Problem (Transfer from [facility])    HPI Katherine Medina is a 82 y.o. female transferred from [facility] ED to our emergency department with postoperative pelvic abscess following resection and hysterectomy10 days ago at this facility. She presented to the outside hospital with abdominal pain and shortness of breath. She was found to have leukocytosis, small bilateral pleural effusions, pelvic abscess seen on CT scan. She received 4 L IV fluids and antibiotics prior to arrival. She did decompensate from a respiratory standpoint and became hypoxic requiring BiPAP. Arrives to this facility normotensive and breathing comfortably on BiPAP.   Past Medical History:  Diagnosis Date  . Cancer ([place])   . Complication of anesthesia    BP dropped, Dr. Staci Acosta had to stop the procedure.  . Hypotension   . Medical history non-contributory     Patient Active Problem List   Diagnosis Date Noted  . Pelvic abscess in female 08/24/2016  . Colon cancer ([place]) 08/20/2016  . Malignant neoplasm of ascending colon ([place])   . Thickened endometrium 06/12/2016  . Colon polyps 05/05/2016  . Appendicitis with perforation 04/01/2016    Past Surgical History:  Procedure Laterality Date  . [place]  . COLON RESECTION N/A 08/20/2016   Procedure: HAND ASSISTED LAPAROSCOPIC COLON RESECTION/ HEMI COLECTOMY;  Surgeon: Jules Husbands, MD;  Location: [facility] ORS;  Service: General;  Laterality: N/A;  . COLONOSCOPY    . COLONOSCOPY WITH PROPOFOL N/A 06/30/2016   Procedure: COLONOSCOPY WITH PROPOFOL;  Surgeon: Lollie Sails, MD;  Location: [facility] ENDOSCOPY;  Service: Endoscopy;  Laterality: N/A;  . EYE  SURGERY     cataract with IOL Bilateral  . HYSTEROSCOPY W/D&C N/A 07/10/2016   Procedure: DILATATION AND CURETTAGE /HYSTEROSCOPY WITH ENDOMETRIAL BIOPSY;  Surgeon: Honor Loh Ward, MD;  Location: [facility] ORS;  Service: Gynecology;  Laterality: N/A;  . LAPAROSCOPIC APPENDECTOMY N/A 04/01/2016   Procedure: APPENDECTOMY LAPAROSCOPIC;  Surgeon: Jules Husbands, MD;  Location: [facility] ORS;  Service: General;  Laterality: N/A;  . LAPAROSCOPIC HYSTERECTOMY N/A 08/20/2016   Procedure: HYSTERECTOMY TOTAL LAPAROSCOPIC WITH BSO;  Surgeon: Honor Loh Ward, MD;  Location: [facility] ORS;  Service: Gynecology;  Laterality: N/A;  . PARTIAL COLECTOMY N/A 08/20/2016   Procedure: PARTIAL COLECTOMY;  Surgeon: Jules Husbands, MD;  Location: [facility] ORS;  Service: General;  Laterality: N/A;    Prior to Admission medications   Medication Sig Start Date End Date Taking? Authorizing Provider  acetaminophen (TYLENOL) 500 MG tablet Take 500-1,000 mg by mouth every 6 (six) hours as needed (for pain).   Yes Historical Provider, MD  Calcium Carbonate-Vitamin D (CALTRATE 600+D PO) Take 1 tablet by mouth daily.   Yes Historical Provider, MD  HYDROcodone-acetaminophen (NORCO/VICODIN) 5-325 MG tablet Take 1-2 tablets by mouth every 6 (six) hours as needed for moderate pain. 08/28/16  Yes Diego Sarita Haver, MD    Allergies Patient has no known allergies.  Family History  Problem Relation Age of Onset  . Diabetes Mother   . Hypertension Father   . Colon cancer Neg Hx     Social History Social History  Substance Use Topics  . Smoking status: Never Smoker  . Smokeless tobacco: Never Used  . Alcohol use  No    Review of Systems  Constitutional: No fever/chills. Eyes: No visual changes. ENT: No sore throat. Cardiovascular: Denies chest pain. Respiratory: Positive for shortness of breath. Gastrointestinal: Positive for abdominal pain.  No nausea, no vomiting.  No diarrhea.  No constipation. Genitourinary: Negative for  dysuria. Musculoskeletal: Negative for back pain. Skin: Negative for rash. Neurological: Negative for headaches, focal weakness or numbness.  10-point ROS otherwise negative.  ____________________________________________   PHYSICAL EXAM:  VITAL SIGNS: ED Triage Vitals  Enc Vitals Group     BP 08/29/2016 0320 (!) 119/52     Pulse Rate 08/22/2016 0320 (!) 101     Resp 08/30/2016 0320 20     Temp --      Temp src --      SpO2 08/27/2016 0320 92 %     Weight 09/11/2016 0320 136 lb (61.7 kg)     Height 09/01/2016 0320 5\' 2"  (1.575 m)     Head Circumference --      Peak Flow --      Pain Score 09/12/2016 0321 4     Pain Loc --      Pain Edu? --      Excl. in [place]? --     Constitutional: Alert and oriented. Ill appearing and in mild acute distress. Eyes: Conjunctivae are normal. PERRL. EOMI. Head: Atraumatic. Nose: No congestion/rhinnorhea. Mouth/Throat: Mucous membranes are moist.  Oropharynx non-erythematous. Neck: No stridor.   Cardiovascular: Normal rate, regular rhythm. Grossly normal heart sounds.  Good peripheral circulation. Respiratory: Normal respiratory effort.  No retractions. Lungs with faint bibasilar rales. Gastrointestinal: Soft and diffusely tender to palpation with voluntary guarding. No distention. No abdominal bruits. No CVA tenderness. Musculoskeletal: No lower extremity tenderness. 1+ BLE pitting edema.  No joint effusions. Neurologic:  Normal speech and language. No gross focal neurologic deficits are appreciated.  Skin:  Skin is warm, dry and intact. No rash noted. Psychiatric: Mood and affect are normal. Speech and behavior are normal.  ____________________________________________   LABS (all labs ordered are listed, but only abnormal results are displayed)  Labs Reviewed  BASIC METABOLIC PANEL  CBC  BLOOD GAS, ARTERIAL    ____________________________________________  EKG  None ____________________________________________  RADIOLOGY  None ____________________________________________   PROCEDURES  Procedure(s) performed: None  Procedures  Critical Care performed: No  ____________________________________________   INITIAL IMPRESSION / ASSESSMENT AND PLAN / ED COURSE  Pertinent labs & imaging results that were available during my care of the patient were reviewed by me and considered in my medical decision making (see chart for details).  82 year old female transferred from [facility] ED for postoperative pelvic abscess, sepsis and respiratory distress requiring BiPAP. She was seen by Dr. Burt Knack from general surgery upon her arrival and will be admitted to the ICU.   Clinical Course      ____________________________________________   FINAL CLINICAL IMPRESSION(S) / ED DIAGNOSES  Final diagnoses:  Pelvic abscess in female  Generalized abdominal pain  Respiratory distress  Sepsis, due to unspecified organism ([place])  Postoperative infection, initial encounter  Hypoxia      NEW MEDICATIONS STARTED DURING THIS VISIT:  New Prescriptions   No medications on file     Note:  This document was prepared using Dragon voice recognition software and may include unintentional dictation errors.    Paulette Blanch, MD 08/15/2016 [phone]

## 2016-08-31 NOTE — Progress Notes (Signed)
Sent the Pt  To OR.

## 2016-08-31 NOTE — Progress Notes (Signed)
Awaiting CT and drainage of abscess per radiology.

## 2016-08-31 NOTE — H&P (Signed)
I have been asked to see the patient by Dr. Clayburn Pert, for evaluation and management of concern of urinary tract injury.  History of present illness: 82 year old female who is status post laparoscopic hand-assisted right hemicolectomy and hysterectomy. She is postop day #11. She presented to the [facility] emergency department with acute onset abdominal pain yesterday afternoon. She had associated nausea and vomiting. She also developed some hypotension that was responsive to IV fluid resuscitation. A CT scan performed in the emergency department demonstrated a large pelvic fluid collection. She was then transferred down to [facility] for further care, and given that this was where she had her initial surgery. Today, the patient underwent a drain placement of her fluid collection, this returned as urine. Given that she likely had a urinary tract injury from her initial surgery, urology was consult.  The patient's postoperative course was somewhat, located including a postoperative ileus requiring NG tube initially as well as postop urinary retention.  Her Foley catheter was replaced and the patient was scheduled for a catheter removal in the urology clinic on December 28.  The patient has a history of Lynch syndrome. She recently underwent a laparoscopic appendectomy prior to her current or most recent operation including right colectomy for colon cancer and a hysterectomy for abnormal appearing endometrium.   Review of systems: A 12 point comprehensive review of systems was obtained and is negative unless otherwise stated in the history of present illness.  Patient Active Problem List   Diagnosis Date Noted  . Pelvic abscess in female 09/10/2016  . Colon cancer ([place]) 08/20/2016  . Malignant neoplasm of ascending colon ([place])   . Thickened endometrium 06/12/2016  . Colon polyps 05/05/2016  . Appendicitis with perforation 04/01/2016    No current facility-administered medications on file prior to  encounter.    Current Outpatient Prescriptions on File Prior to Encounter  Medication Sig Dispense Refill  . acetaminophen (TYLENOL) 500 MG tablet Take 500-1,000 mg by mouth every 6 (six) hours as needed (for pain).    . Calcium Carbonate-Vitamin D (CALTRATE 600+D PO) Take 1 tablet by mouth daily.    Marland Kitchen HYDROcodone-acetaminophen (NORCO/VICODIN) 5-325 MG tablet Take 1-2 tablets by mouth every 6 (six) hours as needed for moderate pain. 30 tablet 0    Past Medical History:  Diagnosis Date  . Cancer ([place])   . Complication of anesthesia    BP dropped, Dr. Staci Acosta had to stop the procedure.  . Hypotension   . Medical history non-contributory     Past Surgical History:  Procedure Laterality Date  . [place]  . COLON RESECTION N/A 08/20/2016   Procedure: HAND ASSISTED LAPAROSCOPIC COLON RESECTION/ HEMI COLECTOMY;  Surgeon: Jules Husbands, MD;  Location: [facility] ORS;  Service: General;  Laterality: N/A;  . COLONOSCOPY    . COLONOSCOPY WITH PROPOFOL N/A 06/30/2016   Procedure: COLONOSCOPY WITH PROPOFOL;  Surgeon: Lollie Sails, MD;  Location: [facility] ENDOSCOPY;  Service: Endoscopy;  Laterality: N/A;  . EYE SURGERY     cataract with IOL Bilateral  . HYSTEROSCOPY W/D&C N/A 07/10/2016   Procedure: DILATATION AND CURETTAGE /HYSTEROSCOPY WITH ENDOMETRIAL BIOPSY;  Surgeon: Honor Loh Ward, MD;  Location: [facility] ORS;  Service: Gynecology;  Laterality: N/A;  . LAPAROSCOPIC APPENDECTOMY N/A 04/01/2016   Procedure: APPENDECTOMY LAPAROSCOPIC;  Surgeon: Jules Husbands, MD;  Location: [facility] ORS;  Service: General;  Laterality: N/A;  . LAPAROSCOPIC HYSTERECTOMY N/A 08/20/2016   Procedure: HYSTERECTOMY TOTAL LAPAROSCOPIC WITH BSO;  Surgeon:  Honor Loh Ward, MD;  Location: [facility] ORS;  Service: Gynecology;  Laterality: N/A;  . PARTIAL COLECTOMY N/A 08/20/2016   Procedure: PARTIAL COLECTOMY;  Surgeon: Jules Husbands, MD;  Location: [facility] ORS;  Service: General;  Laterality: N/A;    Social History   Substance Use Topics  . Smoking status: Never Smoker  . Smokeless tobacco: Never Used  . Alcohol use No    Family History  Problem Relation Age of Onset  . Diabetes Mother   . Hypertension Father   . Colon cancer Neg Hx     PE: Vitals:   09/10/2016 1715 08/18/2016 1730 09/01/2016 1800 08/25/2016 1830  BP:  (!) 106/49 114/69 (!) 108/51  Pulse: 94 (!) 103 (!) 102 (!) 110  Resp: 17 (!) 30 (!) 22 (!) 27  Temp:      TempSrc:      SpO2:  94% 91% 92%  Weight:      Height:       Patient appears to be in no acute distress  patient is alert and oriented x3 Atraumatic normocephalic head No cervical or supraclavicular lymphadenopathy appreciated No increased work of breathing, no audible wheezes/rhonchi Sinus tach with a regular rhythm  Abdomen is soft, nontender, nondistended, no CVA or suprapubic tenderness, minimal right sided port site tenderness. The patient has a drain emanating from her right gluteus which is draining clear yellow fluid. Her Foley catheter is in place draining blood-tinged urine.  Lower extremities are symmetric without appreciable edema Grossly neurologically intact No identifiable skin lesions   Recent Labs  08/30/16 1923 08/21/2016 0453  WBC 16.7* 45.3*  HGB 12.8 11.3*  HCT 38.0 34.0*    Recent Labs  08/30/16 1923 08/25/2016 0453  NA 131* 139  K 4.5 3.9  CL 98* 109  CO2 21* 23  GLUCOSE 135* 114*  BUN 16 19  CREATININE 1.42* 1.31*  CALCIUM 7.9* 7.2*    Recent Labs  08/23/2016 1140  INR 1.45   No results for input(s): LABURIN in the last 72 hours. Results for orders placed or performed during the hospital encounter of 08/22/2016  MRSA PCR Screening     Status: None   Collection Time: 09/12/2016  4:35 AM  Result Value Ref Range Status   MRSA by PCR NEGATIVE NEGATIVE Final    Comment:        The GeneXpert MRSA Assay (FDA approved for NASAL specimens only), is one component of a comprehensive MRSA colonization surveillance program. It is  not intended to diagnose MRSA infection nor to guide or monitor treatment for MRSA infections.     Imaging: I've independently reviewed the patient's CAT scan which demonstrates no evidence of hydronephrosis. There is a large fluid collection that is extending up into the retroperitoneum in the mid ureteral region. There is no contrast in the ureters to clearly delineate them or in the bladder. There is no clear area of injury based on today's CAT scan..  Imp: Unfortunately, this 77 year old otherwise reasonably healthy patient has a likely urinary tract injury resulting from her previous operation. However, the images do not suggest the proximate location.  Discussion:  Together with the patient's family I went over the patient's findings and we discussed the management options. I discussed the option of repeat a CT scan with contrast to try to localize the leak and then coming up with a more formal plan afterwards; versus, taking the patient to the operating room tonight and doing a diagnostic evaluation including cystogram, cystoscopy, bilateral  retrograde pyelograms and then taking the appropriate intervention to fix the patient's problem. This would either be placing ureteral stents, performing a ureteral reimplant or some sort of ureteral reconstructive procedure, or fixing the patient's bladder injury. We discussed the rationale for immediate repair versus delayed repair, and I told the patient that if we did not fix it over the next day or 2 that we would likely have to wait at least 6 weeks prior to attempting any sort of repair. Having heard all of this the patient and her family have opted to proceed to the operating room with the above plan.   Louis Meckel W

## 2016-08-31 NOTE — Sedation Documentation (Signed)
Left forearm iv pulled out during transfer to abdomen on CT table. Iv fluid connected to right arm iv, pt complained about site burning. 22 gauge piv placed by Kirat Mezquita Sear right forearm. NS infusion

## 2016-08-31 NOTE — Progress Notes (Signed)
CC: Abdominal pain Subjective: Patient admitted overnight with progressively worsening abdominal pain. Patient reports some improvement since admission.  Objective: Vital signs in last 24 hours: Temp:  [97.6 F (36.4 C)-99.1 F (37.3 C)] 98.4 F (36.9 C) (12/18 1200) Pulse Rate:  [77-135] 115 (12/18 1455) Resp:  [17-39] 26 (12/18 1455) BP: (76-144)/(36-64) 107/50 (12/18 1455) SpO2:  [3 %-100 %] 98 % (12/18 1455) Weight:  [61.7 kg (136 lb)-74.6 kg (164 lb 7.4 oz)] 74.6 kg (164 lb 7.4 oz) (12/18 0427) Last BM Date: 08/29/16  Intake/Output from previous day: 12/17 0701 - 12/18 0700 In: 125 [P.O.:125] Out: -  Intake/Output this shift: Total I/O In: 175 [P.O.:125; IV Piggyback:50] Out: 425 [Urine:425]  Physical exam:  Gen.: No acute distress Chest: Clear to auscultation Heart: Tachycardic Abdomen: Soft, mildly tender to palpation in the lower abdomen and mildly distended.  Lab Results: CBC   Recent Labs  08/30/16 1923 09/01/2016 0453  WBC 16.7* 45.3*  HGB 12.8 11.3*  HCT 38.0 34.0*  PLT 488* 387   BMET  Recent Labs  08/30/16 1923 09/09/2016 0453  NA 131* 139  K 4.5 3.9  CL 98* 109  CO2 21* 23  GLUCOSE 135* 114*  BUN 16 19  CREATININE 1.42* 1.31*  CALCIUM 7.9* 7.2*   PT/INR  Recent Labs  09/03/2016 1140  LABPROT 17.8*  INR 1.45   ABG  Recent Labs  08/20/2016 0131 09/05/2016 0444  PHART 7.417 7.47*  HCO3 20.4 21.8    Studies/Results: Ct Angio Chest Pe W And/or Wo Contrast  Result Date: 08/30/2016 CLINICAL DATA:  82 year old female with shortness of breath and tachycardia. Hypoxia. Recent surgery. History of colon cancer and recent colonoscopy. EXAM: CT ANGIOGRAPHY CHEST CT ABDOMEN AND PELVIS WITH CONTRAST TECHNIQUE: Multidetector CT imaging of the chest was performed using the standard protocol during bolus administration of intravenous contrast. Multiplanar CT image reconstructions and MIPs were obtained to evaluate the vascular anatomy. Multidetector  CT imaging of the abdomen and pelvis was performed using the standard protocol during bolus administration of intravenous contrast. CONTRAST:  75 cc Isovue 370 COMPARISON:  Chest radiograph dated 08/20/2016 abdominal radiograph dated 08/24/2016. Comparison is also made CT CT of the abdomen pelvis dated 07/31/2016 FINDINGS: CTA CHEST FINDINGS Cardiovascular: There is no cardiomegaly or pericardial effusion. There is slight displacement of the heart anteriorly and to the left hemithorax secondary to mass effect caused by the large hiatal hernia. There is atherosclerotic calcification of the thoracic aorta and aortic arch. No aneurysmal dilatation or evidence of dissection. The origins of the great vessels of the aortic arch appear patent. No CT evidence of pulmonary embolism. Mediastinum/Nodes: There is no hilar or mediastinal adenopathy. There is free large hiatal hernia containing the majority of the stomach with organo-axial gastric volvulus. There is air distention of the herniated portion of the stomach without definite evidence of gastric outlet obstruction. The esophagus is grossly unremarkable. The thyroid gland is atrophic. No thyroid nodules identified. Lungs/Pleura: There are small bilateral pleural effusions with associated partial compressive consolidation of the lower lobes, new since the CT of 07/31/2016. Pneumonia is not excluded. Clinical correlation and follow-up to resolution is recommended. There is no pneumothorax. The central airways are patent. Musculoskeletal: There is no axillary adenopathy. Mild diffuse subcutaneous edema. The chest wall soft tissues are otherwise unremarkable. The there is osteopenia with degenerative changes of the spine. No acute fracture. Review of the MIP images confirms the above findings. CT ABDOMEN and PELVIS FINDINGS No intra-abdominal free air.  Small ascites. There is a 12 x 12 cm fluid collection within the pelvis which appears loculated with enhancing walls  concerning for an infected collection and developing abscess. Hepatobiliary: A 1.6 x 1.9 cm focal nodular enhancement in the inferior aspect of the right lobe of the liver (series 501 image 34 is incompletely characterized but most likely represents a portal venous shunting versus a hemangioma. Other etiologies are not excluded. This appears similar to prior studies. There is no intrahepatic biliary ductal dilatation. The gallbladder is mildly distended. There is small amount of layering sludge in the gallbladder fundus. No pericholecystic fluid. Pancreas: A 1 cm low attenuating/cystic listen arising from the anterior aspect of the head of the pancreas is incompletely characterized but may represent a side branch IPMN or retention cyst. Other etiologies are not excluded. MRI may provide better characterisation. There is no dilatation of the main pancreatic duct or associated gland atrophy. Spleen: Normal in size without focal abnormality. Adrenals/Urinary Tract: The adrenal glands, kidneys, visualized ureters appear unremarkable. The urinary bladder is decompressed around a Foley catheter. Stomach/Bowel: There is postsurgical changes of right hemicolectomy with ileotransverse anastomosis in the mid abdomen. Multiple mildly dilated and thickened loops of small bowel throughout the abdomen most compatible with a degree of ileus. Superimposed enteritis is not excluded. Clinical correlation is recommended. There is abutment of bowel loops to the anterior peritoneal wall compatible with adhesions. There is extensive sigmoid diverticulosis with muscular hypertrophy. No active inflammatory changes. Evaluation of the bowel is limited in the absence of oral contrast. Vascular/Lymphatic: There is moderate aortoiliac atherosclerotic disease. The origins of the celiac axis, SMA, IMA as well as the origins of the renal arteries are patent. The SMV, splenic vein, and main portal vein are patent. No portal venous gas identified.  There is no adenopathy. Reproductive: Hysterectomy. Other: There is diffuse mesenteric edema as well as diffuse subcutaneous edema and anasarca. Musculoskeletal: Multilevel degenerative changes of spine. Osteopenia. No acute fracture. IMPRESSION: Interval development of small bilateral pleural effusions with associated partial consolidative changes of the lung bases which may represent atelectasis versus infiltrate. Clinical correlation and follow-up to resolution recommended. No CT evidence of aortic dissection or pulmonary embolism. Large hiatal hernia containing the majority of the stomach. Postsurgical changes of right hemicolectomy with ileotransverse anastomosis. Mildly distended thickened loops of small bowel most compatible with an ileus. Enteritis is not excluded. Clinical correlation is recommended. No evidence of bowel obstruction. Sigmoid diverticulosis. Small ascites, diffuse mesenteric edema and anasarca. Loculated appearing fluid within the pelvis with enhancing wall concerning for an infected collection or developing abscess. Clinical correlation is recommended. Electronically Signed   By: Anner Crete M.D.   On: 08/30/2016 22:02   Ct Abdomen Pelvis W Contrast  Result Date: 08/30/2016 CLINICAL DATA:  82 year old female with shortness of breath and tachycardia. Hypoxia. Recent surgery. History of colon cancer and recent colonoscopy. EXAM: CT ANGIOGRAPHY CHEST CT ABDOMEN AND PELVIS WITH CONTRAST TECHNIQUE: Multidetector CT imaging of the chest was performed using the standard protocol during bolus administration of intravenous contrast. Multiplanar CT image reconstructions and MIPs were obtained to evaluate the vascular anatomy. Multidetector CT imaging of the abdomen and pelvis was performed using the standard protocol during bolus administration of intravenous contrast. CONTRAST:  75 cc Isovue 370 COMPARISON:  Chest radiograph dated 08/20/2016 abdominal radiograph dated 08/24/2016.  Comparison is also made CT CT of the abdomen pelvis dated 07/31/2016 FINDINGS: CTA CHEST FINDINGS Cardiovascular: There is no cardiomegaly or pericardial effusion. There is slight  displacement of the heart anteriorly and to the left hemithorax secondary to mass effect caused by the large hiatal hernia. There is atherosclerotic calcification of the thoracic aorta and aortic arch. No aneurysmal dilatation or evidence of dissection. The origins of the great vessels of the aortic arch appear patent. No CT evidence of pulmonary embolism. Mediastinum/Nodes: There is no hilar or mediastinal adenopathy. There is free large hiatal hernia containing the majority of the stomach with organo-axial gastric volvulus. There is air distention of the herniated portion of the stomach without definite evidence of gastric outlet obstruction. The esophagus is grossly unremarkable. The thyroid gland is atrophic. No thyroid nodules identified. Lungs/Pleura: There are small bilateral pleural effusions with associated partial compressive consolidation of the lower lobes, new since the CT of 07/31/2016. Pneumonia is not excluded. Clinical correlation and follow-up to resolution is recommended. There is no pneumothorax. The central airways are patent. Musculoskeletal: There is no axillary adenopathy. Mild diffuse subcutaneous edema. The chest wall soft tissues are otherwise unremarkable. The there is osteopenia with degenerative changes of the spine. No acute fracture. Review of the MIP images confirms the above findings. CT ABDOMEN and PELVIS FINDINGS No intra-abdominal free air. Small ascites. There is a 12 x 12 cm fluid collection within the pelvis which appears loculated with enhancing walls concerning for an infected collection and developing abscess. Hepatobiliary: A 1.6 x 1.9 cm focal nodular enhancement in the inferior aspect of the right lobe of the liver (series 501 image 34 is incompletely characterized but most likely represents a  portal venous shunting versus a hemangioma. Other etiologies are not excluded. This appears similar to prior studies. There is no intrahepatic biliary ductal dilatation. The gallbladder is mildly distended. There is small amount of layering sludge in the gallbladder fundus. No pericholecystic fluid. Pancreas: A 1 cm low attenuating/cystic listen arising from the anterior aspect of the head of the pancreas is incompletely characterized but may represent a side branch IPMN or retention cyst. Other etiologies are not excluded. MRI may provide better characterisation. There is no dilatation of the main pancreatic duct or associated gland atrophy. Spleen: Normal in size without focal abnormality. Adrenals/Urinary Tract: The adrenal glands, kidneys, visualized ureters appear unremarkable. The urinary bladder is decompressed around a Foley catheter. Stomach/Bowel: There is postsurgical changes of right hemicolectomy with ileotransverse anastomosis in the mid abdomen. Multiple mildly dilated and thickened loops of small bowel throughout the abdomen most compatible with a degree of ileus. Superimposed enteritis is not excluded. Clinical correlation is recommended. There is abutment of bowel loops to the anterior peritoneal wall compatible with adhesions. There is extensive sigmoid diverticulosis with muscular hypertrophy. No active inflammatory changes. Evaluation of the bowel is limited in the absence of oral contrast. Vascular/Lymphatic: There is moderate aortoiliac atherosclerotic disease. The origins of the celiac axis, SMA, IMA as well as the origins of the renal arteries are patent. The SMV, splenic vein, and main portal vein are patent. No portal venous gas identified. There is no adenopathy. Reproductive: Hysterectomy. Other: There is diffuse mesenteric edema as well as diffuse subcutaneous edema and anasarca. Musculoskeletal: Multilevel degenerative changes of spine. Osteopenia. No acute fracture. IMPRESSION:  Interval development of small bilateral pleural effusions with associated partial consolidative changes of the lung bases which may represent atelectasis versus infiltrate. Clinical correlation and follow-up to resolution recommended. No CT evidence of aortic dissection or pulmonary embolism. Large hiatal hernia containing the majority of the stomach. Postsurgical changes of right hemicolectomy with ileotransverse anastomosis. Mildly distended thickened  loops of small bowel most compatible with an ileus. Enteritis is not excluded. Clinical correlation is recommended. No evidence of bowel obstruction. Sigmoid diverticulosis. Small ascites, diffuse mesenteric edema and anasarca. Loculated appearing fluid within the pelvis with enhancing wall concerning for an infected collection or developing abscess. Clinical correlation is recommended. Electronically Signed   By: Anner Crete M.D.   On: 08/30/2016 22:02    Anti-infectives: Anti-infectives    Start     Dose/Rate Route Frequency Ordered Stop   09/06/2016 0900  piperacillin-tazobactam (ZOSYN) IVPB 3.375 g  Status:  Discontinued     3.375 g 12.5 mL/hr over 240 Minutes Intravenous Every 8 hours 08/30/2016 0337 09/01/2016 0434   09/11/2016 0900  piperacillin-tazobactam (ZOSYN) IVPB 3.375 g     3.375 g 12.5 mL/hr over 240 Minutes Intravenous Every 8 hours 08/19/2016 0434        Assessment/Plan:  82 year old female admitted with postoperative pain was thought to be an abdominal abscess within the pelvis. Interventional radiology was counseled to for attempted drainage of this fluid collection. At the time of this node had been contacted by radiologist that the fluid returning was consistent with urine. This would indicate a likely bladder leak. Urology will be consulted.  Husayn Reim T. Adonis Huguenin, MD, FACS  08/30/2016

## 2016-08-31 NOTE — Progress Notes (Addendum)
Initial Nutrition Assessment  DOCUMENTATION CODES:   Not applicable  INTERVENTION:  1. Monitor for diet advancement per MD/PA/NP following surgery 2. Will provide ONS following  NUTRITION DIAGNOSIS:   Inadequate oral intake related to inability to eat as evidenced by NPO status.  GOAL:   Patient will meet greater than or equal to 90% of their needs  MONITOR:   Diet advancement, Labs, Weight trends, I & O's  REASON FOR ASSESSMENT:   Malnutrition Screening Tool    ASSESSMENT:   This patient was possibly a week out from a right colon resection for colon cancer and concomitant hysterectomy and nephrectomy. She was in her improving state of health at home but noticed worsening abdominal pain at first not controlled by Tylenol then not controlled by Vicodin either therefore 911 was called.  Spoke with pt's family at bedside. Husband reports patient was eating normally PTA - but "wasn't eating much." He reports a usual weight of 130# for her, but pt's weight is up to 164# as a result of edema in lower extremities. Admits to some nausea PTA, no vomiting. Per chart wt was trending downwards, 9#/6.2% insignificant wt loss over 5 months. Nutrition-Focused physical exam completed. Findings are no fat depletion, no muscle depletion, and moderate edema.  Currently NPO  Will monitor for need of ONS and PO intake following surgery.   Labs and medications reviewed: D5 + 1/2 NS + KCL @ 174mL/hr --> 510 calories  Diet Order:  Diet NPO time specified Except for: Sips with Meds  Skin:  Wound (see comment) (Abdominal surgical wounds)  Last BM:  12/18  Height:   Ht Readings from Last 1 Encounters:  08/25/2016 5\' 4"  (1.626 m)    Weight:   Wt Readings from Last 1 Encounters:  09/12/2016 164 lb 7.4 oz (74.6 kg)    Ideal Body Weight:  54.54 kg  BMI:  Body mass index is 28.23 kg/m.  Estimated Nutritional Needs:   Kcal:  1700-2000 calories  Protein:  76-100 gm  Fluid:  >/=  1.7L  EDUCATION NEEDS:   No education needs identified at this time  Satira Anis. Cariana Karge, MS, RD LDN Inpatient Clinical Dietitian Pager [phone]

## 2016-09-01 ENCOUNTER — Encounter: Payer: Self-pay | Admitting: Urology

## 2016-09-01 LAB — BASIC METABOLIC PANEL
ANION GAP: 5 (ref 5–15)
BUN: 21 mg/dL — ABNORMAL HIGH (ref 6–20)
CALCIUM: 6.5 mg/dL — AB (ref 8.9–10.3)
CHLORIDE: 110 mmol/L (ref 101–111)
CO2: 21 mmol/L — AB (ref 22–32)
Creatinine, Ser: 0.94 mg/dL (ref 0.44–1.00)
GFR calc Af Amer: >60 mL/min (ref >60)
GFR calc non Af Amer: 54 mL/min — ABNORMAL LOW (ref >60)
GLUCOSE: 156 mg/dL — AB (ref 65–99)
Potassium: 4.7 mmol/L (ref 3.5–5.1)
Sodium: 136 mmol/L (ref 135–145)

## 2016-09-01 LAB — CBC
HEMATOCRIT: 26.3 % — AB (ref 35.0–47.0)
HEMOGLOBIN: 8.5 g/dL — AB (ref 12.0–16.0)
MCH: 27.1 pg (ref 26.0–34.0)
MCHC: 32.5 g/dL (ref 32.0–36.0)
MCV: 83.3 fL (ref 80.0–100.0)
Platelets: 281 10*3/uL (ref 150–440)
RBC: 3.15 MIL/uL — ABNORMAL LOW (ref 3.80–5.20)
RDW: 15.2 % — AB (ref 11.5–14.5)
WBC: 40.4 10*3/uL — AB (ref 3.6–11.0)

## 2016-09-01 LAB — GLUCOSE, CAPILLARY: GLUCOSE-CAPILLARY: 112 mg/dL — AB (ref 65–99)

## 2016-09-01 MED ORDER — ACETAMINOPHEN 10 MG/ML IV SOLN
INTRAVENOUS | Status: AC
  Filled 2016-09-01: qty 100

## 2016-09-01 MED ORDER — BUPIVACAINE-EPINEPHRINE (PF) 0.25% -1:200000 IJ SOLN
INTRAMUSCULAR | Status: AC
  Filled 2016-09-01: qty 30

## 2016-09-01 MED ORDER — SODIUM CHLORIDE 0.9 % IJ SOLN
INTRAMUSCULAR | Status: AC
  Filled 2016-09-01: qty 50

## 2016-09-01 MED ORDER — BUPIVACAINE-EPINEPHRINE 0.25% -1:200000 IJ SOLN
INTRAMUSCULAR | Status: DC | PRN
  Administered 2016-09-01: 30 mL

## 2016-09-01 MED ORDER — SUGAMMADEX SODIUM 500 MG/5ML IV SOLN
INTRAVENOUS | Status: DC | PRN
  Administered 2016-09-01: 200 mg via INTRAVENOUS

## 2016-09-01 MED ORDER — ACETAMINOPHEN 10 MG/ML IV SOLN
INTRAVENOUS | Status: DC | PRN
  Administered 2016-09-01: 1000 mg via INTRAVENOUS

## 2016-09-01 MED ORDER — MEPERIDINE HCL 25 MG/ML IJ SOLN: 6.2500 mg | INTRAMUSCULAR | Status: DC | PRN

## 2016-09-01 MED ORDER — ACETAMINOPHEN 10 MG/ML IV SOLN
1000.0000 mg | Freq: Four times a day (QID) | INTRAVENOUS | Status: AC
  Administered 2016-09-01 – 2016-09-02 (×4): 1000 mg via INTRAVENOUS
  Filled 2016-09-01 (×4): qty 100

## 2016-09-01 MED ORDER — SODIUM CHLORIDE 0.9 % IR SOLN
Status: DC | PRN
  Administered 2016-09-01: 1000 mL

## 2016-09-01 MED ORDER — PROMETHAZINE HCL 25 MG/ML IJ SOLN: 6.2500 mg | INTRAMUSCULAR | Status: DC | PRN

## 2016-09-01 MED ORDER — METOPROLOL TARTRATE 25 MG/10 ML ORAL SUSPENSION
25.0000 mg | Freq: Two times a day (BID) | ORAL | Status: DC
  Administered 2016-09-01: 25 mg via ORAL
  Filled 2016-09-01 (×3): qty 10

## 2016-09-01 MED ORDER — KETOROLAC TROMETHAMINE 15 MG/ML IJ SOLN
15.0000 mg | Freq: Three times a day (TID) | INTRAMUSCULAR | Status: DC
  Administered 2016-09-01 – 2016-09-02 (×4): 15 mg via INTRAVENOUS
  Filled 2016-09-01 (×4): qty 1

## 2016-09-01 MED ORDER — SODIUM CHLORIDE 0.9 % IV BOLUS (SEPSIS)
500.0000 mL | Freq: Once | INTRAVENOUS | Status: AC
  Administered 2016-09-01: 500 mL via INTRAVENOUS

## 2016-09-01 MED ORDER — FENTANYL CITRATE (PF) 100 MCG/2ML IJ SOLN
INTRAMUSCULAR | Status: AC
  Filled 2016-09-01: qty 2

## 2016-09-01 MED ORDER — ONDANSETRON HCL 4 MG/2ML IJ SOLN
4.0000 mg | Freq: Four times a day (QID) | INTRAMUSCULAR | Status: DC | PRN
  Administered 2016-09-02 – 2016-09-13 (×5): 4 mg via INTRAVENOUS
  Filled 2016-09-01 (×5): qty 2

## 2016-09-01 MED ORDER — SODIUM CHLORIDE 0.9 % IJ SOLN
INTRAMUSCULAR | Status: DC | PRN
  Administered 2016-09-01: 50 mL

## 2016-09-01 MED ORDER — PANTOPRAZOLE SODIUM 40 MG IV SOLR
40.0000 mg | INTRAVENOUS | Status: DC
  Administered 2016-09-01 – 2016-09-02 (×2): 40 mg via INTRAVENOUS
  Filled 2016-09-01 (×2): qty 40

## 2016-09-01 MED ORDER — FENTANYL CITRATE (PF) 100 MCG/2ML IJ SOLN
25.0000 ug | INTRAMUSCULAR | Status: DC | PRN
  Administered 2016-09-01 (×4): 25 ug via INTRAVENOUS

## 2016-09-01 MED ORDER — BUPIVACAINE LIPOSOME 1.3 % IJ SUSP
INTRAMUSCULAR | Status: DC | PRN
  Administered 2016-09-01: 20 mL

## 2016-09-01 NOTE — Progress Notes (Signed)
Spoke with Dr Adonis Huguenin regarding patient heart rate of 135-137. He is in to evaluate patient at bedside. At this point no additional orders and continue to monitor. Patient has not had any complaints of pain.

## 2016-09-01 NOTE — Progress Notes (Signed)
Seen and examined s/p reimplantation of left transected ureter into bladder dome. Doing very well clinically, only issue is Sinus tachy likely related to SIRS She is on appropriate a/bs and is perfusing well No complaints, she tolerated diet and is ready for regular diet. D/w w pt and family about op findings. ( Injury  from Hysterectomy portion of procedure)  PE NAD: Abd: soft, jp, dressing intact. serosanguinous drainage, foley w good output. Ext pedal edema  A/P Start lopressor BP should tolerate it Recheck labs in am Regular diet

## 2016-09-01 NOTE — Progress Notes (Signed)
Urology Consult Follow Up  Subjective: POD 1 s/p left ureteral reimplant.  Awake, alert, pleasant.  Borderline hypotension this AM but runs low chronically.  Higher drain output, mildly purulent.  Pain well controlled.    Anti-infectives: Anti-infectives    Start     Dose/Rate Route Frequency Ordered Stop   09/10/2016 0900  piperacillin-tazobactam (ZOSYN) IVPB 3.375 g  Status:  Discontinued     3.375 g 12.5 mL/hr over 240 Minutes Intravenous Every 8 hours 08/24/2016 0337 09/09/2016 0434   08/30/2016 0900  piperacillin-tazobactam (ZOSYN) IVPB 3.375 g     3.375 g 12.5 mL/hr over 240 Minutes Intravenous Every 8 hours 09/12/2016 0434        Current Facility-Administered Medications  Medication Dose Route Frequency Provider Last Rate Last Dose  . acetaminophen (OFIRMEV) IV 1,000 mg  1,000 mg Intravenous Q6H Ardis Hughs, MD   1,000 mg at 09/01/16 0544  . dextrose 5 % and 0.45 % NaCl with KCl 20 mEq/L infusion   Intravenous Continuous Florene Glen, MD 125 mL/hr at 09/01/16 0925    . heparin injection 5,000 Units  5,000 Units Subcutaneous Q8H Florene Glen, MD   5,000 Units at 09/01/16 0544  . HYDROcodone-acetaminophen (NORCO/VICODIN) 5-325 MG per tablet 1-2 tablet  1-2 tablet Oral Q6H PRN Florene Glen, MD   1 tablet at 08/30/2016 0545  . HYDROmorphone (DILAUDID) injection 0.5 mg  0.5 mg Intravenous Q3H PRN Florene Glen, MD   0.5 mg at 08/18/2016 1608  . ketorolac (TORADOL) 15 MG/ML injection 15 mg  15 mg Intravenous Q8H Ardis Hughs, MD   15 mg at 09/01/16 0544  . ondansetron (ZOFRAN) injection 4 mg  4 mg Intravenous Q6H PRN Ardis Hughs, MD      . pantoprazole (PROTONIX) injection 40 mg  40 mg Intravenous Q24H Ardis Hughs, MD   40 mg at 09/01/16 0358  . piperacillin-tazobactam (ZOSYN) IVPB 3.375 g  3.375 g Intravenous Q8H Florene Glen, MD   3.375 g at 09/01/16 [id number]     Objective: Vital signs in last 24 hours: Temp:  [96.8 F (36 C)-98.7 F (37.1 C)] 97.8 F  (36.6 C) (12/19 0800) Pulse Rate:  [43-117] 109 (12/19 0800) Resp:  [11-36] 18 (12/19 0800) BP: (70-120)/(35-69) 98/51 (12/19 0800) SpO2:  [3 %-100 %] 96 % (12/19 0800)  Intake/Output from previous day: 12/18 0701 - 12/19 0700 In: 2185 [P.O.:125; I.V.:1835; IV Piggyback:100] Out: 2530 [Urine:735; Drains:1595; Blood:200] Intake/Output this shift: Total I/O In: 220 [Other:220] Out: -    Physical Exam  Constitutional: She is oriented to person, place, and time and well-developed, well-nourished, and in no distress.  Husband and daughter at bedside  HENT:  Head: Normocephalic.  Cardiovascular:  Mildly tachycardic  Pulmonary/Chest: Effort normal and breath sounds normal. No respiratory distress.  Abdominal: Soft.  Incision c/d/i.  Appropriately tender with deep palpation.  Drain with mildy purulent output, thin blood tinged.    Genitourinary:  Genitourinary Comments: Foley with clear yellow urine  Neurological: She is alert and oriented to person, place, and time.  Skin: Skin is warm and dry.  Psychiatric: Mood and affect normal.    Lab Results:   Recent Labs  09/12/2016 0453 09/01/16 0607  WBC 45.3* 40.4*  HGB 11.3* 8.5*  HCT 34.0* 26.3*  PLT 387 281   BMET  Recent Labs  08/15/2016 0453 09/01/16 0607  NA 139 136  K 3.9 4.7  CL 109 110  CO2 23 21*  GLUCOSE 114* 156*  BUN 19 21*  CREATININE 1.31* 0.94  CALCIUM 7.2* 6.5*   PT/INR  Recent Labs  09/12/2016 1140  LABPROT 17.8*  INR 1.45   ABG  Recent Labs  08/23/2016 0131 08/16/2016 0444  PHART 7.417 7.47*  HCO3 20.4 21.8    Studies/Results: Ct Angio Chest Pe W And/or Wo Contrast  Result Date: 08/30/2016 CLINICAL DATA:  82 year old female with shortness of breath and tachycardia. Hypoxia. Recent surgery. History of colon cancer and recent colonoscopy. EXAM: CT ANGIOGRAPHY CHEST CT ABDOMEN AND PELVIS WITH CONTRAST TECHNIQUE: Multidetector CT imaging of the chest was performed using the standard protocol  during bolus administration of intravenous contrast. Multiplanar CT image reconstructions and MIPs were obtained to evaluate the vascular anatomy. Multidetector CT imaging of the abdomen and pelvis was performed using the standard protocol during bolus administration of intravenous contrast. CONTRAST:  75 cc Isovue 370 COMPARISON:  Chest radiograph dated 08/20/2016 abdominal radiograph dated 08/24/2016. Comparison is also made CT CT of the abdomen pelvis dated 07/31/2016 FINDINGS: CTA CHEST FINDINGS Cardiovascular: There is no cardiomegaly or pericardial effusion. There is slight displacement of the heart anteriorly and to the left hemithorax secondary to mass effect caused by the large hiatal hernia. There is atherosclerotic calcification of the thoracic aorta and aortic arch. No aneurysmal dilatation or evidence of dissection. The origins of the great vessels of the aortic arch appear patent. No CT evidence of pulmonary embolism. Mediastinum/Nodes: There is no hilar or mediastinal adenopathy. There is free large hiatal hernia containing the majority of the stomach with organo-axial gastric volvulus. There is air distention of the herniated portion of the stomach without definite evidence of gastric outlet obstruction. The esophagus is grossly unremarkable. The thyroid gland is atrophic. No thyroid nodules identified. Lungs/Pleura: There are small bilateral pleural effusions with associated partial compressive consolidation of the lower lobes, new since the CT of 07/31/2016. Pneumonia is not excluded. Clinical correlation and follow-up to resolution is recommended. There is no pneumothorax. The central airways are patent. Musculoskeletal: There is no axillary adenopathy. Mild diffuse subcutaneous edema. The chest wall soft tissues are otherwise unremarkable. The there is osteopenia with degenerative changes of the spine. No acute fracture. Review of the MIP images confirms the above findings. CT ABDOMEN and PELVIS  FINDINGS No intra-abdominal free air. Small ascites. There is a 12 x 12 cm fluid collection within the pelvis which appears loculated with enhancing walls concerning for an infected collection and developing abscess. Hepatobiliary: A 1.6 x 1.9 cm focal nodular enhancement in the inferior aspect of the right lobe of the liver (series 501 image 34 is incompletely characterized but most likely represents a portal venous shunting versus a hemangioma. Other etiologies are not excluded. This appears similar to prior studies. There is no intrahepatic biliary ductal dilatation. The gallbladder is mildly distended. There is small amount of layering sludge in the gallbladder fundus. No pericholecystic fluid. Pancreas: A 1 cm low attenuating/cystic listen arising from the anterior aspect of the head of the pancreas is incompletely characterized but may represent a side branch IPMN or retention cyst. Other etiologies are not excluded. MRI may provide better characterisation. There is no dilatation of the main pancreatic duct or associated gland atrophy. Spleen: Normal in size without focal abnormality. Adrenals/Urinary Tract: The adrenal glands, kidneys, visualized ureters appear unremarkable. The urinary bladder is decompressed around a Foley catheter. Stomach/Bowel: There is postsurgical changes of right hemicolectomy with ileotransverse anastomosis in the mid abdomen. Multiple mildly dilated and thickened  loops of small bowel throughout the abdomen most compatible with a degree of ileus. Superimposed enteritis is not excluded. Clinical correlation is recommended. There is abutment of bowel loops to the anterior peritoneal wall compatible with adhesions. There is extensive sigmoid diverticulosis with muscular hypertrophy. No active inflammatory changes. Evaluation of the bowel is limited in the absence of oral contrast. Vascular/Lymphatic: There is moderate aortoiliac atherosclerotic disease. The origins of the celiac axis,  SMA, IMA as well as the origins of the renal arteries are patent. The SMV, splenic vein, and main portal vein are patent. No portal venous gas identified. There is no adenopathy. Reproductive: Hysterectomy. Other: There is diffuse mesenteric edema as well as diffuse subcutaneous edema and anasarca. Musculoskeletal: Multilevel degenerative changes of spine. Osteopenia. No acute fracture. IMPRESSION: Interval development of small bilateral pleural effusions with associated partial consolidative changes of the lung bases which may represent atelectasis versus infiltrate. Clinical correlation and follow-up to resolution recommended. No CT evidence of aortic dissection or pulmonary embolism. Large hiatal hernia containing the majority of the stomach. Postsurgical changes of right hemicolectomy with ileotransverse anastomosis. Mildly distended thickened loops of small bowel most compatible with an ileus. Enteritis is not excluded. Clinical correlation is recommended. No evidence of bowel obstruction. Sigmoid diverticulosis. Small ascites, diffuse mesenteric edema and anasarca. Loculated appearing fluid within the pelvis with enhancing wall concerning for an infected collection or developing abscess. Clinical correlation is recommended. Electronically Signed   By: Anner Crete M.D.   On: 08/30/2016 22:02   Ct Abdomen Pelvis W Contrast  Result Date: 08/30/2016 CLINICAL DATA:  82 year old female with shortness of breath and tachycardia. Hypoxia. Recent surgery. History of colon cancer and recent colonoscopy. EXAM: CT ANGIOGRAPHY CHEST CT ABDOMEN AND PELVIS WITH CONTRAST TECHNIQUE: Multidetector CT imaging of the chest was performed using the standard protocol during bolus administration of intravenous contrast. Multiplanar CT image reconstructions and MIPs were obtained to evaluate the vascular anatomy. Multidetector CT imaging of the abdomen and pelvis was performed using the standard protocol during bolus  administration of intravenous contrast. CONTRAST:  75 cc Isovue 370 COMPARISON:  Chest radiograph dated 08/20/2016 abdominal radiograph dated 08/24/2016. Comparison is also made CT CT of the abdomen pelvis dated 07/31/2016 FINDINGS: CTA CHEST FINDINGS Cardiovascular: There is no cardiomegaly or pericardial effusion. There is slight displacement of the heart anteriorly and to the left hemithorax secondary to mass effect caused by the large hiatal hernia. There is atherosclerotic calcification of the thoracic aorta and aortic arch. No aneurysmal dilatation or evidence of dissection. The origins of the great vessels of the aortic arch appear patent. No CT evidence of pulmonary embolism. Mediastinum/Nodes: There is no hilar or mediastinal adenopathy. There is free large hiatal hernia containing the majority of the stomach with organo-axial gastric volvulus. There is air distention of the herniated portion of the stomach without definite evidence of gastric outlet obstruction. The esophagus is grossly unremarkable. The thyroid gland is atrophic. No thyroid nodules identified. Lungs/Pleura: There are small bilateral pleural effusions with associated partial compressive consolidation of the lower lobes, new since the CT of 07/31/2016. Pneumonia is not excluded. Clinical correlation and follow-up to resolution is recommended. There is no pneumothorax. The central airways are patent. Musculoskeletal: There is no axillary adenopathy. Mild diffuse subcutaneous edema. The chest wall soft tissues are otherwise unremarkable. The there is osteopenia with degenerative changes of the spine. No acute fracture. Review of the MIP images confirms the above findings. CT ABDOMEN and PELVIS FINDINGS No intra-abdominal free air.  Small ascites. There is a 12 x 12 cm fluid collection within the pelvis which appears loculated with enhancing walls concerning for an infected collection and developing abscess. Hepatobiliary: A 1.6 x 1.9 cm focal  nodular enhancement in the inferior aspect of the right lobe of the liver (series 501 image 34 is incompletely characterized but most likely represents a portal venous shunting versus a hemangioma. Other etiologies are not excluded. This appears similar to prior studies. There is no intrahepatic biliary ductal dilatation. The gallbladder is mildly distended. There is small amount of layering sludge in the gallbladder fundus. No pericholecystic fluid. Pancreas: A 1 cm low attenuating/cystic listen arising from the anterior aspect of the head of the pancreas is incompletely characterized but may represent a side branch IPMN or retention cyst. Other etiologies are not excluded. MRI may provide better characterisation. There is no dilatation of the main pancreatic duct or associated gland atrophy. Spleen: Normal in size without focal abnormality. Adrenals/Urinary Tract: The adrenal glands, kidneys, visualized ureters appear unremarkable. The urinary bladder is decompressed around a Foley catheter. Stomach/Bowel: There is postsurgical changes of right hemicolectomy with ileotransverse anastomosis in the mid abdomen. Multiple mildly dilated and thickened loops of small bowel throughout the abdomen most compatible with a degree of ileus. Superimposed enteritis is not excluded. Clinical correlation is recommended. There is abutment of bowel loops to the anterior peritoneal wall compatible with adhesions. There is extensive sigmoid diverticulosis with muscular hypertrophy. No active inflammatory changes. Evaluation of the bowel is limited in the absence of oral contrast. Vascular/Lymphatic: There is moderate aortoiliac atherosclerotic disease. The origins of the celiac axis, SMA, IMA as well as the origins of the renal arteries are patent. The SMV, splenic vein, and main portal vein are patent. No portal venous gas identified. There is no adenopathy. Reproductive: Hysterectomy. Other: There is diffuse mesenteric edema as  well as diffuse subcutaneous edema and anasarca. Musculoskeletal: Multilevel degenerative changes of spine. Osteopenia. No acute fracture. IMPRESSION: Interval development of small bilateral pleural effusions with associated partial consolidative changes of the lung bases which may represent atelectasis versus infiltrate. Clinical correlation and follow-up to resolution recommended. No CT evidence of aortic dissection or pulmonary embolism. Large hiatal hernia containing the majority of the stomach. Postsurgical changes of right hemicolectomy with ileotransverse anastomosis. Mildly distended thickened loops of small bowel most compatible with an ileus. Enteritis is not excluded. Clinical correlation is recommended. No evidence of bowel obstruction. Sigmoid diverticulosis. Small ascites, diffuse mesenteric edema and anasarca. Loculated appearing fluid within the pelvis with enhancing wall concerning for an infected collection or developing abscess. Clinical correlation is recommended. Electronically Signed   By: Anner Crete M.D.   On: 08/30/2016 22:02   Dg Cystogram  Result Date: 09/01/2016 CLINICAL DATA:  Pelvic urinoma. EXAM: CYSTOGRAM COMPARISON:  CT 08/30/2016 and 08/30/2016. FINDINGS: Multiple C-arm images are provided. Initial image labeled left ureter shows no abnormal pattern they could be consistent with extravasation. Subsequent images show contrast within both renal collecting systems. Normal appearing right ureter is demonstrated. Contrast filled bladder with Foley catheter balloon is demonstrated, though the left side is not included on the images. IMPRESSION: Multiple C-arm images appear to show extravasation at the distal left ureter. Electronically Signed   By: Nelson Chimes M.D.   On: 09/01/2016 06:36   Dg C-arm 1-60 Min  Result Date: 09/01/2016 CLINICAL DATA:  Pelvic urinoma. EXAM: CYSTOGRAM COMPARISON:  CT 08/24/2016 and 08/30/2016. FINDINGS: Multiple C-arm images are provided.  Initial image labeled left ureter  shows no abnormal pattern they could be consistent with extravasation. Subsequent images show contrast within both renal collecting systems. Normal appearing right ureter is demonstrated. Contrast filled bladder with Foley catheter balloon is demonstrated, though the left side is not included on the images. IMPRESSION: Multiple C-arm images appear to show extravasation at the distal left ureter. Electronically Signed   By: Nelson Chimes M.D.   On: 09/01/2016 06:36   Ct Image Guided Drainage Percut Cath  Peritoneal Retroperit  Result Date: 08/26/2016 INDICATION: 82 year old female with colon cancer and recent right colon resection and hysterectomy. Patient presents with abdominal pain, elevated white count and large pelvic fluid collection on CT imaging. Concern for a large pelvic abscess collection. EXAM: CT-GUIDED DRAINAGE OF PELVIC FLUID COLLECTION MEDICATIONS: The patient is currently admitted to the hospital and receiving intravenous antibiotics. The antibiotics were administered within an appropriate time frame prior to the initiation of the procedure. ANESTHESIA/SEDATION: Fentanyl 0.5 mcg IV; Versed 12.5 mg IV Moderate Sedation Time:  20 minutes The patient was continuously monitored during the procedure by the interventional radiology nurse under my direct supervision. COMPLICATIONS: None immediate. PROCEDURE: Informed written consent was obtained from the patient after a thorough discussion of the procedural risks, benefits and alternatives. All questions were addressed. Maximal Sterile Barrier Technique was utilized including caps, mask, sterile gowns, sterile gloves, sterile drape, hand hygiene and skin antiseptic. A timeout was performed prior to the initiation of the procedure. Patient was placed prone on the CT scanner and images of the pelvis were obtained. Additional images of the abdomen were obtained as well. The right gluteal region was prepped and draped in a  sterile fashion. Skin was prepped with chlorhexidine. Skin and soft tissues were anesthetized with 1% lidocaine. An 18 gauge needle was directed into the large pelvic fluid collection from a right transgluteal approach with CT guidance. Clear yellow fluid was aspirated. Stiff Amplatz wire was advanced into the collection. The tract was dilated to accommodate a 10.2 [place] multipurpose drain. Clear yellow fluid was aspirated and compatible with urine. Catheter was sutured to the skin. A sample sent for culture. Bandage placed over the transgluteal drain. FINDINGS: The initial CT images demonstrate high-density contrast throughout the pelvic fluid collection. Additional images of the abdomen confirm that the only high-density contrast was associated with the urinary bladder. This fluid is compatible with urine. The urinary bladder is decompressed with a Foley catheter. 18 gauge needle was directed into the pelvic fluid collection from a right transgluteal approach. Drain placement confirmed within the large pelvic fluid collection. Clear yellow fluid/urine was aspirated from the collection and attached to gravity bag. IMPRESSION: The large pelvic fluid collection contains high-density contrast and findings are compatible with a urine leak. Based on the history of recent hysterectomy and colon resection, suspect there is an injury to a ureter or possibly the urinary bladder. Recommend urology consultation. Successful placement of a CT-guided transgluteal pelvic drain. Electronically Signed   By: Markus Daft M.D.   On: 09/12/2016 16:18     Assessment: s/p Procedure(s): CYSTOSCOPY WITH bilateral RETROGRADE PYELOGRAM, diagnostic URETEROSCOPY, exploratory laparotomy with reimplantation/repair of left ureter EXPLORATORY LAPAROTOMY POD 1  Plan: -maintain Foley x 7 days -ureteral stent to remain 4 weeks -monitor drain output, anticipate high output today in setting of surgery, peritonitis, pelvic  abcess -supportive care -continue IV abx, f/u culture data and adjust as needed -daily labs    LOS: 1 day    Hollice Espy 09/01/2016

## 2016-09-01 NOTE — Transfer of Care (Signed)
Immediate Anesthesia Transfer of Care Note  Patient: Katherine Medina  Procedure(s) Performed: Procedure(s): CYSTOSCOPY WITH bilateral RETROGRADE PYELOGRAM, diagnostic URETEROSCOPY, exploratory laparotomy with reimplantation/repair of left ureter (Bilateral) EXPLORATORY LAPAROTOMY (N/A)  Patient Location: PACU  Anesthesia Type:General  Level of Consciousness: sedated  Airway & Oxygen Therapy: Patient Spontanous Breathing and Patient connected to face mask oxygen  Post-op Assessment: Report given to RN and Post -op Vital signs reviewed and stable  Post vital signs: Reviewed and stable  Last Vitals:  Vitals:   09/08/2016 2128 09/08/2016 2130  BP: (!) 89/56 (!) 89/56  Pulse: 93 97  Resp: 16 (!) 21  Temp:      Last Pain:  Vitals:   08/19/2016 1900  TempSrc: Oral  PainSc:          Complications: No apparent anesthesia complications

## 2016-09-01 NOTE — Progress Notes (Signed)
1 Day Post-Op   Subjective:  Patient reports feeling better this morning. Continues to have some abdominal discomfort but states nothing like what she had before coming to the hospital.  Vital signs in last 24 hours: Temp:  [96.8 F (36 C)-98.7 F (37.1 C)] 97.8 F (36.6 C) (12/19 0800) Pulse Rate:  [43-117] 109 (12/19 0800) Resp:  [11-36] 18 (12/19 0800) BP: (70-120)/(35-69) 98/51 (12/19 0800) SpO2:  [3 %-100 %] 96 % (12/19 0800) Last BM Date: 08/30/16  Intake/Output from previous day: 12/18 0701 - 12/19 0700 In: 2185 [P.O.:125; I.V.:1835; IV Piggyback:100] Out: 2530 [Urine:735; Drains:1595; Blood:200]  GI: Abdomen soft, appropriately tender to palpation, mildly distended. Dressings are clean, dry, intact to the midline. Visualized drains with comminution of urine and serous fluid.  Lab Results:  CBC  Recent Labs  08/27/2016 0453 09/01/16 0607  WBC 45.3* 40.4*  HGB 11.3* 8.5*  HCT 34.0* 26.3*  PLT 387 281   CMP     Component Value Date/Time   NA 136 09/01/2016 0607   K 4.7 09/01/2016 0607   CL 110 09/01/2016 0607   CO2 21 (L) 09/01/2016 0607   GLUCOSE 156 (H) 09/01/2016 0607   BUN 21 (H) 09/01/2016 0607   CREATININE 0.94 09/01/2016 0607   CALCIUM 6.5 (L) 09/01/2016 0607   PROT 6.2 (L) 08/30/2016 1923   ALBUMIN 2.4 (L) 08/30/2016 1923   AST 29 08/30/2016 1923   ALT 14 08/30/2016 1923   ALKPHOS 75 08/30/2016 1923   BILITOT 1.2 08/30/2016 1923   GFRNONAA 54 (L) 09/01/2016 0607   GFRAA >60 09/01/2016 0607   PT/INR  Recent Labs  08/25/2016 1140  LABPROT 17.8*  INR 1.45    Studies/Results: Ct Angio Chest Pe W And/or Wo Contrast  Result Date: 08/30/2016 CLINICAL DATA:  82 year old female with shortness of breath and tachycardia. Hypoxia. Recent surgery. History of colon cancer and recent colonoscopy. EXAM: CT ANGIOGRAPHY CHEST CT ABDOMEN AND PELVIS WITH CONTRAST TECHNIQUE: Multidetector CT imaging of the chest was performed using the standard protocol during  bolus administration of intravenous contrast. Multiplanar CT image reconstructions and MIPs were obtained to evaluate the vascular anatomy. Multidetector CT imaging of the abdomen and pelvis was performed using the standard protocol during bolus administration of intravenous contrast. CONTRAST:  75 cc Isovue 370 COMPARISON:  Chest radiograph dated 08/20/2016 abdominal radiograph dated 08/24/2016. Comparison is also made CT CT of the abdomen pelvis dated 07/31/2016 FINDINGS: CTA CHEST FINDINGS Cardiovascular: There is no cardiomegaly or pericardial effusion. There is slight displacement of the heart anteriorly and to the left hemithorax secondary to mass effect caused by the large hiatal hernia. There is atherosclerotic calcification of the thoracic aorta and aortic arch. No aneurysmal dilatation or evidence of dissection. The origins of the great vessels of the aortic arch appear patent. No CT evidence of pulmonary embolism. Mediastinum/Nodes: There is no hilar or mediastinal adenopathy. There is free large hiatal hernia containing the majority of the stomach with organo-axial gastric volvulus. There is air distention of the herniated portion of the stomach without definite evidence of gastric outlet obstruction. The esophagus is grossly unremarkable. The thyroid gland is atrophic. No thyroid nodules identified. Lungs/Pleura: There are small bilateral pleural effusions with associated partial compressive consolidation of the lower lobes, new since the CT of 07/31/2016. Pneumonia is not excluded. Clinical correlation and follow-up to resolution is recommended. There is no pneumothorax. The central airways are patent. Musculoskeletal: There is no axillary adenopathy. Mild diffuse subcutaneous edema. The chest wall  soft tissues are otherwise unremarkable. The there is osteopenia with degenerative changes of the spine. No acute fracture. Review of the MIP images confirms the above findings. CT ABDOMEN and PELVIS  FINDINGS No intra-abdominal free air. Small ascites. There is a 12 x 12 cm fluid collection within the pelvis which appears loculated with enhancing walls concerning for an infected collection and developing abscess. Hepatobiliary: A 1.6 x 1.9 cm focal nodular enhancement in the inferior aspect of the right lobe of the liver (series 501 image 34 is incompletely characterized but most likely represents a portal venous shunting versus a hemangioma. Other etiologies are not excluded. This appears similar to prior studies. There is no intrahepatic biliary ductal dilatation. The gallbladder is mildly distended. There is small amount of layering sludge in the gallbladder fundus. No pericholecystic fluid. Pancreas: A 1 cm low attenuating/cystic listen arising from the anterior aspect of the head of the pancreas is incompletely characterized but may represent a side branch IPMN or retention cyst. Other etiologies are not excluded. MRI may provide better characterisation. There is no dilatation of the main pancreatic duct or associated gland atrophy. Spleen: Normal in size without focal abnormality. Adrenals/Urinary Tract: The adrenal glands, kidneys, visualized ureters appear unremarkable. The urinary bladder is decompressed around a Foley catheter. Stomach/Bowel: There is postsurgical changes of right hemicolectomy with ileotransverse anastomosis in the mid abdomen. Multiple mildly dilated and thickened loops of small bowel throughout the abdomen most compatible with a degree of ileus. Superimposed enteritis is not excluded. Clinical correlation is recommended. There is abutment of bowel loops to the anterior peritoneal wall compatible with adhesions. There is extensive sigmoid diverticulosis with muscular hypertrophy. No active inflammatory changes. Evaluation of the bowel is limited in the absence of oral contrast. Vascular/Lymphatic: There is moderate aortoiliac atherosclerotic disease. The origins of the celiac axis,  SMA, IMA as well as the origins of the renal arteries are patent. The SMV, splenic vein, and main portal vein are patent. No portal venous gas identified. There is no adenopathy. Reproductive: Hysterectomy. Other: There is diffuse mesenteric edema as well as diffuse subcutaneous edema and anasarca. Musculoskeletal: Multilevel degenerative changes of spine. Osteopenia. No acute fracture. IMPRESSION: Interval development of small bilateral pleural effusions with associated partial consolidative changes of the lung bases which may represent atelectasis versus infiltrate. Clinical correlation and follow-up to resolution recommended. No CT evidence of aortic dissection or pulmonary embolism. Large hiatal hernia containing the majority of the stomach. Postsurgical changes of right hemicolectomy with ileotransverse anastomosis. Mildly distended thickened loops of small bowel most compatible with an ileus. Enteritis is not excluded. Clinical correlation is recommended. No evidence of bowel obstruction. Sigmoid diverticulosis. Small ascites, diffuse mesenteric edema and anasarca. Loculated appearing fluid within the pelvis with enhancing wall concerning for an infected collection or developing abscess. Clinical correlation is recommended. Electronically Signed   By: Anner Crete M.D.   On: 08/30/2016 22:02   Ct Abdomen Pelvis W Contrast  Result Date: 08/30/2016 CLINICAL DATA:  82 year old female with shortness of breath and tachycardia. Hypoxia. Recent surgery. History of colon cancer and recent colonoscopy. EXAM: CT ANGIOGRAPHY CHEST CT ABDOMEN AND PELVIS WITH CONTRAST TECHNIQUE: Multidetector CT imaging of the chest was performed using the standard protocol during bolus administration of intravenous contrast. Multiplanar CT image reconstructions and MIPs were obtained to evaluate the vascular anatomy. Multidetector CT imaging of the abdomen and pelvis was performed using the standard protocol during bolus  administration of intravenous contrast. CONTRAST:  75 cc Isovue 370 COMPARISON:  Chest radiograph dated 08/20/2016 abdominal radiograph dated 08/24/2016. Comparison is also made CT CT of the abdomen pelvis dated 07/31/2016 FINDINGS: CTA CHEST FINDINGS Cardiovascular: There is no cardiomegaly or pericardial effusion. There is slight displacement of the heart anteriorly and to the left hemithorax secondary to mass effect caused by the large hiatal hernia. There is atherosclerotic calcification of the thoracic aorta and aortic arch. No aneurysmal dilatation or evidence of dissection. The origins of the great vessels of the aortic arch appear patent. No CT evidence of pulmonary embolism. Mediastinum/Nodes: There is no hilar or mediastinal adenopathy. There is free large hiatal hernia containing the majority of the stomach with organo-axial gastric volvulus. There is air distention of the herniated portion of the stomach without definite evidence of gastric outlet obstruction. The esophagus is grossly unremarkable. The thyroid gland is atrophic. No thyroid nodules identified. Lungs/Pleura: There are small bilateral pleural effusions with associated partial compressive consolidation of the lower lobes, new since the CT of 07/31/2016. Pneumonia is not excluded. Clinical correlation and follow-up to resolution is recommended. There is no pneumothorax. The central airways are patent. Musculoskeletal: There is no axillary adenopathy. Mild diffuse subcutaneous edema. The chest wall soft tissues are otherwise unremarkable. The there is osteopenia with degenerative changes of the spine. No acute fracture. Review of the MIP images confirms the above findings. CT ABDOMEN and PELVIS FINDINGS No intra-abdominal free air. Small ascites. There is a 12 x 12 cm fluid collection within the pelvis which appears loculated with enhancing walls concerning for an infected collection and developing abscess. Hepatobiliary: A 1.6 x 1.9 cm focal  nodular enhancement in the inferior aspect of the right lobe of the liver (series 501 image 34 is incompletely characterized but most likely represents a portal venous shunting versus a hemangioma. Other etiologies are not excluded. This appears similar to prior studies. There is no intrahepatic biliary ductal dilatation. The gallbladder is mildly distended. There is small amount of layering sludge in the gallbladder fundus. No pericholecystic fluid. Pancreas: A 1 cm low attenuating/cystic listen arising from the anterior aspect of the head of the pancreas is incompletely characterized but may represent a side branch IPMN or retention cyst. Other etiologies are not excluded. MRI may provide better characterisation. There is no dilatation of the main pancreatic duct or associated gland atrophy. Spleen: Normal in size without focal abnormality. Adrenals/Urinary Tract: The adrenal glands, kidneys, visualized ureters appear unremarkable. The urinary bladder is decompressed around a Foley catheter. Stomach/Bowel: There is postsurgical changes of right hemicolectomy with ileotransverse anastomosis in the mid abdomen. Multiple mildly dilated and thickened loops of small bowel throughout the abdomen most compatible with a degree of ileus. Superimposed enteritis is not excluded. Clinical correlation is recommended. There is abutment of bowel loops to the anterior peritoneal wall compatible with adhesions. There is extensive sigmoid diverticulosis with muscular hypertrophy. No active inflammatory changes. Evaluation of the bowel is limited in the absence of oral contrast. Vascular/Lymphatic: There is moderate aortoiliac atherosclerotic disease. The origins of the celiac axis, SMA, IMA as well as the origins of the renal arteries are patent. The SMV, splenic vein, and main portal vein are patent. No portal venous gas identified. There is no adenopathy. Reproductive: Hysterectomy. Other: There is diffuse mesenteric edema as  well as diffuse subcutaneous edema and anasarca. Musculoskeletal: Multilevel degenerative changes of spine. Osteopenia. No acute fracture. IMPRESSION: Interval development of small bilateral pleural effusions with associated partial consolidative changes of the lung bases which may represent atelectasis versus infiltrate. Clinical  correlation and follow-up to resolution recommended. No CT evidence of aortic dissection or pulmonary embolism. Large hiatal hernia containing the majority of the stomach. Postsurgical changes of right hemicolectomy with ileotransverse anastomosis. Mildly distended thickened loops of small bowel most compatible with an ileus. Enteritis is not excluded. Clinical correlation is recommended. No evidence of bowel obstruction. Sigmoid diverticulosis. Small ascites, diffuse mesenteric edema and anasarca. Loculated appearing fluid within the pelvis with enhancing wall concerning for an infected collection or developing abscess. Clinical correlation is recommended. Electronically Signed   By: Anner Crete M.D.   On: 08/30/2016 22:02   Dg Cystogram  Result Date: 09/01/2016 CLINICAL DATA:  Pelvic urinoma. EXAM: CYSTOGRAM COMPARISON:  CT 08/20/2016 and 08/30/2016. FINDINGS: Multiple C-arm images are provided. Initial image labeled left ureter shows no abnormal pattern they could be consistent with extravasation. Subsequent images show contrast within both renal collecting systems. Normal appearing right ureter is demonstrated. Contrast filled bladder with Foley catheter balloon is demonstrated, though the left side is not included on the images. IMPRESSION: Multiple C-arm images appear to show extravasation at the distal left ureter. Electronically Signed   By: Nelson Chimes M.D.   On: 09/01/2016 06:36   Dg C-arm 1-60 Min  Result Date: 09/01/2016 CLINICAL DATA:  Pelvic urinoma. EXAM: CYSTOGRAM COMPARISON:  CT 08/17/2016 and 08/30/2016. FINDINGS: Multiple C-arm images are provided.  Initial image labeled left ureter shows no abnormal pattern they could be consistent with extravasation. Subsequent images show contrast within both renal collecting systems. Normal appearing right ureter is demonstrated. Contrast filled bladder with Foley catheter balloon is demonstrated, though the left side is not included on the images. IMPRESSION: Multiple C-arm images appear to show extravasation at the distal left ureter. Electronically Signed   By: Nelson Chimes M.D.   On: 09/01/2016 06:36   Ct Image Guided Drainage Percut Cath  Peritoneal Retroperit  Result Date: 08/28/2016 INDICATION: 82 year old female with colon cancer and recent right colon resection and hysterectomy. Patient presents with abdominal pain, elevated white count and large pelvic fluid collection on CT imaging. Concern for a large pelvic abscess collection. EXAM: CT-GUIDED DRAINAGE OF PELVIC FLUID COLLECTION MEDICATIONS: The patient is currently admitted to the hospital and receiving intravenous antibiotics. The antibiotics were administered within an appropriate time frame prior to the initiation of the procedure. ANESTHESIA/SEDATION: Fentanyl 0.5 mcg IV; Versed 12.5 mg IV Moderate Sedation Time:  20 minutes The patient was continuously monitored during the procedure by the interventional radiology nurse under my direct supervision. COMPLICATIONS: None immediate. PROCEDURE: Informed written consent was obtained from the patient after a thorough discussion of the procedural risks, benefits and alternatives. All questions were addressed. Maximal Sterile Barrier Technique was utilized including caps, mask, sterile gowns, sterile gloves, sterile drape, hand hygiene and skin antiseptic. A timeout was performed prior to the initiation of the procedure. Patient was placed prone on the CT scanner and images of the pelvis were obtained. Additional images of the abdomen were obtained as well. The right gluteal region was prepped and draped in a  sterile fashion. Skin was prepped with chlorhexidine. Skin and soft tissues were anesthetized with 1% lidocaine. An 18 gauge needle was directed into the large pelvic fluid collection from a right transgluteal approach with CT guidance. Clear yellow fluid was aspirated. Stiff Amplatz wire was advanced into the collection. The tract was dilated to accommodate a 10.2 [place] multipurpose drain. Clear yellow fluid was aspirated and compatible with urine. Catheter was sutured to the skin. A sample sent  for culture. Bandage placed over the transgluteal drain. FINDINGS: The initial CT images demonstrate high-density contrast throughout the pelvic fluid collection. Additional images of the abdomen confirm that the only high-density contrast was associated with the urinary bladder. This fluid is compatible with urine. The urinary bladder is decompressed with a Foley catheter. 18 gauge needle was directed into the pelvic fluid collection from a right transgluteal approach. Drain placement confirmed within the large pelvic fluid collection. Clear yellow fluid/urine was aspirated from the collection and attached to gravity bag. IMPRESSION: The large pelvic fluid collection contains high-density contrast and findings are compatible with a urine leak. Based on the history of recent hysterectomy and colon resection, suspect there is an injury to a ureter or possibly the urinary bladder. Recommend urology consultation. Successful placement of a CT-guided transgluteal pelvic drain. Electronically Signed   By: Markus Daft M.D.   On: 08/28/2016 16:18    Assessment/Plan: 82 year old female status post repair of left ureteral injury by urology overnight. Appears to be doing very well. Continues to have elevated white blood cell count. Plan to continue resuscitation, care and the step down unit status today, antibiotics, incentive spirometer. Appreciate urology and OB/GYN assistance with this complex patient.   Clayburn Pert, MD  FACS General Surgeon  09/01/2016

## 2016-09-01 NOTE — Progress Notes (Signed)
I was informed of the pelvic collection and urinoma by Dr Billie Lade this evening and spoke to Dr Louis Meckel from urology, who has been keeping me abreast of Katherine Boulton post-op complication    I have recalled the surgical steps and findings with Dr Louis Meckel to hopefully aide in identifying the injury that has led to the development of the urinoma and subsequent surgery.   I offered to come in to assist Dr Louis Meckel with the surgery, however Dr Dahlia Byes was gracious to assist since he was on-call.  I will be following this case throughout the evening and see Katherine Medina tomorrow morning     I am eager to learn the location of the injury and delineate its etiology    ----- Larey Days, MD Attending Obstetrician and Gynecologist [facility], Department of [facility]

## 2016-09-01 NOTE — Anesthesia Postprocedure Evaluation (Signed)
Anesthesia Post Note  Patient: Katherine Medina  Procedure(s) Performed: Procedure(s) (LRB): CYSTOSCOPY WITH bilateral RETROGRADE PYELOGRAM, diagnostic URETEROSCOPY, exploratory laparotomy with reimplantation/repair of left ureter (Bilateral) EXPLORATORY LAPAROTOMY (N/A)  Patient location during evaluation: ICU Anesthesia Type: General Level of consciousness: awake and alert Pain management: pain level controlled Vital Signs Assessment: post-procedure vital signs reviewed and stable Respiratory status: spontaneous breathing, nonlabored ventilation, respiratory function stable and patient connected to nasal cannula oxygen Cardiovascular status: blood pressure returned to baseline and stable Postop Assessment: no signs of nausea or vomiting Anesthetic complications: no     Last Vitals:  Vitals:   09/01/16 0600 09/01/16 0700  BP: (!) 99/45 (!) 70/57  Pulse: (!) 111 (!) 109  Resp: (!) 29 (!) 22  Temp:      Last Pain:  Vitals:   09/01/16 0500  TempSrc: Oral  PainSc:                  Alison Stalling

## 2016-09-01 NOTE — Progress Notes (Signed)
[place] met husband of the Pt in ICU Rm 13, whom CH had seen before. Pt was resting and only waved to Ch. CH encouraged husband and promised to keep his wife in prayers. Daughter was at Pt's bedside.    09/01/16 1300  Clinical Encounter Type  Visited With Patient;Patient and family together  Visit Type Initial;Spiritual support  Referral From Family  Consult/Referral To Chaplain  Spiritual Encounters  Spiritual Needs Prayer

## 2016-09-01 NOTE — Op Note (Addendum)
Preoperative diagnosis:  1. Urinary tract leak  Postoperative diagnosis:  1. Transected left Distal ureter  Procedure: 1. Cystogram with interpretation 2. Cystoscopy, bilateral retrograde pyelograms with interpretation 3. Left diagnostic ureteroscopy 4. Open left ureteral reimplant with stent placement  Surgeon: Ardis Hughs, MD 1st Assistant: Dr. Caroleen Hamman,  MD  Anesthesia: General  Complications: None  Intraoperative findings:  #1:400 mL of Conray contrast mixed with normal saline was injected into the patient's bladder under fluoroscopy with no significant extravasation noted. The contours of the bladder or sharp.   #2: Cystoscopy demonstrated mucosal irritation from the patient's Foley catheter but no significant bladder abnormalities. The ureteral orifices were orthotopic. There was urine effluxing from the patient's right ureteral orifice. There was no notable urine effluxing from the patient's left ureter. #3: Approximately 10 mL of contrast was instilled into the patient's right ureteral orifice and retrograde pyelogram was performed demonstrating a normal caliber ureter up to the kidney with what appeared to be a next to renal pelvis but no significant a Francis. There was sharp calyces and no filling defects.   #4: Approximately 5 mL was instilled into the patient's left distal ureteral orifice noting extravasation of contrast almost immediately beyond the transmural ureter. #5: Semirigid ureteroscopy demonstrated a blind ended being transmural ureter opening up into the extraperitoneal space, the previously placed pigtail drain was directly visualized by the scope. I was unable to find the transected more proximal ureter.   #6: Upon exploration the left distal ureter was noted on the sidewall of the left pelvis. It was mobilized proximally and then reimplanted into the left bladder dome . #7: A 24 cm times 6 French double-J ureteral stent was placed in a retrograde  fashion prior to completion of the ureteral neocystostomy. EBL: Minimal  Specimens: None  Indication: Katherine Medina is a 82 y.o. patient with  a notable pelvic fluid collection after undergoing a hysterectomy and right hemicolectomy. A drain was placed in the collection and was notable for urine. We are consults and upon this finding, and we discussed her treatment options.  AFter reviewing the management options for treatment, he elected to proceed with the above surgical procedure(s). We have discussed the potential benefits and risks of the procedure, side effects of the proposed treatment, the likelihood of the patient achieving the goals of the procedure, and any potential problems that might occur during the procedure or recuperation. Informed consent has been obtained.  Description of procedure:  The patient was taken to the operating room and general anesthesia was induced.  The patient was placed in the dorsal lithotomy position, prepped and draped in the usual sterile fashion, and preoperative antibiotics were administered. A preoperative time-out was performed.    a 91 French Foley catheter was placed and the patient urethra and into the bladder. The bladder was drained and then approximate 400 mL of a mix of Conray and normal saline was instilled as the patient's bladder taking fluoroscopic and images throughout. The above findings were noted.  I then inserted a 21 French 30 cystoscope and performed cystoscopy with the above findings. I then using a 6 [place] open-ended ureteral catheter performed a right retrograde pyelogram with the above findings. This process was then repeated on the left with the above findings. I then used a 6/4 [place] semirigid ureteroscope and performed left ureteroscopy with the above findings.  The patient was taken out of dorsal lithotomy position and placed in the supine position. Her abdomen was then prepped  and draped in the routine sterile fashion. While  the patient was being repositioned, I went and spoke to the patient's daughter to discuss the findings and our intended treatment plan. The daughter consented to proceed with a left distal ureteral reimplant.  Using a 10 blade I made a Pfannenstiel incision and Carried it down to the fascia. We then opened the fascia with catery. Once through the fascia the muscle was split medially. Using a combination of blunt dissection and cautery the extravesical space was opened. The epigastric vessels were encountered and tied off with 2-0 silk ties. A Bookwalter retractor was placed and adequate exposure obtained. The ureter was then identified on the left pelvic sidewall. It was then mobilized more proximally for approximately 5 inches. I then mobilized the bladder on the contralateral side allowing adequate mobility so that I could perform a tension-free neoureterocystotomy.  I then transected the distal ureter proximally 1 cm proximal to the transected and an spatulated the ureter for approximately 15 mm. I then used 2-0 silk sutures as holding stitches in the bladder dome and made approximately of 1.5 centimeter cystotomy after filling the bladder up with approximate 400 mL of normal saline. I then placed 2 4-0 Vicryl sutures in the crotch of the spatulated ureter and so these to the corner of the cystotomy. I then ran the suture up either side meeting at the top and tying them together and a watertight anastomotic fashion. Prior to placing the second line of suture a 24 cm times 6 French double-J ureteral stent was placed in the retrograde fashion over a 0.038 sensor wire. The curl of the distal end of the stent was placed into the bladder and the neoureterocystotomy completed. I then closed the detrusor muscle over the anastomosis and a second layer using a 3-0 Vicryl in a running fashion. I then closed the perivesical fat around the anastomosis as well as. We then placed a 15 [place] Blake drain into the pelvis  around the neoureterocystotomy. The pelvis was then copiously irrigated. The fascial layer was then closed with a 0 PDS in a running fashion. 100 mL's of Exparel was then used into and around the incision. The skin was then closed with skin staples. The patient was subsequently extubated and returned to the PACU in stable condition.   min Ardis Hughs, M.D.

## 2016-09-01 NOTE — Progress Notes (Addendum)
Obstetric and Gynecology  POD/HD # 1, ICU day 2  Subjective  She is awake and alert, with her Daughter Joellen Jersey at the bedside, eating broth and Jello.  She feels much better than admission, and is laughing and sharing entertaining stories.   She complains of some low abdominal pain but different and improved from what brought her in.   Denies CP, SOB, F/C, N/V/D, or leg pain.   Objective  Objective:   Vitals:   09/01/16 0500 09/01/16 0600 09/01/16 0700 09/01/16 0800  BP: (!) 99/49 (!) 99/45 (!) 70/57 (!) 98/51  Pulse: (!) 107 (!) 111 (!) 109 (!) 109  Resp: 16 (!) 29 (!) 22 18  Temp: 98.4 F (36.9 C)   97.8 F (36.6 C)  TempSrc: Oral   Oral  SpO2: 98% 95% 97% 96%  Weight:      Height:       Temp:  [96.8 F (36 C)-98.7 F (37.1 C)] 97.8 F (36.6 C) (12/19 0800) Pulse Rate:  [43-117] 109 (12/19 0800) Resp:  [11-36] 18 (12/19 0800) BP: (70-120)/(35-69) 98/51 (12/19 0800) SpO2:  [3 %-100 %] 96 % (12/19 0800) I/O last 3 completed shifts: In: 2310 [P.O.:250; I.V.:1835; Other:125; IV Piggyback:100] Out: 2530 [Urine:735; Drains:1595; Blood:200] No intake/output data recorded.  Intake/Output Summary (Last 24 hours) at 09/01/16 0917 Last data filed at 09/01/16 0600  Gross per 24 hour  Intake             2010 ml  Output             2230 ml  Net             -220 ml     Current Vital Signs 24h Vital Sign Ranges  T 97.8 F (36.6 C) Temp  Avg: 98 F (36.7 C)  Min: 96.8 F (36 C)  Max: 98.7 F (37.1 C)  BP (!) 98/51 BP  Min: 70/57  Max: 120/52  HR (!) 109 Pulse  Avg: 101.3  Min: 43  Max: 117  RR 18 Resp  Avg: 22  Min: 11  Max: 36  SaO2 96 % Nasal Cannula SpO2  Avg: 93.4 %  Min: 3 %  Max: 100 %           24 Hour I/O Current Shift I/O  Time Ins Outs 12/18 0701 - 12/19 0700 In: 2185 [P.O.:125; I.V.:1835] Out: 2530 [Urine:735; Drains:1595] No intake/output data recorded.   General: NAD Cardiovascular: RRR, no murmurs Pulmonary: CTAB, normal respiratory effort, [place]  intact Abdomen: Appropriately tender +BS, no guarding. Extremities: No erythema or cords, no calf tenderness, with normal peripheral pulses.  Labs: Results for orders placed or performed during the hospital encounter of 09/06/2016 (from the past 24 hour(s))  Protime-INR     Status: Abnormal   Collection Time: 08/20/2016 11:40 AM  Result Value Ref Range   Prothrombin Time 17.8 (H) 11.4 - 15.2 seconds   INR 1.45   APTT     Status: Abnormal   Collection Time: 08/18/2016 11:40 AM  Result Value Ref Range   aPTT 48 (H) 24 - 36 seconds  Body fluid culture     Status: None (Preliminary result)   Collection Time: 08/29/2016  2:55 PM  Result Value Ref Range   Specimen Description PELVIS    Special Requests NONE    Gram Stain      ABUNDANT WBC PRESENT, PREDOMINANTLY PMN NO ORGANISMS SEEN Performed at [facility]    Culture PENDING  Report Status PENDING   Glucose, capillary     Status: Abnormal   Collection Time: 09/01/16  3:13 AM  Result Value Ref Range   Glucose-Capillary 112 (H) 65 - 99 mg/dL  CBC     Status: Abnormal   Collection Time: 09/01/16  6:07 AM  Result Value Ref Range   WBC 40.4 (H) 3.6 - 11.0 K/uL   RBC 3.15 (L) 3.80 - 5.20 MIL/uL   Hemoglobin 8.5 (L) 12.0 - 16.0 g/dL   HCT 26.3 (L) 35.0 - 47.0 %   MCV 83.3 80.0 - 100.0 fL   MCH 27.1 26.0 - 34.0 pg   MCHC 32.5 32.0 - 36.0 g/dL   RDW 15.2 (H) 11.5 - 14.5 %   Platelets 281 150 - 440 K/uL  Basic metabolic panel     Status: Abnormal   Collection Time: 09/01/16  6:07 AM  Result Value Ref Range   Sodium 136 135 - 145 mmol/L   Potassium 4.7 3.5 - 5.1 mmol/L   Chloride 110 101 - 111 mmol/L   CO2 21 (L) 22 - 32 mmol/L   Glucose, Bld 156 (H) 65 - 99 mg/dL   BUN 21 (H) 6 - 20 mg/dL   Creatinine, Ser 0.94 0.44 - 1.00 mg/dL   Calcium 6.5 (L) 8.9 - 10.3 mg/dL   GFR calc non Af Amer 54 (L) >60 mL/min   GFR calc Af Amer >60 >60 mL/min   Anion gap 5 5 - 15    Cultures: Results for orders placed or performed during the  hospital encounter of 08/24/2016  MRSA PCR Screening     Status: None   Collection Time: 08/28/2016  4:35 AM  Result Value Ref Range Status   MRSA by PCR NEGATIVE NEGATIVE Final    Comment:        The GeneXpert MRSA Assay (FDA approved for NASAL specimens only), is one component of a comprehensive MRSA colonization surveillance program. It is not intended to diagnose MRSA infection nor to guide or monitor treatment for MRSA infections.   Body fluid culture     Status: None (Preliminary result)   Collection Time: 08/23/2016  2:55 PM  Result Value Ref Range Status   Specimen Description PELVIS  Final   Special Requests NONE  Final   Gram Stain   Final    ABUNDANT WBC PRESENT, PREDOMINANTLY PMN NO ORGANISMS SEEN Performed at [facility]    Culture PENDING  Incomplete   Report Status PENDING  Incomplete    Imaging: Ct Angio Chest Pe W And/or Wo Contrast  Result Date: 08/30/2016 CLINICAL DATA:  82 year old female with shortness of breath and tachycardia. Hypoxia. Recent surgery. History of colon cancer and recent colonoscopy. EXAM: CT ANGIOGRAPHY CHEST CT ABDOMEN AND PELVIS WITH CONTRAST TECHNIQUE: Multidetector CT imaging of the chest was performed using the standard protocol during bolus administration of intravenous contrast. Multiplanar CT image reconstructions and MIPs were obtained to evaluate the vascular anatomy. Multidetector CT imaging of the abdomen and pelvis was performed using the standard protocol during bolus administration of intravenous contrast. CONTRAST:  75 cc Isovue 370 COMPARISON:  Chest radiograph dated 08/20/2016 abdominal radiograph dated 08/24/2016. Comparison is also made CT CT of the abdomen pelvis dated 07/31/2016 FINDINGS: CTA CHEST FINDINGS Cardiovascular: There is no cardiomegaly or pericardial effusion. There is slight displacement of the heart anteriorly and to the left hemithorax secondary to mass effect caused by the large hiatal hernia. There is  atherosclerotic calcification of the thoracic aorta  and aortic arch. No aneurysmal dilatation or evidence of dissection. The origins of the great vessels of the aortic arch appear patent. No CT evidence of pulmonary embolism. Mediastinum/Nodes: There is no hilar or mediastinal adenopathy. There is free large hiatal hernia containing the majority of the stomach with organo-axial gastric volvulus. There is air distention of the herniated portion of the stomach without definite evidence of gastric outlet obstruction. The esophagus is grossly unremarkable. The thyroid gland is atrophic. No thyroid nodules identified. Lungs/Pleura: There are small bilateral pleural effusions with associated partial compressive consolidation of the lower lobes, new since the CT of 07/31/2016. Pneumonia is not excluded. Clinical correlation and follow-up to resolution is recommended. There is no pneumothorax. The central airways are patent. Musculoskeletal: There is no axillary adenopathy. Mild diffuse subcutaneous edema. The chest wall soft tissues are otherwise unremarkable. The there is osteopenia with degenerative changes of the spine. No acute fracture. Review of the MIP images confirms the above findings. CT ABDOMEN and PELVIS FINDINGS No intra-abdominal free air. Small ascites. There is a 12 x 12 cm fluid collection within the pelvis which appears loculated with enhancing walls concerning for an infected collection and developing abscess. Hepatobiliary: A 1.6 x 1.9 cm focal nodular enhancement in the inferior aspect of the right lobe of the liver (series 501 image 34 is incompletely characterized but most likely represents a portal venous shunting versus a hemangioma. Other etiologies are not excluded. This appears similar to prior studies. There is no intrahepatic biliary ductal dilatation. The gallbladder is mildly distended. There is small amount of layering sludge in the gallbladder fundus. No pericholecystic fluid. Pancreas: A  1 cm low attenuating/cystic listen arising from the anterior aspect of the head of the pancreas is incompletely characterized but may represent a side branch IPMN or retention cyst. Other etiologies are not excluded. MRI may provide better characterisation. There is no dilatation of the main pancreatic duct or associated gland atrophy. Spleen: Normal in size without focal abnormality. Adrenals/Urinary Tract: The adrenal glands, kidneys, visualized ureters appear unremarkable. The urinary bladder is decompressed around a Foley catheter. Stomach/Bowel: There is postsurgical changes of right hemicolectomy with ileotransverse anastomosis in the mid abdomen. Multiple mildly dilated and thickened loops of small bowel throughout the abdomen most compatible with a degree of ileus. Superimposed enteritis is not excluded. Clinical correlation is recommended. There is abutment of bowel loops to the anterior peritoneal wall compatible with adhesions. There is extensive sigmoid diverticulosis with muscular hypertrophy. No active inflammatory changes. Evaluation of the bowel is limited in the absence of oral contrast. Vascular/Lymphatic: There is moderate aortoiliac atherosclerotic disease. The origins of the celiac axis, SMA, IMA as well as the origins of the renal arteries are patent. The SMV, splenic vein, and main portal vein are patent. No portal venous gas identified. There is no adenopathy. Reproductive: Hysterectomy. Other: There is diffuse mesenteric edema as well as diffuse subcutaneous edema and anasarca. Musculoskeletal: Multilevel degenerative changes of spine. Osteopenia. No acute fracture. IMPRESSION: Interval development of small bilateral pleural effusions with associated partial consolidative changes of the lung bases which may represent atelectasis versus infiltrate. Clinical correlation and follow-up to resolution recommended. No CT evidence of aortic dissection or pulmonary embolism. Large hiatal hernia  containing the majority of the stomach. Postsurgical changes of right hemicolectomy with ileotransverse anastomosis. Mildly distended thickened loops of small bowel most compatible with an ileus. Enteritis is not excluded. Clinical correlation is recommended. No evidence of bowel obstruction. Sigmoid diverticulosis. Small ascites, diffuse mesenteric  edema and anasarca. Loculated appearing fluid within the pelvis with enhancing wall concerning for an infected collection or developing abscess. Clinical correlation is recommended. Electronically Signed   By: Anner Crete M.D.   On: 08/30/2016 22:02   Ct Abdomen Pelvis W Contrast Dg Cystogram  Result Date: 09/01/2016 CLINICAL DATA:  Pelvic urinoma. EXAM: CYSTOGRAM COMPARISON:  CT 08/24/2016 and 08/30/2016. FINDINGS: Multiple C-arm images are provided. Initial image labeled left ureter shows no abnormal pattern they could be consistent with extravasation. Subsequent images show contrast within both renal collecting systems. Normal appearing right ureter is demonstrated. Contrast filled bladder with Foley catheter balloon is demonstrated, though the left side is not included on the images. IMPRESSION: Multiple C-arm images appear to show extravasation at the distal left ureter. Electronically Signed   By: Nelson Chimes M.D.   On: 09/01/2016 06:36   Dg Abd Acute W/chest  Result Date: 08/23/2016 CLINICAL DATA:  Partial colectomy August 20, 2016. Gaseous distension. Follow-up. EXAM: DG ABDOMEN ACUTE W/ 1V CHEST COMPARISON:  August 22, 2016 FINDINGS: Free air is consistent with recent surgery. The NG tube terminates in the stomach, pulled back somewhat in the interval. Both the distal tip and sideport are in the stomach. Bibasilar opacities are likely atelectasis, similar in the interval. Stable cardiomegaly and hiatal hernia. No other changes in the chest. Air-filled loops of large and small bowel are similar in the interval, likely ileus. Air in the  subcutaneous tissues may be from recent surgery. There is gas to the level the rectum on this study. IMPRESSION: 1. Appropriate placement of NG tube. Postoperative free air. Continued ileus. 2. Effusions and underlying atelectasis in the lung bases. Electronically Signed   By: Dorise Bullion III M.D   On: 08/23/2016 07:48   Dg Abd Acute W/chest  Result Date: 08/22/2016 CLINICAL DATA:  Pt had a laparoscopic hemicolectomy from colon cancer on 08-20-16. Pt is feeling nauseous and is vomiting now. EXAM: DG ABDOMEN ACUTE W/ 1V CHEST COMPARISON:  07/31/2016, 07/24/2016 FINDINGS: Shallow lung inflation. Heart size is normal. There is bibasilar atelectasis. More focal opacity at the left lung base may indicate infectious infiltrate. Left pleural effusion is noted. There is gaseous distension of numerous bowel loops throughout the abdomen and pelvis. Moderate free intraperitoneal air. Moderate subcutaneous gas along the abdominal wall. Moderate degenerative changes in the lumbar spine, SI joints. IMPRESSION: 1. Moderate free intraperitoneal air and subcutaneous gas following recent surgery. 2. Significant gas is distension of bowel loops throughout the abdomen and pelvis, favoring ileus. 3. Left lower lobe infiltrate and left pleural effusion. Electronically Signed   By: Nolon Nations M.D.   On: 08/22/2016 17:16   Dg Abd Portable 1v  Result Date: 08/22/2016 CLINICAL DATA:  NG tube placement. EXAM: PORTABLE ABDOMEN - 1 VIEW COMPARISON:  Radiographs earlier this day FINDINGS: Tip and side port of the enteric tube below the diaphragm likely in the stomach. There is a large hiatal hernia. Free intra-abdominal air consistent with recent surgery, unchanged. Gaseous distention of bowel loops in the upper abdomen again seen. Subcutaneous gas in the right abdominal wall. There is atherosclerosis of the thoracic aorta. There is blunting of the costophrenic angle with small effusion and linear opacities. IMPRESSION: 1. Tip  and side port of the enteric tube below the diaphragm, likely in the stomach. There is a large hiatal hernia. 2. Unchanged gaseous distention of bowel loops in the abdomen, suspect ileus. Free air post recent intra-abdominal surgery. 3. Small left pleural effusion and  basilar opacity, unchanged. Electronically Signed   By: Jeb Levering M.D.   On: 08/22/2016 18:30   Dg Abd Portable 2v  Result Date: 08/24/2016 CLINICAL DATA:  Ileus EXAM: PORTABLE ABDOMEN - 2 VIEW COMPARISON:  August 23, 2016 FINDINGS: Supine and upright images obtained. The distal nasogastric tube appears coiled in a hiatal hernia ; tip is tube is within an apparent hiatal hernia. The previously noted pneumoperitoneum is much less appreciable on this current examination consistent with resolving postoperative pneumoperitoneum. There is persistent bowel dilatation, likely due to ileus. Areas noted in both small and large bowel. IMPRESSION: Apparent resolving postoperative pneumoperitoneum. Suspect persistent ileus with persistent bowel dilatation. No appreciable air-fluid levels noted. Nasogastric to tip appears to reside within a rather sizable hiatal hernia. Electronically Signed   By: Lowella Grip III M.D.   On: 08/24/2016 07:15   Dg C-arm 1-60 Min  Result Date: 09/01/2016 CLINICAL DATA:  Pelvic urinoma. EXAM: CYSTOGRAM COMPARISON:  CT 08/28/2016 and 08/30/2016. FINDINGS: Multiple C-arm images are provided. Initial image labeled left ureter shows no abnormal pattern they could be consistent with extravasation. Subsequent images show contrast within both renal collecting systems. Normal appearing right ureter is demonstrated. Contrast filled bladder with Foley catheter balloon is demonstrated, though the left side is not included on the images. IMPRESSION: Multiple C-arm images appear to show extravasation at the distal left ureter. Electronically Signed   By: Nelson Chimes M.D.   On: 09/01/2016 06:36   Ct Image Guided Drainage  Percut Cath  Peritoneal Retroperit  Result Date: 09/01/2016 INDICATION: 82 year old female with colon cancer and recent right colon resection and hysterectomy. Patient presents with abdominal pain, elevated white count and large pelvic fluid collection on CT imaging. Concern for a large pelvic abscess collection. EXAM: CT-GUIDED DRAINAGE OF PELVIC FLUID COLLECTION MEDICATIONS: The patient is currently admitted to the hospital and receiving intravenous antibiotics. The antibiotics were administered within an appropriate time frame prior to the initiation of the procedure. ANESTHESIA/SEDATION: Fentanyl 0.5 mcg IV; Versed 12.5 mg IV Moderate Sedation Time:  20 minutes The patient was continuously monitored during the procedure by the interventional radiology nurse under my direct supervision. COMPLICATIONS: None immediate. PROCEDURE: Informed written consent was obtained from the patient after a thorough discussion of the procedural risks, benefits and alternatives. All questions were addressed. Maximal Sterile Barrier Technique was utilized including caps, mask, sterile gowns, sterile gloves, sterile drape, hand hygiene and skin antiseptic. A timeout was performed prior to the initiation of the procedure. Patient was placed prone on the CT scanner and images of the pelvis were obtained. Additional images of the abdomen were obtained as well. The right gluteal region was prepped and draped in a sterile fashion. Skin was prepped with chlorhexidine. Skin and soft tissues were anesthetized with 1% lidocaine. An 18 gauge needle was directed into the large pelvic fluid collection from a right transgluteal approach with CT guidance. Clear yellow fluid was aspirated. Stiff Amplatz wire was advanced into the collection. The tract was dilated to accommodate a 10.2 [place] multipurpose drain. Clear yellow fluid was aspirated and compatible with urine. Catheter was sutured to the skin. A sample sent for culture. Bandage placed  over the transgluteal drain. FINDINGS: The initial CT images demonstrate high-density contrast throughout the pelvic fluid collection. Additional images of the abdomen confirm that the only high-density contrast was associated with the urinary bladder. This fluid is compatible with urine. The urinary bladder is decompressed with a Foley catheter. 18 gauge needle was directed  into the pelvic fluid collection from a right transgluteal approach. Drain placement confirmed within the large pelvic fluid collection. Clear yellow fluid/urine was aspirated from the collection and attached to gravity bag. IMPRESSION: The large pelvic fluid collection contains high-density contrast and findings are compatible with a urine leak. Based on the history of recent hysterectomy and colon resection, suspect there is an injury to a ureter or possibly the urinary bladder. Recommend urology consultation. Successful placement of a CT-guided transgluteal pelvic drain. Electronically Signed   By: Markus Daft M.D.   On: 08/21/2016 16:18     Assessment   82 y.o. 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  Plan   1. Postop:  continue routine post op cares. Discussed nature and cause of the injury with Ms. Kuechler and her daughter this morning.   2. ICU:  Hypotension: asymptomatic; gentle bolus 500cc fluid.  She is not requiring pressors.   Anemia: likely due to acute blood loss from surgery, will recheck later today for trend.  If she remains hypotensive and tachycardic will likely recommend one unit PRBC   Nutrition: clear diet ordered; likely will transition back to regular diet this admission  Fluids: continue IVF, D5 1/2NS + KCL @ 125cc/hr   Acute Kidney Injury: the elevated creatinine is due to reabsoption of the urine from the urinoma, and now secondarily due to a stent in place; these are all resolving factors and not reflective of the  actual kidney function.  Continue broad-spectrum IV antibiotics post-op.  She is tolerating PO and would be able to have any antibiotics necessary from a GU standpoint.  3. Colon cancer: s/p hemicolectomy. pathology reviewed and will await oncology input for treatment 4. Disposition: Continue ICU level admission until hypotension has resolved.  As surgery and urology are primary navigators of Ms. Minyard care, I will defer all management decisions to them unless they ask my input.  I am happy to assume her care due to this gynecologic surgical injury.    ----- Larey Days, MD Attending Obstetrician and Gynecologist [facility], Department of [facility]

## 2016-09-02 ENCOUNTER — Inpatient Hospital Stay: Payer: Medicare Other

## 2016-09-02 ENCOUNTER — Other Ambulatory Visit: Payer: Self-pay

## 2016-09-02 LAB — COMPREHENSIVE METABOLIC PANEL
ALBUMIN: 1.5 g/dL — AB (ref 3.5–5.0)
ALT: 11 U/L — AB (ref 14–54)
ANION GAP: 4 — AB (ref 5–15)
AST: 24 U/L (ref 15–41)
Alkaline Phosphatase: 55 U/L (ref 38–126)
BILIRUBIN TOTAL: 0.6 mg/dL (ref 0.3–1.2)
BUN: 20 mg/dL (ref 6–20)
CALCIUM: 6.8 mg/dL — AB (ref 8.9–10.3)
CO2: 22 mmol/L (ref 22–32)
CREATININE: 0.9 mg/dL (ref 0.44–1.00)
Chloride: 110 mmol/L (ref 101–111)
GFR calc Af Amer: >60 mL/min (ref >60)
GFR calc non Af Amer: 57 mL/min — ABNORMAL LOW (ref >60)
GLUCOSE: 108 mg/dL — AB (ref 65–99)
Potassium: 4.8 mmol/L (ref 3.5–5.1)
SODIUM: 136 mmol/L (ref 135–145)
TOTAL PROTEIN: 4.6 g/dL — AB (ref 6.5–8.1)

## 2016-09-02 LAB — CBC
HEMATOCRIT: 24.8 % — AB (ref 35.0–47.0)
HEMATOCRIT: 29.1 % — AB (ref 35.0–47.0)
HEMOGLOBIN: 8 g/dL — AB (ref 12.0–16.0)
Hemoglobin: 9.2 g/dL — ABNORMAL LOW (ref 12.0–16.0)
MCH: 26.9 pg (ref 26.0–34.0)
MCH: 26.3 pg (ref 26.0–34.0)
MCHC: 32.1 g/dL (ref 32.0–36.0)
MCHC: 31.5 g/dL — ABNORMAL LOW (ref 32.0–36.0)
MCV: 83.9 fL (ref 80.0–100.0)
MCV: 83.4 fL (ref 80.0–100.0)
PLATELETS: 369 10*3/uL (ref 150–440)
Platelets: 298 10*3/uL (ref 150–440)
RBC: 2.96 MIL/uL — AB (ref 3.80–5.20)
RBC: 3.49 MIL/uL — AB (ref 3.80–5.20)
RDW: 15 % — ABNORMAL HIGH (ref 11.5–14.5)
RDW: 15 % — ABNORMAL HIGH (ref 11.5–14.5)
WBC: 27.7 10*3/uL — AB (ref 3.6–11.0)
WBC: 32.4 10*3/uL — AB (ref 3.6–11.0)

## 2016-09-02 LAB — BASIC METABOLIC PANEL
ANION GAP: 5 (ref 5–15)
BUN: 15 mg/dL (ref 6–20)
CALCIUM: 7.3 mg/dL — AB (ref 8.9–10.3)
CO2: 22 mmol/L (ref 22–32)
Chloride: 109 mmol/L (ref 101–111)
Creatinine, Ser: 0.69 mg/dL (ref 0.44–1.00)
GLUCOSE: 134 mg/dL — AB (ref 65–99)
POTASSIUM: 4.8 mmol/L (ref 3.5–5.1)
Sodium: 136 mmol/L (ref 135–145)

## 2016-09-02 LAB — TROPONIN I: TROPONIN I: 0.05 ng/mL — AB (ref <0.03)

## 2016-09-02 LAB — PHOSPHORUS: Phosphorus: 2.6 mg/dL (ref 2.5–4.6)

## 2016-09-02 LAB — PROTIME-INR
INR: 1.62
PROTHROMBIN TIME: 19.4 s — AB (ref 11.4–15.2)

## 2016-09-02 LAB — MAGNESIUM: Magnesium: 1.7 mg/dL (ref 1.7–2.4)

## 2016-09-02 LAB — APTT: aPTT: 51 seconds — ABNORMAL HIGH (ref 24–36)

## 2016-09-02 MED ORDER — PANTOPRAZOLE SODIUM 40 MG PO TBEC: 40.0000 mg | DELAYED_RELEASE_TABLET | ORAL | Status: DC

## 2016-09-02 MED ORDER — KETOROLAC TROMETHAMINE 30 MG/ML IJ SOLN
30.0000 mg | Freq: Four times a day (QID) | INTRAMUSCULAR | Status: DC
  Administered 2016-09-02: 30 mg via INTRAVENOUS
  Filled 2016-09-02 (×4): qty 1

## 2016-09-02 MED ORDER — METOPROLOL TARTRATE 25 MG PO TABS
25.0000 mg | ORAL_TABLET | Freq: Two times a day (BID) | ORAL | Status: DC
  Administered 2016-09-02 – 2016-09-12 (×10): 25 mg via ORAL
  Filled 2016-09-02 (×16): qty 1

## 2016-09-02 MED ORDER — ORAL CARE MOUTH RINSE
15.0000 mL | Freq: Two times a day (BID) | OROMUCOSAL | Status: DC
  Administered 2016-09-02 – 2016-09-13 (×21): 15 mL via OROMUCOSAL

## 2016-09-02 MED ORDER — FUROSEMIDE 10 MG/ML IJ SOLN
20.0000 mg | Freq: Once | INTRAMUSCULAR | Status: AC
  Administered 2016-09-02: 20 mg via INTRAVENOUS
  Filled 2016-09-02: qty 2

## 2016-09-02 MED ORDER — DEXTROSE IN LACTATED RINGERS 5 % IV SOLN
INTRAVENOUS | Status: DC
  Administered 2016-09-03 – 2016-09-04 (×3): via INTRAVENOUS

## 2016-09-02 MED ORDER — PANTOPRAZOLE SODIUM 40 MG PO TBEC
40.0000 mg | DELAYED_RELEASE_TABLET | ORAL | Status: DC
  Administered 2016-09-02: 40 mg via ORAL
  Filled 2016-09-02: qty 1

## 2016-09-02 MED ORDER — PANTOPRAZOLE SODIUM 40 MG IV SOLR
40.0000 mg | Freq: Two times a day (BID) | INTRAVENOUS | Status: DC
  Administered 2016-09-02 – 2016-09-13 (×22): 40 mg via INTRAVENOUS
  Filled 2016-09-02 (×23): qty 40

## 2016-09-02 NOTE — Progress Notes (Signed)
2 Days Post-Op   Subjective:  Patient reports feeling better today. Continues to have some pain to the left side into her left chest associated with her left sided ureteral injury. Has tolerated diet without nausea or vomiting.  Vital signs in last 24 hours: Temp:  [98.2 F (36.8 C)-98.7 F (37.1 C)] 98.3 F (36.8 C) (12/20 0800) Pulse Rate:  [83-137] 109 (12/20 1100) Resp:  [15-33] 28 (12/20 1100) BP: (91-131)/(44-72) 126/52 (12/20 1100) SpO2:  [94 %-100 %] 99 % (12/20 1100) Last BM Date: 08/30/16  Intake/Output from previous day: 12/19 0701 - 12/20 0700 In: 2535 [P.O.:830; I.V.:1250] Out: 450 [Urine:450]  GI: Abdomen soft, appropriately tender to palpation her incision sites, nondistended. Dressing changed today with incision well approximated without any evidence of erythema or drainage. JP drains in place with a serosanguineous drainage.  Lab Results:  CBC  Recent Labs  09/01/16 0607 09/02/16 0606  WBC 40.4* 27.7*  HGB 8.5* 8.0*  HCT 26.3* 24.8*  PLT 281 298   CMP     Component Value Date/Time   NA 136 09/02/2016 0606   K 4.8 09/02/2016 0606   CL 110 09/02/2016 0606   CO2 22 09/02/2016 0606   GLUCOSE 108 (H) 09/02/2016 0606   BUN 20 09/02/2016 0606   CREATININE 0.90 09/02/2016 0606   CALCIUM 6.8 (L) 09/02/2016 0606   PROT 4.6 (L) 09/02/2016 0606   ALBUMIN 1.5 (L) 09/02/2016 0606   AST 24 09/02/2016 0606   ALT 11 (L) 09/02/2016 0606   ALKPHOS 55 09/02/2016 0606   BILITOT 0.6 09/02/2016 0606   GFRNONAA 57 (L) 09/02/2016 0606   GFRAA >60 09/02/2016 0606   PT/INR  Recent Labs  08/24/2016 1140 09/02/16 0606  LABPROT 17.8* 19.4*  INR 1.45 1.62    Studies/Results: Dg Cystogram  Result Date: 09/01/2016 CLINICAL DATA:  Pelvic urinoma. EXAM: CYSTOGRAM COMPARISON:  CT 08/30/2016 and 08/30/2016. FINDINGS: Multiple C-arm images are provided. Initial image labeled left ureter shows no abnormal pattern they could be consistent with extravasation. Subsequent  images show contrast within both renal collecting systems. Normal appearing right ureter is demonstrated. Contrast filled bladder with Foley catheter balloon is demonstrated, though the left side is not included on the images. IMPRESSION: Multiple C-arm images appear to show extravasation at the distal left ureter. Electronically Signed   By: Nelson Chimes M.D.   On: 09/01/2016 06:36   Dg C-arm 1-60 Min  Result Date: 09/01/2016 CLINICAL DATA:  Pelvic urinoma. EXAM: CYSTOGRAM COMPARISON:  CT 08/24/2016 and 08/30/2016. FINDINGS: Multiple C-arm images are provided. Initial image labeled left ureter shows no abnormal pattern they could be consistent with extravasation. Subsequent images show contrast within both renal collecting systems. Normal appearing right ureter is demonstrated. Contrast filled bladder with Foley catheter balloon is demonstrated, though the left side is not included on the images. IMPRESSION: Multiple C-arm images appear to show extravasation at the distal left ureter. Electronically Signed   By: Nelson Chimes M.D.   On: 09/01/2016 06:36   Ct Image Guided Drainage Percut Cath  Peritoneal Retroperit  Result Date: 08/30/2016 INDICATION: 82 year old female with colon cancer and recent right colon resection and hysterectomy. Patient presents with abdominal pain, elevated white count and large pelvic fluid collection on CT imaging. Concern for a large pelvic abscess collection. EXAM: CT-GUIDED DRAINAGE OF PELVIC FLUID COLLECTION MEDICATIONS: The patient is currently admitted to the hospital and receiving intravenous antibiotics. The antibiotics were administered within an appropriate time frame prior to the initiation of the  procedure. ANESTHESIA/SEDATION: Fentanyl 0.5 mcg IV; Versed 12.5 mg IV Moderate Sedation Time:  20 minutes The patient was continuously monitored during the procedure by the interventional radiology nurse under my direct supervision. COMPLICATIONS: None immediate. PROCEDURE:  Informed written consent was obtained from the patient after a thorough discussion of the procedural risks, benefits and alternatives. All questions were addressed. Maximal Sterile Barrier Technique was utilized including caps, mask, sterile gowns, sterile gloves, sterile drape, hand hygiene and skin antiseptic. A timeout was performed prior to the initiation of the procedure. Patient was placed prone on the CT scanner and images of the pelvis were obtained. Additional images of the abdomen were obtained as well. The right gluteal region was prepped and draped in a sterile fashion. Skin was prepped with chlorhexidine. Skin and soft tissues were anesthetized with 1% lidocaine. An 18 gauge needle was directed into the large pelvic fluid collection from a right transgluteal approach with CT guidance. Clear yellow fluid was aspirated. Stiff Amplatz wire was advanced into the collection. The tract was dilated to accommodate a 10.2 [place] multipurpose drain. Clear yellow fluid was aspirated and compatible with urine. Catheter was sutured to the skin. A sample sent for culture. Bandage placed over the transgluteal drain. FINDINGS: The initial CT images demonstrate high-density contrast throughout the pelvic fluid collection. Additional images of the abdomen confirm that the only high-density contrast was associated with the urinary bladder. This fluid is compatible with urine. The urinary bladder is decompressed with a Foley catheter. 18 gauge needle was directed into the pelvic fluid collection from a right transgluteal approach. Drain placement confirmed within the large pelvic fluid collection. Clear yellow fluid/urine was aspirated from the collection and attached to gravity bag. IMPRESSION: The large pelvic fluid collection contains high-density contrast and findings are compatible with a urine leak. Based on the history of recent hysterectomy and colon resection, suspect there is an injury to a ureter or possibly the  urinary bladder. Recommend urology consultation. Successful placement of a CT-guided transgluteal pelvic drain. Electronically Signed   By: Markus Daft M.D.   On: 08/19/2016 16:18    Assessment/Plan: 82 year old female now status post repair of left ureteral injury. Doing well. Plan to transfer out of the ICU to the MedSurg unit today. Physical therapy to evaluate and mobilize. Plan to start Toradol today. Plan to remove IR placed drain later today. Appreciate urologic assistance.   Clayburn Pert, MD FACS General Surgeon  09/02/2016

## 2016-09-02 NOTE — Progress Notes (Signed)
Urology Consult Follow Up  Subjective: POD 2 s/p left ureteral reimplant.  WBC trending down, drain output slowing.  Transferred out of ICU.    Sat on side of bed.  Not hungry this evening.  Burping but no flatus.  Mild sinus tachycardia, normotensive.    Anti-infectives: Anti-infectives    Start     Dose/Rate Route Frequency Ordered Stop   09/10/2016 0900  piperacillin-tazobactam (ZOSYN) IVPB 3.375 g  Status:  Discontinued     3.375 g 12.5 mL/hr over 240 Minutes Intravenous Every 8 hours 08/28/2016 0337 08/25/2016 0434   08/22/2016 0900  piperacillin-tazobactam (ZOSYN) IVPB 3.375 g     3.375 g 12.5 mL/hr over 240 Minutes Intravenous Every 8 hours 08/20/2016 0434        Current Facility-Administered Medications  Medication Dose Route Frequency Provider Last Rate Last Dose  . dextrose 5 % and 0.45 % NaCl with KCl 20 mEq/L infusion   Intravenous Continuous Florene Glen, MD 125 mL/hr at 09/02/16 1207    . heparin injection 5,000 Units  5,000 Units Subcutaneous Q8H Florene Glen, MD   5,000 Units at 09/02/16 1323  . HYDROcodone-acetaminophen (NORCO/VICODIN) 5-325 MG per tablet 1-2 tablet  1-2 tablet Oral Q6H PRN Florene Glen, MD   1 tablet at 09/02/2016 0545  . HYDROmorphone (DILAUDID) injection 0.5 mg  0.5 mg Intravenous Q3H PRN Florene Glen, MD   0.5 mg at 09/02/16 1117  . ketorolac (TORADOL) 30 MG/ML injection 30 mg  30 mg Intravenous Q6H Clayburn Pert, MD   30 mg at 09/02/16 1323  . MEDLINE mouth rinse  15 mL Mouth Rinse BID Florene Glen, MD   15 mL at 09/02/16 1000  . metoprolol tartrate (LOPRESSOR) tablet 25 mg  25 mg Oral BID Florene Glen, MD   25 mg at 09/02/16 0950  . ondansetron (ZOFRAN) injection 4 mg  4 mg Intravenous Q6H PRN Ardis Hughs, MD   4 mg at 09/02/16 0415  . pantoprazole (PROTONIX) EC tablet 40 mg  40 mg Oral Q24H Florene Glen, MD   40 mg at 09/02/16 1030  . piperacillin-tazobactam (ZOSYN) IVPB 3.375 g  3.375 g Intravenous Q8H Florene Glen, MD    3.375 g at 09/02/16 1415     Objective: Vital signs in last 24 hours: Temp:  [97.9 F (36.6 C)-98.7 F (37.1 C)] 97.9 F (36.6 C) (12/20 1624) Pulse Rate:  [83-137] 107 (12/20 1624) Resp:  [15-33] 16 (12/20 1624) BP: (91-135)/(44-72) 130/52 (12/20 1624) SpO2:  [93 %-100 %] 93 % (12/20 1624)  Intake/Output from previous day: 12/19 0701 - 12/20 0700 In: 2535 [P.O.:830; I.V.:1250] Out: 450 [Urine:450] Intake/Output this shift: Total I/O In: 375 [I.V.:375] Out: 390 [Urine:350; Drains:40]   Physical Exam  Constitutional: She is oriented to person, place, and time and well-developed, well-nourished, and in no distress.  Husband and daughter at bedside  HENT:  Head: Normocephalic.  Cardiovascular:  Mildly tachycardic  Pulmonary/Chest: Effort normal and breath sounds normal. No respiratory distress.  Abdominal: Soft.  Incision c/d/i.  Appropriately tender with deep palpation. Mildy tender today. Drain with thin blood tinged.    Genitourinary:  Genitourinary Comments: Foley with clear yellow urine  Neurological: She is alert and oriented to person, place, and time.  Skin: Skin is warm and dry.  Psychiatric: Mood and affect normal.    Lab Results:   Recent Labs  09/01/16 0607 09/02/16 0606  WBC 40.4* 27.7*  HGB 8.5* 8.0*  HCT  26.3* 24.8*  PLT 281 298   BMET  Recent Labs  09/01/16 0607 09/02/16 0606  NA 136 136  K 4.7 4.8  CL 110 110  CO2 21* 22  GLUCOSE 156* 108*  BUN 21* 20  CREATININE 0.94 0.90  CALCIUM 6.5* 6.8*   PT/INR  Recent Labs  09/11/2016 1140 09/02/16 0606  LABPROT 17.8* 19.4*  INR 1.45 1.62   ABG  Recent Labs  08/16/2016 0131 08/17/2016 0444  PHART 7.417 7.47*  HCO3 20.4 21.8    Studies/Results: Dg Cystogram  Result Date: 09/01/2016 CLINICAL DATA:  Pelvic urinoma. EXAM: CYSTOGRAM COMPARISON:  CT 08/24/2016 and 08/30/2016. FINDINGS: Multiple C-arm images are provided. Initial image labeled left ureter shows no abnormal pattern  they could be consistent with extravasation. Subsequent images show contrast within both renal collecting systems. Normal appearing right ureter is demonstrated. Contrast filled bladder with Foley catheter balloon is demonstrated, though the left side is not included on the images. IMPRESSION: Multiple C-arm images appear to show extravasation at the distal left ureter. Electronically Signed   By: Nelson Chimes M.D.   On: 09/01/2016 06:36   Dg C-arm 1-60 Min  Result Date: 09/01/2016 CLINICAL DATA:  Pelvic urinoma. EXAM: CYSTOGRAM COMPARISON:  CT 09/07/2016 and 08/30/2016. FINDINGS: Multiple C-arm images are provided. Initial image labeled left ureter shows no abnormal pattern they could be consistent with extravasation. Subsequent images show contrast within both renal collecting systems. Normal appearing right ureter is demonstrated. Contrast filled bladder with Foley catheter balloon is demonstrated, though the left side is not included on the images. IMPRESSION: Multiple C-arm images appear to show extravasation at the distal left ureter. Electronically Signed   By: Nelson Chimes M.D.   On: 09/01/2016 06:36     Assessment: s/p Procedure(s): CYSTOSCOPY WITH bilateral RETROGRADE PYELOGRAM, diagnostic URETEROSCOPY, exploratory laparotomy with reimplantation/repair of left ureter EXPLORATORY LAPAROTOMY POD 2  Plan: -maintain Foley x 7 days post op -ureteral stent to remain 4 weeks -monitor drain output, improiving -supportive care -continue IV abx, f/u culture data and adjust as needed -daily labs -advised to go slow with diet    LOS: 2 days    Hollice Espy 09/02/2016

## 2016-09-02 NOTE — Care Management (Signed)
Transferred from icu.  Patient had recent discharge after abdominal surgery  and readmitted with abdominal pain and shortness of breath.  There had been a referral to [facility] for SN and PT.  Nursing saw patient but PT had not opened. Patient initially required bipap. Found to have pelvic abscess.  PT consult pending .  Currently requiring supplemental 02.  Will have patient assessed for home 02.  [facility] is aware of admission

## 2016-09-02 NOTE — Evaluation (Signed)
Physical Therapy Evaluation Patient Details Name: Katherine Medina MRN: [id number] DOB: 09-04-1929 Today's Date: 09/02/2016   History of Present Illness  Pt admitted for complaints of abdominal pain and N/V. Noted pelvic fluid collection in abdomen. Pt with recent hospital stay due to hysterectomy and hemicolectomy on 12/7. Pt now with L ureteral injury, drained fluid, and is now s/p procedure to reimplant transected ureter into bladder with stent placement.  Pt with JP drain as well.  Clinical Impression  Pt is a pleasant 82 year old female who was admitted for abdominal pain and N/V. Pt performs bed mobility with mod assist and able to sit at EOB for a few minutes prior to fatigue. Pt unable to further transfer/ambulate at this time due to safety concerns. Pt demonstrates deficits with strength/mobility/endurance. All mobility performed with 2L of O2 with sats decreasing to 86%, cues for pursed lip breathing with improvement to 93%. Would benefit from skilled PT to address above deficits and promote optimal return to PLOF; recommend transition to STR upon discharge from acute hospitalization.       Follow Up Recommendations SNF    Equipment Recommendations  None recommended by PT    Recommendations for Other Services       Precautions / Restrictions Precautions Precautions: Fall Restrictions Weight Bearing Restrictions: No      Mobility  Bed Mobility Overal bed mobility: Needs Assistance Bed Mobility: Supine to Sit     Supine to sit: Mod assist     General bed mobility comments: assist for bed mobility including swinging B LEs off bed. Needs to pull on railing in order to rise upper body. Assist for scooting out towards EOB. Unable to maintain sitting balance without mod assist. Post-lateral leaning noted towards R side. Only able to sit for approx 3-5 minutes prior to fatigue.  Transfers                 General transfer comment: unable  secondary to  fatigue/pain/decreased endurance  Ambulation/Gait                Stairs            Wheelchair Mobility    Modified Rankin (Stroke Patients Only)       Balance Overall balance assessment: Needs assistance Sitting-balance support: Bilateral upper extremity supported Sitting balance-Leahy Scale: Poor                                       Pertinent Vitals/Pain Pain Assessment: Faces Faces Pain Scale: Hurts whole lot Pain Location: abdomen and shoulder Pain Descriptors / Indicators: Grimacing;Guarding;Aching Pain Intervention(s): Limited activity within patient's tolerance;Repositioned    Home Living Family/patient expects to be discharged to:: Private residence Living Arrangements: Spouse/significant other Available Help at Discharge: Family;Available 24 hours/day Type of Home: House Home Access: Stairs to enter Entrance Stairs-Rails: Can reach both Entrance Stairs-Number of Steps: 5 Home Layout: One level Home Equipment: Tub bench;Walker - 2 wheels;Bedside commode      Prior Function Level of Independence: Independent with assistive device(s)         Comments: independent using RW at home. Hadn't started HHPT yet     Hand Dominance        Extremity/Trunk Assessment   Upper Extremity Assessment Upper Extremity Assessment: Generalized weakness (B UE grossly 4/5)    Lower Extremity Assessment Lower Extremity Assessment: Generalized weakness (B LE grossly 3+/5)  Communication   Communication: No difficulties  Cognition Arousal/Alertness: Awake/alert Behavior During Therapy: WFL for tasks assessed/performed Overall Cognitive Status: Within Functional Limits for tasks assessed                      General Comments      Exercises Other Exercises Other Exercises: supine ther-ex performed including B LE ankle pumps, quad sets, SLRs, hip abd/add, and incentitive spironmeter. All therex performed x 10 reps with min  assist and cues for correct technique   Assessment/Plan    PT Assessment Patient needs continued PT services  PT Problem List Decreased strength;Decreased activity tolerance;Decreased balance;Decreased mobility;Decreased knowledge of use of DME;Decreased safety awareness;Decreased knowledge of precautions;Cardiopulmonary status limiting activity;Pain;Decreased skin integrity          PT Treatment Interventions DME instruction;Gait training;Stair training;Functional mobility training;Therapeutic activities;Therapeutic exercise;Balance training;Patient/family education    PT Goals (Current goals can be found in the Care Plan section)  Acute Rehab PT Goals Patient Stated Goal: to return home and get over this set back PT Goal Formulation: With patient Time For Goal Achievement: 09/16/16 Potential to Achieve Goals: Good    Frequency Min 2X/week   Barriers to discharge        Co-evaluation               End of Session Equipment Utilized During Treatment: Oxygen Activity Tolerance: Patient limited by pain Patient left: in bed;with bed alarm set;with nursing/sitter in room;with family/visitor present Nurse Communication: Mobility status         Time: [id number] PT Time Calculation (min) (ACUTE ONLY): 20 min   Charges:   PT Evaluation $PT Eval High Complexity: 1 Procedure PT Treatments $Therapeutic Exercise: 8-22 mins   PT G Codes:        Ahyana Skillin September 10, 2016, 5:14 PM  Greggory Stallion, PT, DPT [phone]

## 2016-09-02 NOTE — Progress Notes (Signed)
Pt seen and examined Transferred from the ICU Now w labored respiration and on O2 ( change from last night) Had some lunch but feeling nauseated and abd is distended VSS HR 120s NSR Lost her IV C/o left shoulder pain  PE :  Chest: some labored breathing w some rales, sinus tachy. Place EJ IV on the Left  Abd: incisions healing well ,n o infection. JP serous. No peritonitis, she is distended and decrease BS  A/P some resp issues will obtain CXR, EKG, labs including troponin Abd xray, seems to developed an ileus again, if so may need NGT decompression  Will give low dose lasix to help w her resp status DC toradol

## 2016-09-02 NOTE — Progress Notes (Signed)
Patient transferred from ICU.  Fluids were turned off, line had not been flushed.  IV is occluded and DC's

## 2016-09-03 ENCOUNTER — Inpatient Hospital Stay: Payer: Medicare Other

## 2016-09-03 LAB — TROPONIN I: TROPONIN I: 0.05 ng/mL — AB (ref <0.03)

## 2016-09-03 LAB — PHOSPHORUS: Phosphorus: 2.1 mg/dL — ABNORMAL LOW (ref 2.5–4.6)

## 2016-09-03 LAB — CBC
HCT: 26.1 % — ABNORMAL LOW (ref 35.0–47.0)
Hemoglobin: 8.5 g/dL — ABNORMAL LOW (ref 12.0–16.0)
MCH: 27.2 pg (ref 26.0–34.0)
MCHC: 32.4 g/dL (ref 32.0–36.0)
MCV: 83.9 fL (ref 80.0–100.0)
Platelets: 339 10*3/uL (ref 150–440)
RBC: 3.11 MIL/uL — AB (ref 3.80–5.20)
RDW: 15.3 % — AB (ref 11.5–14.5)
WBC: 28.1 10*3/uL — AB (ref 3.6–11.0)

## 2016-09-03 LAB — BASIC METABOLIC PANEL
ANION GAP: 4 — AB (ref 5–15)
BUN: 14 mg/dL (ref 6–20)
CALCIUM: 7.3 mg/dL — AB (ref 8.9–10.3)
CO2: 23 mmol/L (ref 22–32)
Chloride: 108 mmol/L (ref 101–111)
Creatinine, Ser: 0.63 mg/dL (ref 0.44–1.00)
Glucose, Bld: 150 mg/dL — ABNORMAL HIGH (ref 65–99)
POTASSIUM: 4.6 mmol/L (ref 3.5–5.1)
SODIUM: 135 mmol/L (ref 135–145)

## 2016-09-03 LAB — GLUCOSE, CAPILLARY
GLUCOSE-CAPILLARY: 124 mg/dL — AB (ref 65–99)
GLUCOSE-CAPILLARY: 138 mg/dL — AB (ref 65–99)
GLUCOSE-CAPILLARY: 149 mg/dL — AB (ref 65–99)
Glucose-Capillary: 107 mg/dL — ABNORMAL HIGH (ref 65–99)

## 2016-09-03 LAB — MAGNESIUM: Magnesium: 1.6 mg/dL — ABNORMAL LOW (ref 1.7–2.4)

## 2016-09-03 MED ORDER — TRACE MINERALS CR-CU-MN-SE-ZN 10-1000-500-60 MCG/ML IV SOLN
INTRAVENOUS | Status: DC
  Filled 2016-09-03: qty 960

## 2016-09-03 MED ORDER — FUROSEMIDE 10 MG/ML IJ SOLN
20.0000 mg | Freq: Once | INTRAMUSCULAR | Status: AC
  Administered 2016-09-03: 20 mg via INTRAVENOUS
  Filled 2016-09-03: qty 2

## 2016-09-03 MED ORDER — MAGNESIUM SULFATE 2 GM/50ML IV SOLN
2.0000 g | Freq: Once | INTRAVENOUS | Status: AC
Start: 2016-09-03 — End: 2016-09-03
  Administered 2016-09-03: 2 g via INTRAVENOUS
  Filled 2016-09-03: qty 50

## 2016-09-03 MED ORDER — INSULIN ASPART 100 UNIT/ML ~~LOC~~ SOLN
0.0000 [IU] | SUBCUTANEOUS | Status: DC
  Administered 2016-09-03 – 2016-09-04 (×2): 2 [IU] via SUBCUTANEOUS
  Administered 2016-09-04 (×2): 3 [IU] via SUBCUTANEOUS
  Administered 2016-09-04 – 2016-09-05 (×6): 2 [IU] via SUBCUTANEOUS
  Administered 2016-09-05: 3 [IU] via SUBCUTANEOUS
  Administered 2016-09-05: 2 [IU] via SUBCUTANEOUS
  Administered 2016-09-05: 3 [IU] via SUBCUTANEOUS
  Administered 2016-09-06: 2 [IU] via SUBCUTANEOUS
  Administered 2016-09-06: 3 [IU] via SUBCUTANEOUS
  Administered 2016-09-06: 2 [IU] via SUBCUTANEOUS
  Administered 2016-09-06 (×2): 3 [IU] via SUBCUTANEOUS
  Administered 2016-09-07: 2 [IU] via SUBCUTANEOUS
  Administered 2016-09-07 (×3): 3 [IU] via SUBCUTANEOUS
  Administered 2016-09-07 – 2016-09-08 (×4): 2 [IU] via SUBCUTANEOUS
  Administered 2016-09-08: 3 [IU] via SUBCUTANEOUS
  Administered 2016-09-08: 2 [IU] via SUBCUTANEOUS
  Administered 2016-09-08: 3 [IU] via SUBCUTANEOUS
  Administered 2016-09-09 – 2016-09-10 (×3): 2 [IU] via SUBCUTANEOUS
  Administered 2016-09-10: 3 [IU] via SUBCUTANEOUS
  Administered 2016-09-10: 2 [IU] via SUBCUTANEOUS
  Administered 2016-09-10: 3 [IU] via SUBCUTANEOUS
  Administered 2016-09-10 – 2016-09-11 (×7): 2 [IU] via SUBCUTANEOUS
  Administered 2016-09-11: 3 [IU] via SUBCUTANEOUS
  Administered 2016-09-12 – 2016-09-13 (×4): 2 [IU] via SUBCUTANEOUS
  Administered 2016-09-13 (×2): 3 [IU] via SUBCUTANEOUS
  Filled 2016-09-03: qty 2
  Filled 2016-09-03: qty 4
  Filled 2016-09-03: qty 2
  Filled 2016-09-03: qty 3
  Filled 2016-09-03 (×2): qty 2
  Filled 2016-09-03: qty 3
  Filled 2016-09-03 (×4): qty 2
  Filled 2016-09-03: qty 3
  Filled 2016-09-03 (×3): qty 2
  Filled 2016-09-03: qty 5
  Filled 2016-09-03: qty 2
  Filled 2016-09-03 (×2): qty 3
  Filled 2016-09-03 (×2): qty 2
  Filled 2016-09-03: qty 3
  Filled 2016-09-03: qty 2
  Filled 2016-09-03: qty 3
  Filled 2016-09-03 (×3): qty 2
  Filled 2016-09-03: qty 3
  Filled 2016-09-03 (×4): qty 2
  Filled 2016-09-03: qty 3
  Filled 2016-09-03: qty 2
  Filled 2016-09-03: qty 3
  Filled 2016-09-03: qty 2
  Filled 2016-09-03: qty 3
  Filled 2016-09-03: qty 2
  Filled 2016-09-03: qty 3
  Filled 2016-09-03: qty 2
  Filled 2016-09-03: qty 3
  Filled 2016-09-03 (×3): qty 2
  Filled 2016-09-03: qty 3
  Filled 2016-09-03: qty 2
  Filled 2016-09-03 (×3): qty 3
  Filled 2016-09-03: qty 2
  Filled 2016-09-03: qty 3

## 2016-09-03 MED ORDER — M.V.I. ADULT IV INJ
INJECTION | INTRAVENOUS | Status: DC
  Filled 2016-09-03: qty 960

## 2016-09-03 MED ORDER — METOPROLOL TARTRATE 5 MG/5ML IV SOLN
5.0000 mg | Freq: Once | INTRAVENOUS | Status: AC
  Administered 2016-09-03: 5 mg via INTRAVENOUS
  Filled 2016-09-03: qty 5

## 2016-09-03 MED ORDER — TRACE MINERALS CR-CU-MN-SE-ZN 10-1000-500-60 MCG/ML IV SOLN
INTRAVENOUS | Status: AC
  Administered 2016-09-03: 18:00:00 via INTRAVENOUS
  Filled 2016-09-03: qty 960

## 2016-09-03 MED ORDER — SODIUM PHOSPHATES 45 MMOLE/15ML IV SOLN
15.0000 mmol | Freq: Once | INTRAVENOUS | Status: AC
  Administered 2016-09-03: 15 mmol via INTRAVENOUS
  Filled 2016-09-03: qty 5

## 2016-09-03 NOTE — Progress Notes (Signed)
Urology Consult Follow Up  Subjective: POD 3 s/p left ureteral reimplant.  Required NG tube for ileus last night. Feeling better with NG drainage.  Transferred out of ICU.   Drain ouput 80 cc/24 hrs. WBC slowly trending down.  Anti-infectives: Anti-infectives    Start     Dose/Rate Route Frequency Ordered Stop   08/23/2016 0900  piperacillin-tazobactam (ZOSYN) IVPB 3.375 g  Status:  Discontinued     3.375 g 12.5 mL/hr over 240 Minutes Intravenous Every 8 hours 09/08/2016 0337 09/02/2016 0434   08/22/2016 0900  piperacillin-tazobactam (ZOSYN) IVPB 3.375 g     3.375 g 12.5 mL/hr over 240 Minutes Intravenous Every 8 hours 08/29/2016 0434        Current Facility-Administered Medications  Medication Dose Route Frequency Provider Last Rate Last Dose  . Marland KitchenTPN (CLINIMIX-E) Adult   Intravenous Continuous TPN Diego F Pabon, MD      . dextrose 5 % in lactated ringers infusion   Intravenous Continuous Jules Husbands, MD 75 mL/hr at 09/03/16 0005    . heparin injection 5,000 Units  5,000 Units Subcutaneous Q8H Florene Glen, MD   5,000 Units at 09/03/16 [phone]  . HYDROcodone-acetaminophen (NORCO/VICODIN) 5-325 MG per tablet 1-2 tablet  1-2 tablet Oral Q6H PRN Florene Glen, MD   1 tablet at 09/02/16 1905  . HYDROmorphone (DILAUDID) injection 0.5 mg  0.5 mg Intravenous Q3H PRN Florene Glen, MD   0.5 mg at 09/03/16 1132  . insulin aspart (novoLOG) injection 0-15 Units  0-15 Units Subcutaneous Q4H Diego F Pabon, MD      . magnesium sulfate IVPB 2 g 50 mL  2 g Intravenous Once Jules Husbands, MD      . MEDLINE mouth rinse  15 mL Mouth Rinse BID Florene Glen, MD   15 mL at 09/03/16 0830  . metoprolol tartrate (LOPRESSOR) tablet 25 mg  25 mg Oral BID Florene Glen, MD   25 mg at 09/02/16 0950  . ondansetron (ZOFRAN) injection 4 mg  4 mg Intravenous Q6H PRN Ardis Hughs, MD   4 mg at 09/02/16 1956  . pantoprazole (PROTONIX) injection 40 mg  40 mg Intravenous Q12H Jules Husbands, MD   40 mg at  09/03/16 1024  . piperacillin-tazobactam (ZOSYN) IVPB 3.375 g  3.375 g Intravenous Q8H Florene Glen, MD   3.375 g at 09/03/16 1250  . sodium phosphate 15 mmol in dextrose 5 % 250 mL infusion  15 mmol Intravenous Once Jules Husbands, MD         Objective: Vital signs in last 24 hours: Temp:  [97.9 F (36.6 C)-98.4 F (36.9 C)] 98.2 F (36.8 C) (12/21 1215) Pulse Rate:  [98-120] 120 (12/21 1215) Resp:  [16-18] 18 (12/21 1215) BP: (115-139)/(45-57) 129/57 (12/21 1215) SpO2:  [91 %-94 %] 91 % (12/21 1215)  Intake/Output from previous day: 12/20 0701 - 12/21 0700 In: 1781.3 [I.V.:1681.3; IV Piggyback:100] Out: 1080 [Urine:800; Emesis/NG output:200; Drains:80] Intake/Output this shift: Total I/O In: -  Out: 300 [Urine:300]   Physical Exam  Constitutional: She is well-developed, well-nourished, and in no distress.  Husband and daughter at bedside  HENT:  Head: Normocephalic.  Pulmonary/Chest: Effort normal.  Abdominal: Soft.  Incision c/d/i.  Approp T mild D. JP SS  Genitourinary:  Genitourinary Comments: Foley with clear yellow urine  Skin: Skin is warm and dry.    Lab Results:   Recent Labs  09/02/16 2114 09/03/16 0219  WBC 32.4* 28.1*  HGB 9.2* 8.5*  HCT 29.1* 26.1*  PLT 369 339   BMET  Recent Labs  09/02/16 2114 09/03/16 0219  NA 136 135  K 4.8 4.6  CL 109 108  CO2 22 23  GLUCOSE 134* 150*  BUN 15 14  CREATININE 0.69 0.63  CALCIUM 7.3* 7.3*   PT/INR  Recent Labs  09/02/16 0606  LABPROT 19.4*  INR 1.62   ABG No results for input(s): PHART, HCO3 in the last 72 hours.  Invalid input(s): PCO2, PO2  Studies/Results: Dg Abd 1 View  Result Date: 09/02/2016 CLINICAL DATA:  82 year old female with NG tube placement. EXAM: ABDOMEN - 1 VIEW COMPARISON:  Abdominal radiograph dated 09/02/2016 FINDINGS: An enteric tube is partially visualized with tip in the left upper abdomen over the gastric air. Persistent dilatation of air distended small bowel  measuring up to 5 cm in the left lower quadrant. A pigtail left ureteral stent is noted. No radiopaque stone identified along the course of the ureteral stent. There is osteopenia with multilevel degenerative changes of the spine. No acute fracture identified. A drainage catheter and cutaneous surgical clips noted over the pelvis. IMPRESSION: Enteric tube with tip in the proximal stomach. Persistent air distended loops of small bowel measuring up to 5 cm in the left lower quadrant. Continued follow-up recommended. Left ureteral stent is stable in position. No stone identified along the course of the stent. Electronically Signed   By: Anner Crete M.D.   On: 09/02/2016 23:05   Dg Abd Acute W/chest  Result Date: 09/02/2016 CLINICAL DATA:  Ileus EXAM: DG ABDOMEN ACUTE W/ 1V CHEST COMPARISON:  CT abdomen pelvis 08/30/2016 FINDINGS: There is shallow lung inflation with a large hiatal hernia noted. There is aortic atherosclerotic calcification in the arch. Small bilateral pleural effusions, right greater than left. Associated bibasilar atelectasis. A a left nephroureteral stent extends from the left renal shadow to the expected location of the urinary bladder. There is a drainage catheter overlying the pelvis. There are multiple dilated loops of gas-filled bowel with associated wall edema. No small bowel fluid levels are identified. No free intraperitoneal air. IMPRESSION: 1. Multiple dilated loops of gas-filled bowel with wall edema. Adynamic ileus is favored, though small bowel obstruction would be difficult to exclude. 2. Large hiatal hernia with bilateral small pleural effusions and associated atelectasis. Electronically Signed   By: Ulyses Jarred M.D.   On: 09/02/2016 20:56   Dg Abd Portable 2v  Result Date: 09/03/2016 CLINICAL DATA:  Abdominal pain and nausea and vomiting EXAM: PORTABLE ABDOMEN - 2 VIEW COMPARISON:  09/02/2016 FINDINGS: Nasogastric catheter is again coiled within the stomach. A left  ureteral stent is again seen. Scattered large and small bowel gas is noted. Surgical drain is noted within the pelvis. A few dilated loops of small bowel are again identified. These changes are most consistent with a postoperative ileus. No free air is seen. IMPRESSION: Stable postoperative ileus.  Continued follow-up is recommended. Electronically Signed   By: Inez Catalina M.D.   On: 09/03/2016 09:46     Assessment: s/p Procedure(s): CYSTOSCOPY WITH bilateral RETROGRADE PYELOGRAM, diagnostic URETEROSCOPY, exploratory laparotomy with reimplantation/repair of left ureter EXPLORATORY LAPAROTOMY POD 3  Plan: -maintain Foley x 7 days post op -ureteral stent to remain 4 weeks -monitor drain output, improving -supportive care -continue IV abx, f/u culture data and adjust as needed -daily labs -continue NG tube for now   LOS: 3 days    Nickie Retort 09/03/2016

## 2016-09-03 NOTE — Progress Notes (Signed)
Dilaudid  0.5 mg slow IVP given prior to the PICC line placement.

## 2016-09-03 NOTE — Progress Notes (Signed)
[facility] CONSULT NOTE   Pharmacy Consult for TPN  Indication: Prolonged ileus    Patient Measurements: Height: 5\' 4"  (162.6 cm) Weight: 164 lb 7.4 oz (74.6 kg) IBW/kg (Calculated) : 54.7 TPN AdjBW (KG): 59.7 Body mass index is 28.23 kg/m. Usual Weight:   Assessment: Pharmacy consulted to assist in the management of electrolytes and glucose in this  82 year old woman receiving TPN.   All electrolytes wnl except Magnesium and Phos.   GI:  Endo:  Insulin requirements in the past 24 hours: 0 units  Lytes: Renal: Pulm: Cards:  Hepatobil: Neuro: ID:  Best Practices: TPN Access: TPN start date:  Nutritional Goals (per RD recommendation on 2223 calories ): kCal: Protein: 110 gm   Current Nutrition:   Plan:   Clinimix E5/20  at 40 ml/hr  20% lipid emulsion at ml/hr MWF   10 ml  MVI and 1 ml trace elements in TPN  Will also order SSI q6 hours.    Will recheck electrolytes with am labs.   Welford Christmas D 09/03/2016,12:22 PM

## 2016-09-03 NOTE — Progress Notes (Addendum)
Nutrition Follow-up  DOCUMENTATION CODES:   Not applicable  INTERVENTION:  1. When central line is placed - recommend begin Clinimix 5/20 E @ 59mL/hr, titrate up to goal rate of 66mL/hr tomorrow at 1800 if her CBGs <180 and Electrolytes WNL Recommend 20% lipids @ 68mL/hr for 12 hrs, MWF  Currently Phos - 2.1, Mg 1.6  Recommend multivitamin Q24H and 50mg  IV thiamine BID for at least 3 days if pt exhibits signs of refeeding following initiation of nutrition support.  At goal, provides 2233 calories, 100gm protein, 1914mL fluid and 96gm fat  NUTRITION DIAGNOSIS:   Inadequate oral intake related to inability to eat as evidenced by NPO status. -ongoing  GOAL:   Patient will meet greater than or equal to 90% of their needs -will meet with TPN  MONITOR:   Diet advancement, Labs, Weight trends, I & O's  REASON FOR ASSESSMENT:   Consult New TPN/TNA  ASSESSMENT:   This patient was possibly a week out from a right colon resection for colon cancer and concomitant hysterectomy and nephrectomy. She was in her improving state of health at home but noticed worsening abdominal pain at first not controlled by Tylenol then not controlled by Vicodin either therefore 911 was called.  Pt developed ileus similar to previous admission Consulted to begin TPN, discussed with Dr. Adonis Huguenin, will begin TPN following placement of central line - lost her IV last night. Needs daily weights on TPN NPO Still feeling nauseated, abdomen distended. Labs and medications reviewed: Phos 2.1, Mg 1.6 D5 LR @ 47mL/hr --> 306 calories  Diet Order:  Diet NPO time specified  Skin:  Wound (see comment) (Abdominal surgical wounds)  Last BM:  12/18  Height:   Ht Readings from Last 1 Encounters:  09/04/2016 5\' 4"  (1.626 m)    Weight:   Wt Readings from Last 1 Encounters:  09/02/2016 164 lb 7.4 oz (74.6 kg)    Ideal Body Weight:  54.54 kg  BMI:  Body mass index is 28.23 kg/m.  Estimated Nutritional  Needs:   Kcal:  1700-2000 calories  Protein:  76-100 gm  Fluid:  >/= 1.7L  EDUCATION NEEDS:   No education needs identified at this time  Satira Anis. Kerry-Anne Mezo, MS, RD LDN Inpatient Clinical Dietitian Pager [phone]

## 2016-09-03 NOTE — Progress Notes (Signed)
3 Days Post-Op   Subjective:  Patient developed an ileus overnight requiring placement of NG tube. She's had relief of numerous complaints after placement of her NG tube. PICC line placed earlier today that she could be initiated on TPN. She denies any flatus and stated that her pain is currently well controlled.  Vital signs in last 24 hours: Temp:  [97.9 F (36.6 C)-98.4 F (36.9 C)] 98.2 F (36.8 C) (12/21 1215) Pulse Rate:  [98-120] 120 (12/21 1215) Resp:  [16-18] 18 (12/21 1215) BP: (115-139)/(45-57) 129/57 (12/21 1215) SpO2:  [91 %-94 %] 91 % (12/21 1215) Last BM Date: 08/30/16  Intake/Output from previous day: 12/20 0701 - 12/21 0700 In: 1781.3 [I.V.:1681.3; IV Piggyback:100] Out: 1080 [Urine:800; Emesis/NG output:200; Drains:80]  GI: Abdomen soft, appropriately tender to palpation at her incision sites, nondistended. JP drain in place draining approximately 80 mL of output over the last 24 hours.  Lab Results:  CBC  Recent Labs  09/02/16 2114 09/03/16 0219  WBC 32.4* 28.1*  HGB 9.2* 8.5*  HCT 29.1* 26.1*  PLT 369 339   CMP     Component Value Date/Time   NA 135 09/03/2016 0219   K 4.6 09/03/2016 0219   CL 108 09/03/2016 0219   CO2 23 09/03/2016 0219   GLUCOSE 150 (H) 09/03/2016 0219   BUN 14 09/03/2016 0219   CREATININE 0.63 09/03/2016 0219   CALCIUM 7.3 (L) 09/03/2016 0219   PROT 4.6 (L) 09/02/2016 0606   ALBUMIN 1.5 (L) 09/02/2016 0606   AST 24 09/02/2016 0606   ALT 11 (L) 09/02/2016 0606   ALKPHOS 55 09/02/2016 0606   BILITOT 0.6 09/02/2016 0606   GFRNONAA >60 09/03/2016 0219   GFRAA >60 09/03/2016 0219   PT/INR  Recent Labs  09/02/16 0606  LABPROT 19.4*  INR 1.62    Studies/Results: Dg Abd 1 View  Result Date: 09/02/2016 CLINICAL DATA:  82 year old female with NG tube placement. EXAM: ABDOMEN - 1 VIEW COMPARISON:  Abdominal radiograph dated 09/02/2016 FINDINGS: An enteric tube is partially visualized with tip in the left upper abdomen  over the gastric air. Persistent dilatation of air distended small bowel measuring up to 5 cm in the left lower quadrant. A pigtail left ureteral stent is noted. No radiopaque stone identified along the course of the ureteral stent. There is osteopenia with multilevel degenerative changes of the spine. No acute fracture identified. A drainage catheter and cutaneous surgical clips noted over the pelvis. IMPRESSION: Enteric tube with tip in the proximal stomach. Persistent air distended loops of small bowel measuring up to 5 cm in the left lower quadrant. Continued follow-up recommended. Left ureteral stent is stable in position. No stone identified along the course of the stent. Electronically Signed   By: Anner Crete M.D.   On: 09/02/2016 23:05   Dg Abd Acute W/chest  Result Date: 09/02/2016 CLINICAL DATA:  Ileus EXAM: DG ABDOMEN ACUTE W/ 1V CHEST COMPARISON:  CT abdomen pelvis 08/30/2016 FINDINGS: There is shallow lung inflation with a large hiatal hernia noted. There is aortic atherosclerotic calcification in the arch. Small bilateral pleural effusions, right greater than left. Associated bibasilar atelectasis. A a left nephroureteral stent extends from the left renal shadow to the expected location of the urinary bladder. There is a drainage catheter overlying the pelvis. There are multiple dilated loops of gas-filled bowel with associated wall edema. No small bowel fluid levels are identified. No free intraperitoneal air. IMPRESSION: 1. Multiple dilated loops of gas-filled bowel with wall edema.  Adynamic ileus is favored, though small bowel obstruction would be difficult to exclude. 2. Large hiatal hernia with bilateral small pleural effusions and associated atelectasis. Electronically Signed   By: Ulyses Jarred M.D.   On: 09/02/2016 20:56   Dg Abd Portable 2v  Result Date: 09/03/2016 CLINICAL DATA:  Abdominal pain and nausea and vomiting EXAM: PORTABLE ABDOMEN - 2 VIEW COMPARISON:  09/02/2016  FINDINGS: Nasogastric catheter is again coiled within the stomach. A left ureteral stent is again seen. Scattered large and small bowel gas is noted. Surgical drain is noted within the pelvis. A few dilated loops of small bowel are again identified. These changes are most consistent with a postoperative ileus. No free air is seen. IMPRESSION: Stable postoperative ileus.  Continued follow-up is recommended. Electronically Signed   By: Inez Catalina M.D.   On: 09/03/2016 09:46    Assessment/Plan: 82 year old female postop day #3 from repair of a left ureteral injury. Developed an ileus with feels better with NG tube. Plan to start TPN due to malnourished state with her ileus. Continue NG tube until bowel function returns. Encourage physical therapy, incentive spirometer usage. Appreciate urology assistance with this patient.   Clayburn Pert, MD FACS General Surgeon  09/03/2016

## 2016-09-04 LAB — COMPREHENSIVE METABOLIC PANEL
ALK PHOS: 71 U/L (ref 38–126)
ALT: 9 U/L — AB (ref 14–54)
AST: 22 U/L (ref 15–41)
Albumin: 1.3 g/dL — ABNORMAL LOW (ref 3.5–5.0)
Anion gap: 4 — ABNORMAL LOW (ref 5–15)
BILIRUBIN TOTAL: 0.7 mg/dL (ref 0.3–1.2)
BUN: 9 mg/dL (ref 6–20)
CALCIUM: 7.3 mg/dL — AB (ref 8.9–10.3)
CO2: 27 mmol/L (ref 22–32)
CREATININE: 0.56 mg/dL (ref 0.44–1.00)
Chloride: 106 mmol/L (ref 101–111)
Glucose, Bld: 140 mg/dL — ABNORMAL HIGH (ref 65–99)
Potassium: 3.6 mmol/L (ref 3.5–5.1)
Sodium: 137 mmol/L (ref 135–145)
TOTAL PROTEIN: 4.7 g/dL — AB (ref 6.5–8.1)

## 2016-09-04 LAB — BODY FLUID CULTURE: CULTURE: NO GROWTH

## 2016-09-04 LAB — MAGNESIUM: MAGNESIUM: 1.6 mg/dL — AB (ref 1.7–2.4)

## 2016-09-04 LAB — GLUCOSE, CAPILLARY
GLUCOSE-CAPILLARY: 120 mg/dL — AB (ref 65–99)
Glucose-Capillary: 144 mg/dL — ABNORMAL HIGH (ref 65–99)
Glucose-Capillary: 154 mg/dL — ABNORMAL HIGH (ref 65–99)
Glucose-Capillary: 157 mg/dL — ABNORMAL HIGH (ref 65–99)
Glucose-Capillary: 135 mg/dL — ABNORMAL HIGH (ref 65–99)

## 2016-09-04 LAB — PHOSPHORUS
Phosphorus: 1.8 mg/dL — ABNORMAL LOW (ref 2.5–4.6)
Phosphorus: 1.9 mg/dL — ABNORMAL LOW (ref 2.5–4.6)

## 2016-09-04 LAB — TRIGLYCERIDES: Triglycerides: 121 mg/dL (ref <150)

## 2016-09-04 MED ORDER — TRACE MINERALS CR-CU-MN-SE-ZN 10-1000-500-60 MCG/ML IV SOLN
INTRAVENOUS | Status: AC
  Administered 2016-09-04: 19:00:00 via INTRAVENOUS
  Filled 2016-09-04: qty 1440

## 2016-09-04 MED ORDER — MAGNESIUM SULFATE 4 GM/100ML IV SOLN
4.0000 g | Freq: Once | INTRAVENOUS | Status: AC
  Administered 2016-09-04: 4 g via INTRAVENOUS
  Filled 2016-09-04: qty 100

## 2016-09-04 MED ORDER — FAT EMULSION 20 % IV EMUL
250.0000 mL | INTRAVENOUS | Status: AC
  Administered 2016-09-04: 250 mL via INTRAVENOUS
  Filled 2016-09-04: qty 250

## 2016-09-04 MED ORDER — LACTATED RINGERS IV SOLN
INTRAVENOUS | Status: DC
  Administered 2016-09-04 – 2016-09-07 (×2): via INTRAVENOUS

## 2016-09-04 MED ORDER — POTASSIUM PHOSPHATES 15 MMOLE/5ML IV SOLN
20.0000 mmol | Freq: Once | INTRAVENOUS | Status: AC
  Administered 2016-09-04: 20 mmol via INTRAVENOUS
  Filled 2016-09-04: qty 6.67

## 2016-09-04 MED ORDER — METOPROLOL TARTRATE 5 MG/5ML IV SOLN
5.0000 mg | Freq: Two times a day (BID) | INTRAVENOUS | Status: DC
  Administered 2016-09-04 – 2016-09-05 (×4): 5 mg via INTRAVENOUS
  Filled 2016-09-04 (×4): qty 5

## 2016-09-04 NOTE — Progress Notes (Signed)
Nutrition Follow-up  DOCUMENTATION CODES:   Not applicable  INTERVENTION:  -Recommend switching to 5%AA/15%Dextrose with MVI/trace minerals with goal of 83 ml/hr with infusion of 20%Lipid infusion daily at rate of 20 ml/hr for 12 hours. Provides 100 g protein, 1894 kcals, 2000 mL of fluid. Meets 100% estimated needs. GIR 3.7. Recommend repleting phosphorus and rechecking today. If phosphorus >2.0 after infusion, recommend increasing to goal rate of 83 ml/hr today. If <2.0, recommend resupplementing and increasing rate of TPN to 60 ml/hr (then tomorrow if electrolytes wdl and FSBS <180, increase to goal of 83 ml/hr). Recommend addition of additional thiamine (50-100 mg IV) to TPN bag as pt at risk for refeeding syndrome.  Pt also on D5-LR at 40 ml/hr providing additional dextrose. As TPN volume increased, recommend removal of D5 from IVF or discontinuing fluids all together if additional fluid is not needed.  -No new weight since 12/18, recommend daily weights while on TPN infusion. Strict I/O. Orders placed  NUTRITION DIAGNOSIS:   Inadequate oral intake related to inability to eat as evidenced by NPO status.  Being addressed via TPN  GOAL:   Patient will meet greater than or equal to 90% of their needs  MONITOR:   Diet advancement, Labs, Weight trends, I & O's  REASON FOR ASSESSMENT:   Consult New TPN/TNA  ASSESSMENT:   Pt with postop ileus, abdominal xray pending for this AM  5%AA/20% Dextrose infusing at rate of 40 ml/hr, PICC line UOP 1900 mL in 24 hours, NG with 50 mL documented, no new weight since 12/18. Noted pt with 3+ edema in b/l LE, mild generalized edema present as well.   Labs: FSBS 101-149, phosphorus 1.8, magnesium 1.6 Meds: ss novolog, D5-LR at 40 ml/hr (163 kcals), potassium phosphate, magnesium sulfate  Diet Order:  Diet NPO time specified .TPN (CLINIMIX-E) Adult  Skin:  Wound (see comment) (Abdominal surgical wounds)  Last BM:  12/18  Height:   Ht  Readings from Last 1 Encounters:  09/04/2016 5\' 4"  (1.626 m)    Weight:   Wt Readings from Last 1 Encounters:  09/01/2016 164 lb 7.4 oz (74.6 kg)   Filed Weights   09/07/2016 0320 08/24/2016 0427  Weight: 136 lb (61.7 kg) 164 lb 7.4 oz (74.6 kg)    Ideal Body Weight:  54.54 kg  BMI:  Body mass index is 28.23 kg/m.  Estimated Nutritional Needs:   Kcal:  [id number] kcals  Protein:  94-113 g  Fluid:  >/= 1.8 L  EDUCATION NEEDS:   No education needs identified at this time  [place], [place], [place] [phone] Pager  [phone] Weekend/On-Call Pager

## 2016-09-04 NOTE — Progress Notes (Signed)
Physical Therapy Treatment Patient Details Name: Katherine Medina MRN: [id number] DOB: [birth year] Today's Date: 09/04/2016    History of Present Illness Pt admitted for complaints of abdominal pain and N/V. Noted pelvic fluid collection in abdomen. Pt with recent hospital stay due to hysterectomy and hemicolectomy on 12/7. Pt now with L ureteral injury, drained fluid, and is now s/p procedure to reimplant transected ureter into bladder with stent placement.  Pt with JP drain as well. Pt now with ileus and NG tube placement.    PT Comments    Pt is making gradual progress towards goals, however limited by complex medical condition. Pt now with NG tube, very fatigued with any exertion. Increased HR throughout session ranging tom 118-135bpm. Pt remained on 3L of O2 during session with cues for pursed lip breathing. Poor seated balance at EOB, unable to maintain sitting independently, unsafe for attempt for OOB mobility this date secondary to balance/strength/endurance deficits. Pt remains motivated to continue therapy. Gave written HEP to perform with family over holiday weekend. Will continue to progress as able.   Follow Up Recommendations  SNF     Equipment Recommendations       Recommendations for Other Services       Precautions / Restrictions Precautions Precautions: Fall Restrictions Weight Bearing Restrictions: No    Mobility  Bed Mobility Overal bed mobility: Needs Assistance Bed Mobility: Supine to Sit     Supine to sit: Max assist     General bed mobility comments: assist for sliding B LE off EOB and heavy assist required for trunk elevation. Once seated at EOB, needs heavy assist to maintain upright posture as she tends to lean towards R side. Able to self correct with cues and mod assist. Pt fatigues quickly while seated at EOB. All mobility performed with 3L of O2.  Transfers                 General transfer comment: unable to perform at this time secondary  to poor balance  Ambulation/Gait                 Stairs            Wheelchair Mobility    Modified Rankin (Stroke Patients Only)       Balance                                    Cognition Arousal/Alertness: Awake/alert Behavior During Therapy: WFL for tasks assessed/performed Overall Cognitive Status: Within Functional Limits for tasks assessed                      Exercises Other Exercises Other Exercises: supine ther-ex performed including B LE ankle pumps, quad sets, glut sets, SLRs, hip abd/add, and incentitive spironmeter. All therex performed x 12 reps with min assist and cues for correct technique    General Comments        Pertinent Vitals/Pain Pain Assessment: Faces Faces Pain Scale: Hurts even more Pain Location: gluts Pain Descriptors / Indicators: Discomfort Pain Intervention(s): Limited activity within patient's tolerance    Home Living                      Prior Function            PT Goals (current goals can now be found in the care plan section) Acute Rehab PT Goals Patient Stated  Goal: to return home and get over this set back PT Goal Formulation: With patient Time For Goal Achievement: 09/16/16 Potential to Achieve Goals: Good Progress towards PT goals: Progressing toward goals    Frequency    Min 2X/week      PT Plan Current plan remains appropriate    Co-evaluation             End of Session Equipment Utilized During Treatment: Oxygen Activity Tolerance: Patient limited by fatigue Patient left: in bed;with bed alarm set;with nursing/sitter in room;with family/visitor present     Time: [id number] PT Time Calculation (min) (ACUTE ONLY): 25 min  Charges:  $Therapeutic Exercise: 23-37 mins                    G Codes:      Halsey Persaud 09/29/16, 10:12 AM  Greggory Stallion, PT, DPT [phone]

## 2016-09-04 NOTE — Progress Notes (Signed)
Urology Consult Follow Up  Subjective: POD 4 s/p left ureteral reimplant.  NG tube in place. TPN started overnight.  Drain ouput 60 cc/24 hrs. Patient c/o incisional pain. +small amt of flatus. No BM. No nausea.  Anti-infectives: Anti-infectives    Start     Dose/Rate Route Frequency Ordered Stop   08/25/2016 0900  piperacillin-tazobactam (ZOSYN) IVPB 3.375 g  Status:  Discontinued     3.375 g 12.5 mL/hr over 240 Minutes Intravenous Every 8 hours 08/17/2016 0337 08/29/2016 0434   08/20/2016 0900  piperacillin-tazobactam (ZOSYN) IVPB 3.375 g     3.375 g 12.5 mL/hr over 240 Minutes Intravenous Every 8 hours 09/06/2016 0434        Current Facility-Administered Medications  Medication Dose Route Frequency Provider Last Rate Last Dose  . Marland KitchenTPN (CLINIMIX-E) Adult   Intravenous Continuous TPN Napoleon Form, RPH 40 mL/hr at 09/03/16 1809    . dextrose 5 % in lactated ringers infusion   Intravenous Continuous Jules Husbands, MD 40 mL/hr at 09/03/16 2027    . heparin injection 5,000 Units  5,000 Units Subcutaneous Q8H Florene Glen, MD   5,000 Units at 09/04/16 0500  . HYDROcodone-acetaminophen (NORCO/VICODIN) 5-325 MG per tablet 1-2 tablet  1-2 tablet Oral Q6H PRN Florene Glen, MD   1 tablet at 09/02/16 1905  . HYDROmorphone (DILAUDID) injection 0.5 mg  0.5 mg Intravenous Q3H PRN Florene Glen, MD   0.5 mg at 09/04/16 0500  . insulin aspart (novoLOG) injection 0-15 Units  0-15 Units Subcutaneous Q4H Jules Husbands, MD   2 Units at 09/04/16 0050  . MEDLINE mouth rinse  15 mL Mouth Rinse BID Florene Glen, MD   15 mL at 09/03/16 2200  . metoprolol (LOPRESSOR) injection 5 mg  5 mg Intravenous Q12H Clayburn Pert, MD      . metoprolol tartrate (LOPRESSOR) tablet 25 mg  25 mg Oral BID Florene Glen, MD   25 mg at 09/02/16 0950  . ondansetron (ZOFRAN) injection 4 mg  4 mg Intravenous Q6H PRN Ardis Hughs, MD   4 mg at 09/02/16 1956  . pantoprazole (PROTONIX) injection 40 mg  40 mg  Intravenous Q12H Jules Husbands, MD   40 mg at 09/03/16 2200  . piperacillin-tazobactam (ZOSYN) IVPB 3.375 g  3.375 g Intravenous Q8H Florene Glen, MD   3.375 g at 09/04/16 0500     Objective: Vital signs in last 24 hours: Temp:  [98.2 F (36.8 C)-98.8 F (37.1 C)] 98.2 F (36.8 C) (12/22 0400) Pulse Rate:  [118-120] 118 (12/22 0400) Resp:  [17-18] 17 (12/22 0400) BP: (126-129)/(51-57) 126/51 (12/22 0400) SpO2:  [91 %-96 %] 96 % (12/22 0400)  Intake/Output from previous day: 12/21 0701 - 12/22 0700 In: 1355.7 [I.V.:930.7; NG/GT:60; IV Piggyback:305] Out: 1900 [Urine:1900] Intake/Output this shift: No intake/output data recorded.   Physical Exam  Constitutional: She is well-developed, well-nourished, and in no distress.  HENT:  Head: Normocephalic.  Pulmonary/Chest: Effort normal.  Abdominal: Soft.  Incision c/d/i.  Approp T mild D. JP SS  Genitourinary:  Genitourinary Comments: Foley with clear yellow urine  Skin: Skin is warm and dry.    Lab Results:   Recent Labs  09/02/16 2114 09/03/16 0219  WBC 32.4* 28.1*  HGB 9.2* 8.5*  HCT 29.1* 26.1*  PLT 369 339   BMET  Recent Labs  09/03/16 0219 09/04/16 0518  NA 135 137  K 4.6 3.6  CL 108 106  CO2 23  27  GLUCOSE 150* 140*  BUN 14 9  CREATININE 0.63 0.56  CALCIUM 7.3* 7.3*   PT/INR  Recent Labs  09/02/16 0606  LABPROT 19.4*  INR 1.62   ABG No results for input(s): PHART, HCO3 in the last 72 hours.  Invalid input(s): PCO2, PO2  Studies/Results: Dg Abd 1 View  Result Date: 09/02/2016 CLINICAL DATA:  82 year old female with NG tube placement. EXAM: ABDOMEN - 1 VIEW COMPARISON:  Abdominal radiograph dated 09/02/2016 FINDINGS: An enteric tube is partially visualized with tip in the left upper abdomen over the gastric air. Persistent dilatation of air distended small bowel measuring up to 5 cm in the left lower quadrant. A pigtail left ureteral stent is noted. No radiopaque stone identified along  the course of the ureteral stent. There is osteopenia with multilevel degenerative changes of the spine. No acute fracture identified. A drainage catheter and cutaneous surgical clips noted over the pelvis. IMPRESSION: Enteric tube with tip in the proximal stomach. Persistent air distended loops of small bowel measuring up to 5 cm in the left lower quadrant. Continued follow-up recommended. Left ureteral stent is stable in position. No stone identified along the course of the stent. Electronically Signed   By: Anner Crete M.D.   On: 09/02/2016 23:05   Dg Abd Acute W/chest  Result Date: 09/02/2016 CLINICAL DATA:  Ileus EXAM: DG ABDOMEN ACUTE W/ 1V CHEST COMPARISON:  CT abdomen pelvis 08/30/2016 FINDINGS: There is shallow lung inflation with a large hiatal hernia noted. There is aortic atherosclerotic calcification in the arch. Small bilateral pleural effusions, right greater than left. Associated bibasilar atelectasis. A a left nephroureteral stent extends from the left renal shadow to the expected location of the urinary bladder. There is a drainage catheter overlying the pelvis. There are multiple dilated loops of gas-filled bowel with associated wall edema. No small bowel fluid levels are identified. No free intraperitoneal air. IMPRESSION: 1. Multiple dilated loops of gas-filled bowel with wall edema. Adynamic ileus is favored, though small bowel obstruction would be difficult to exclude. 2. Large hiatal hernia with bilateral small pleural effusions and associated atelectasis. Electronically Signed   By: Ulyses Jarred M.D.   On: 09/02/2016 20:56   Dg Abd Portable 2v  Result Date: 09/03/2016 CLINICAL DATA:  Abdominal pain and nausea and vomiting EXAM: PORTABLE ABDOMEN - 2 VIEW COMPARISON:  09/02/2016 FINDINGS: Nasogastric catheter is again coiled within the stomach. A left ureteral stent is again seen. Scattered large and small bowel gas is noted. Surgical drain is noted within the pelvis. A few  dilated loops of small bowel are again identified. These changes are most consistent with a postoperative ileus. No free air is seen. IMPRESSION: Stable postoperative ileus.  Continued follow-up is recommended. Electronically Signed   By: Inez Catalina M.D.   On: 09/03/2016 09:46     Assessment: s/p Procedure(s): CYSTOSCOPY WITH bilateral RETROGRADE PYELOGRAM, diagnostic URETEROSCOPY, exploratory laparotomy with reimplantation/repair of left ureter EXPLORATORY LAPAROTOMY POD 4  Plan: -maintain Foley x 7 days post op -ureteral stent to remain 4 weeks -monitor drain output -> continues to decrease. Low output currently -agree with TPN -continue IV abx, f/u culture data and adjust as needed -daily labs -continue NG tube for now. Await return of bowel function.   LOS: 4 days    R[place]anna 09/04/2016

## 2016-09-04 NOTE — Progress Notes (Signed)
4 Days Post-Op   Subjective:  Patient now complaining of pain but states her nausea has improved. NG output has been recorded as minimal but patient continues to show signs of ileus.  Vital signs in last 24 hours: Temp:  [98.2 F (36.8 C)-98.8 F (37.1 C)] 98.2 F (36.8 C) (12/22 0400) Pulse Rate:  [118-120] 118 (12/22 0400) Resp:  [17-18] 17 (12/22 0400) BP: (126-129)/(51-57) 126/51 (12/22 0400) SpO2:  [91 %-96 %] 96 % (12/22 0400) Last BM Date: 08/30/16  Intake/Output from previous day: 12/21 0701 - 12/22 0700 In: 1822 [I.V.:1397; NG/GT:60; IV Piggyback:305] Out: 2010 [Urine:1900; Emesis/NG output:50; Drains:60]  GI: Abdomen soft, appropriately tender to palpation at her incision sites, nondistended. JP in place draining a serosanguineous fluid. No evidence of infection or wound breakdown.  Lab Results:  CBC  Recent Labs  09/02/16 2114 09/03/16 0219  WBC 32.4* 28.1*  HGB 9.2* 8.5*  HCT 29.1* 26.1*  PLT 369 339   CMP     Component Value Date/Time   NA 137 09/04/2016 0518   K 3.6 09/04/2016 0518   CL 106 09/04/2016 0518   CO2 27 09/04/2016 0518   GLUCOSE 140 (H) 09/04/2016 0518   BUN 9 09/04/2016 0518   CREATININE 0.56 09/04/2016 0518   CALCIUM 7.3 (L) 09/04/2016 0518   PROT 4.7 (L) 09/04/2016 0518   ALBUMIN 1.3 (L) 09/04/2016 0518   AST 22 09/04/2016 0518   ALT 9 (L) 09/04/2016 0518   ALKPHOS 71 09/04/2016 0518   BILITOT 0.7 09/04/2016 0518   GFRNONAA >60 09/04/2016 0518   GFRAA >60 09/04/2016 0518   PT/INR  Recent Labs  09/02/16 0606  LABPROT 19.4*  INR 1.62    Studies/Results: Dg Abd 1 View  Result Date: 09/02/2016 CLINICAL DATA:  82 year old female with NG tube placement. EXAM: ABDOMEN - 1 VIEW COMPARISON:  Abdominal radiograph dated 09/02/2016 FINDINGS: An enteric tube is partially visualized with tip in the left upper abdomen over the gastric air. Persistent dilatation of air distended small bowel measuring up to 5 cm in the left lower  quadrant. A pigtail left ureteral stent is noted. No radiopaque stone identified along the course of the ureteral stent. There is osteopenia with multilevel degenerative changes of the spine. No acute fracture identified. A drainage catheter and cutaneous surgical clips noted over the pelvis. IMPRESSION: Enteric tube with tip in the proximal stomach. Persistent air distended loops of small bowel measuring up to 5 cm in the left lower quadrant. Continued follow-up recommended. Left ureteral stent is stable in position. No stone identified along the course of the stent. Electronically Signed   By: Anner Crete M.D.   On: 09/02/2016 23:05   Dg Abd Acute W/chest  Result Date: 09/02/2016 CLINICAL DATA:  Ileus EXAM: DG ABDOMEN ACUTE W/ 1V CHEST COMPARISON:  CT abdomen pelvis 08/30/2016 FINDINGS: There is shallow lung inflation with a large hiatal hernia noted. There is aortic atherosclerotic calcification in the arch. Small bilateral pleural effusions, right greater than left. Associated bibasilar atelectasis. A a left nephroureteral stent extends from the left renal shadow to the expected location of the urinary bladder. There is a drainage catheter overlying the pelvis. There are multiple dilated loops of gas-filled bowel with associated wall edema. No small bowel fluid levels are identified. No free intraperitoneal air. IMPRESSION: 1. Multiple dilated loops of gas-filled bowel with wall edema. Adynamic ileus is favored, though small bowel obstruction would be difficult to exclude. 2. Large hiatal hernia with bilateral small pleural  effusions and associated atelectasis. Electronically Signed   By: Ulyses Jarred M.D.   On: 09/02/2016 20:56   Dg Abd Portable 2v  Result Date: 09/03/2016 CLINICAL DATA:  Abdominal pain and nausea and vomiting EXAM: PORTABLE ABDOMEN - 2 VIEW COMPARISON:  09/02/2016 FINDINGS: Nasogastric catheter is again coiled within the stomach. A left ureteral stent is again seen. Scattered  large and small bowel gas is noted. Surgical drain is noted within the pelvis. A few dilated loops of small bowel are again identified. These changes are most consistent with a postoperative ileus. No free air is seen. IMPRESSION: Stable postoperative ileus.  Continued follow-up is recommended. Electronically Signed   By: Inez Catalina M.D.   On: 09/03/2016 09:46    Assessment/Plan: 82 year old female now postoperative day #4 from a repair of a left ureteral injury. Appreciate urology assistance with this patient. Continue TPN and await return of bowel function. Encourage physical activity, incentive spirometer usage. Appreciate pharmacy assistance with TPN.   Clayburn Pert, MD FACS General Surgeon  09/04/2016

## 2016-09-04 NOTE — Progress Notes (Addendum)
[place] NOTE   Pharmacy Consult for TPN  Indication: Prolonged ileus    Patient Measurements: Height: 5\' 4"  (162.6 cm) Weight: 180 lb 12.8 oz (82 kg) IBW/kg (Calculated) : 54.7 TPN AdjBW (KG): 59.7 Body mass index is 31.03 kg/m. Usual Weight:   Assessment: Pharmacy consulted to assist in the management of electrolytes and glucose in this  82 year old woman receiving TPN.   All electrolytes wnl except Magnesium and Phos.   GI:  Endo:  Insulin requirements in the past 24 hours: 9 units  Lytes: Renal: Pulm: Cards:  Hepatobil: Neuro: ID:  Best Practices: TPN Access: TPN start date: 12/21  Nutritional Goals (per RD recommendation on 2223 calories ): kCal: Protein: 110 gm   Current Nutrition:   Plan:   Clinimix E5/15  at 60 ml/hr  20% lipid emulsion at 44ml/hr for 12 hours daily    10 ml  MVI, 1 ml trace elements, 100mg  thiamine in TPN  Will also order SSI q6 hours.   12/22 2223 phos 1.9. Will give potassium phosphate 20 mmol IV x 1 and recheck electrolytes in the morning after dose finished. Ebbie Latus, Pharm.D., BCPS  Loree Fee, PharmD 09/04/2016,2:47 PM

## 2016-09-05 ENCOUNTER — Inpatient Hospital Stay: Payer: Medicare Other

## 2016-09-05 DIAGNOSIS — D649 Anemia, unspecified: Secondary | ICD-10-CM

## 2016-09-05 LAB — GLUCOSE, CAPILLARY
GLUCOSE-CAPILLARY: 153 mg/dL — AB (ref 65–99)
GLUCOSE-CAPILLARY: 133 mg/dL — AB (ref 65–99)
GLUCOSE-CAPILLARY: 138 mg/dL — AB (ref 65–99)
GLUCOSE-CAPILLARY: 159 mg/dL — AB (ref 65–99)
Glucose-Capillary: 129 mg/dL — ABNORMAL HIGH (ref 65–99)
Glucose-Capillary: 140 mg/dL — ABNORMAL HIGH (ref 65–99)
Glucose-Capillary: 150 mg/dL — ABNORMAL HIGH (ref 65–99)

## 2016-09-05 LAB — CULTURE, BLOOD (ROUTINE X 2)
CULTURE: NO GROWTH
CULTURE: NO GROWTH

## 2016-09-05 LAB — CBC
HCT: 22.5 % — ABNORMAL LOW (ref 35.0–47.0)
Hemoglobin: 7.7 g/dL — ABNORMAL LOW (ref 12.0–16.0)
MCH: 28 pg (ref 26.0–34.0)
MCHC: 34.2 g/dL (ref 32.0–36.0)
MCV: 82 fL (ref 80.0–100.0)
PLATELETS: 353 10*3/uL (ref 150–440)
RBC: 2.75 MIL/uL — ABNORMAL LOW (ref 3.80–5.20)
RDW: 14.4 % (ref 11.5–14.5)
WBC: 18.6 10*3/uL — AB (ref 3.6–11.0)

## 2016-09-05 LAB — BASIC METABOLIC PANEL
Anion gap: 4 — ABNORMAL LOW (ref 5–15)
BUN: 8 mg/dL (ref 6–20)
CO2: 28 mmol/L (ref 22–32)
CREATININE: 0.35 mg/dL — AB (ref 0.44–1.00)
Calcium: 6.9 mg/dL — ABNORMAL LOW (ref 8.9–10.3)
Chloride: 103 mmol/L (ref 101–111)
Glucose, Bld: 145 mg/dL — ABNORMAL HIGH (ref 65–99)
Potassium: 3.8 mmol/L (ref 3.5–5.1)
SODIUM: 135 mmol/L (ref 135–145)

## 2016-09-05 LAB — PHOSPHORUS: PHOSPHORUS: 2.4 mg/dL — AB (ref 2.5–4.6)

## 2016-09-05 LAB — MAGNESIUM: MAGNESIUM: 1.7 mg/dL (ref 1.7–2.4)

## 2016-09-05 LAB — PREPARE RBC (CROSSMATCH)

## 2016-09-05 MED ORDER — FAT EMULSION 20 % IV EMUL
250.0000 mL | INTRAVENOUS | Status: AC
  Filled 2016-09-05: qty 500

## 2016-09-05 MED ORDER — MORPHINE SULFATE (PF) 4 MG/ML IV SOLN
2.0000 mg | Freq: Once | INTRAVENOUS | Status: AC
  Administered 2016-09-05: 2 mg via INTRAVENOUS
  Filled 2016-09-05: qty 1

## 2016-09-05 MED ORDER — FAT EMULSION 20 % IV EMUL
250.0000 mL | INTRAVENOUS | Status: DC
Start: 2016-09-05 — End: 2016-09-05
  Filled 2016-09-05: qty 250

## 2016-09-05 MED ORDER — DEXTROSE 5 % IV SOLN
20.0000 mmol | Freq: Once | INTRAVENOUS | Status: AC
  Administered 2016-09-05: 20 mmol via INTRAVENOUS
  Filled 2016-09-05: qty 6.67

## 2016-09-05 MED ORDER — MAGNESIUM SULFATE 2 GM/50ML IV SOLN
2.0000 g | Freq: Once | INTRAVENOUS | Status: AC
  Administered 2016-09-05: 2 g via INTRAVENOUS
  Filled 2016-09-05: qty 50

## 2016-09-05 MED ORDER — TRACE MINERALS CR-CU-MN-SE-ZN 10-1000-500-60 MCG/ML IV SOLN
INTRAVENOUS | Status: AC
  Filled 2016-09-05: qty 1992

## 2016-09-05 MED ORDER — POTASSIUM PHOSPHATES 15 MMOLE/5ML IV SOLN
10.0000 mmol | Freq: Once | INTRAVENOUS | Status: DC
  Filled 2016-09-05: qty 3.33

## 2016-09-05 MED ORDER — KETOROLAC TROMETHAMINE 15 MG/ML IJ SOLN
30.0000 mg | Freq: Four times a day (QID) | INTRAMUSCULAR | Status: DC
  Administered 2016-09-05 – 2016-09-08 (×12): 30 mg via INTRAVENOUS
  Filled 2016-09-05 (×10): qty 2

## 2016-09-05 MED ORDER — POTASSIUM PHOSPHATES 15 MMOLE/5ML IV SOLN
15.0000 mmol | Freq: Once | INTRAVENOUS | Status: AC
  Administered 2016-09-05: 15 mmol via INTRAVENOUS
  Filled 2016-09-05: qty 5

## 2016-09-05 MED ORDER — TRACE MINERALS CR-CU-MN-SE-ZN 10-1000-500-60 MCG/ML IV SOLN: INTRAVENOUS | Status: DC

## 2016-09-05 MED ORDER — SODIUM CHLORIDE 0.9 % IV SOLN: Freq: Once | INTRAVENOUS | Status: DC

## 2016-09-05 NOTE — Plan of Care (Signed)
Problem: Pain Managment: Goal: General experience of comfort will improve Outcome: Not Progressing Patient has pain q3h, with minimal relief from pain medication.  Problem: Fluid Volume: Goal: Ability to maintain a balanced intake and output will improve Outcome: Not Progressing Patient is edematous.  Problem: Bowel/Gastric: Goal: Gastrointestinal status for postoperative course will improve Outcome: Not Progressing No bowel movement since 8/17, encourage activity and decrease frequency of pain medication.

## 2016-09-05 NOTE — Progress Notes (Signed)
5 Days Post-Op   Subjective:  Patient reports that she is starting to feel more lower abdominal pain along her incision site. She denies passing anything per rectum. Continues to feel weak.  Vital signs in last 24 hours: Temp:  [98.6 F (37 C)-99.2 F (37.3 C)] 98.6 F (37 C) (12/23 0800) Pulse Rate:  [54-128] 91 (12/23 1114) Resp:  [16-20] 20 (12/23 0800) BP: (128-150)/(53-60) 142/53 (12/23 0800) SpO2:  [91 %-96 %] 91 % (12/23 1114) Weight:  [82 kg (180 lb 11.9 oz)] 82 kg (180 lb 11.9 oz) (12/23 0351) Last BM Date: 08/30/16  Intake/Output from previous day: 12/22 0701 - 12/23 0700 In: 1974.7 [I.V.:1368; IV Piggyback:606.7] Out: 1115 [Urine:950; Drains:165]  GI: Abdomen soft, appropriately tender to palpation at the incision sites, nondistended.  Lab Results:  CBC  Recent Labs  09/03/16 0219 09/05/16 0618  WBC 28.1* 18.6*  HGB 8.5* 7.7*  HCT 26.1* 22.5*  PLT 339 353   CMP     Component Value Date/Time   NA 135 09/05/2016 0618   K 3.8 09/05/2016 0618   CL 103 09/05/2016 0618   CO2 28 09/05/2016 0618   GLUCOSE 145 (H) 09/05/2016 0618   BUN 8 09/05/2016 0618   CREATININE 0.35 (L) 09/05/2016 0618   CALCIUM 6.9 (L) 09/05/2016 0618   PROT 4.7 (L) 09/04/2016 0518   ALBUMIN 1.3 (L) 09/04/2016 0518   AST 22 09/04/2016 0518   ALT 9 (L) 09/04/2016 0518   ALKPHOS 71 09/04/2016 0518   BILITOT 0.7 09/04/2016 0518   GFRNONAA >60 09/05/2016 0618   GFRAA >60 09/05/2016 0618   PT/INR No results for input(s): LABPROT, INR in the last 72 hours.  Studies/Results: Dg Abd Portable 2v  Result Date: 09/05/2016 CLINICAL DATA:  Ileus. EXAM: PORTABLE ABDOMEN - 2 VIEW COMPARISON:  09/03/2016 FINDINGS: Right-sided PICC line in adequate position. Nasogastric tube unchanged coiled once over a known large hiatal hernia. Left sided double-J internal ureteral stent unchanged. Surgical drain over the left pelvis unchanged. Horizontal skin staples over the pelvis. A few surgical clips over  the left pelvis. Persistent opacification in the lung bases. There are several persistent air-filled dilated small bowel loops over the left abdomen without significant change likely postoperative ileus. Remainder of the exam is unchanged. IMPRESSION: Persistent air-filled dilated small bowel loops in the left abdomen likely postoperative ileus. Multiple tubes and lines as described. Postsurgical change as described. Electronically Signed   By: Marin Olp M.D.   On: 09/05/2016 09:12    Assessment/Plan: 82 year old female now status post repair of a left ureteral injury. NG tube output has decreased markedly and will be removed this morning. Encourage pulmonary toilet and activity as tolerated. Discussed case with urology and we will plan to transfuse patient for anemia this morning. Appreciate urology and pharmacy assistance with this complex patient. Continue TPN per pharmacy while awaiting return of bowel function.   Clayburn Pert, MD FACS General Surgeon  09/05/2016

## 2016-09-05 NOTE — Progress Notes (Signed)
Nutrition Follow-up  DOCUMENTATION CODES:   Not applicable  INTERVENTION:  1. Recommend increase 5%AA/15%Dextrose w/ MVI/trace minerals to goal of 36mL/hr w/ infusion of 20% lipids daily @ 89mL/hr for 12hrs Provides 100gm protein, 1894 calories, 2000cc fluid. 2. Recommend continue to replace Phosphorus, Magnesium -> Phos 2.4 today, Mg 1.7 3. Recommend additional thiamine of 50mg  IV BID with risk of refeeding syndrome. 4. Patient continues on LR @ 64mL/hr, D5 has been D/C  NUTRITION DIAGNOSIS:   Inadequate oral intake related to inability to eat as evidenced by NPO status. -ongoing  GOAL:   Patient will meet greater than or equal to 90% of their needs -will meet with TPN @ goal  MONITOR:   Diet advancement, Labs, Weight trends, I & O's  REASON FOR ASSESSMENT:   Consult New TPN/TNA  ASSESSMENT:   This patient was possibly a week out from a right colon resection for colon cancer and concomitant hysterectomy and nephrectomy. She was in her improving state of health at home but noticed worsening abdominal pain at first not controlled by Tylenol then not controlled by Vicodin either therefore 911 was called.  Needs re-estimated and formulary changed yesterday. Patient continues to tolerate TPN Exhibits generalized edema. Weight up 7.5 kgs from 12/18  Intake/Output Summary (Last 24 hours) at 09/05/16 1308 Last data filed at 09/05/16 0700  Gross per 24 hour  Intake          1974.67 ml  Output              715 ml  Net          1259.67 ml   Labs and medications reviewed: CBGs 129-140, Phos 2.4  Diet Order:  Diet NPO time specified TPN (CLINIMIX) Adult without lytes TPN (CLINIMIX) Adult without lytes  Skin:  Wound (see comment) (Abdominal surgical wounds)  Last BM:  12/23  Height:   Ht Readings from Last 1 Encounters:  08/23/2016 5\' 4"  (1.626 m)    Weight:   Wt Readings from Last 1 Encounters:  09/05/16 180 lb 11.9 oz (82 kg)    Ideal Body Weight:  54.54  kg  BMI:  Body mass index is 31.03 kg/m.  Estimated Nutritional Needs:   Kcal:  [id number] kcals  Protein:  94-113 g  Fluid:  >/= 1.8 L  EDUCATION NEEDS:   No education needs identified at this time  Satira Anis. [place] Mokry, MS, RD LDN Inpatient Clinical Dietitian Pager [phone]

## 2016-09-05 NOTE — Clinical Social Work Note (Signed)
CSW spoke with patient's daughter Joellen Jersey about possible SNF placement. Joellen Jersey indicated that her family is continuing to discuss if that will be their choice or if they will bring her home. She asked not to have the referral sent until the family has fully discussed. CSW con't to follow.  Santiago Bumpers, MSW, LCSW-A [phone]

## 2016-09-05 NOTE — Progress Notes (Signed)
Ng tube d/c'd and pt felt no nausea all day. Has congested cough and raising well. Afebrile. Family at bedside. ptusing incentivespirometer well. Tele s.tach. hgb 7.7. Pt transfused with one unit rbc's. tpn with lipids infusing. d5lr  Going into picc. Foley draining amber. Good pain control with tramadol. Pt remains a/o.

## 2016-09-05 NOTE — Progress Notes (Addendum)
[place] NOTE   Pharmacy Consult for TPN  Indication: Prolonged ileus    Patient Measurements: Height: 5\' 4"  (162.6 cm) Weight: 180 lb 11.9 oz (82 kg) IBW/kg (Calculated) : 54.7 TPN AdjBW (KG): 59.7 Body mass index is 31.03 kg/m. Usual Weight:   Assessment: Pharmacy consulted to assist in the management of electrolytes and glucose in this  82 year old woman receiving TPN for postop ileus.  K+= 3.8 Mag= 1.7 (borderline) Phos=2.4 Ca= 6.9  Albumin=1.3   *Alb.Corrected Ca=9.06*   GI: NPO Endo:  Insulin requirements in the past 24 hours: 15 units  Lytes: Renal: Scr 0.35 Pulm: Cards:  Hepatobil: Neuro: ID:  Best Practices: TPN Access: TPN start date: 12/21  Nutritional Goals (per RD recommendation on 2223 calories ): kCal: Protein: 110 gm   Current Nutrition: NPO, TPN  Plan:   -Continue Clinimix * No Electrolytes * 5/15 and increase to goal rate of  83 ml/hr with added MVI, trace and Thiamine 100mg . -20% lipid emulsion at 6ml/hr for 12 hours daily  -on SSI q4 hours.   12/22 0618   Will give potassium phosphate 15 mmol IV x 1 and Magnesium 2 gram IV x 1.  Recheck electrolytes in am.   Noralee Space, PharmD 09/05/2016,8:23 AM

## 2016-09-05 NOTE — Progress Notes (Signed)
Notified physician of  break through pain. Patient is having lower abdominal pain at the incision site. Received orders for one time dose of morphine.

## 2016-09-05 NOTE — Progress Notes (Signed)
Physical Therapy Treatment Patient Details Name: Katherine Medina MRN: [id number] DOB: 09/10/1929 Today's Date: 09/05/2016    History of Present Illness      PT Comments    Pt agreeable to PT; denies pain. Pt states she is "so, so". Pt participates in supine bed exercises with re education and education to daughter who is eager to learn. Added to home exercise program provided by last therapist. Pt gives good effort, but does fatigue quickly. Education provided on progression of repetitions and frequency for all exercises. Pt requires Max A for bed mobility, but does demonstrate good effort to assist mobility. Pt unable to find COG in sitting, but able to hold neutral posture for several seconds if assisted to upright posture. Pt fatigues very quickly with need to lie back in 1 minute; pt notes feeling sick, no events and pt symptoms improve with lying down. Encouraged minimal recline in bed at times to encourage productive cough and improved breathing. Continue PT to progress strength, endurance and seated balance to improve functional mobility.   Follow Up Recommendations  SNF     Equipment Recommendations       Recommendations for Other Services       Precautions / Restrictions Precautions Precautions: Fall Restrictions Weight Bearing Restrictions: No    Mobility  Bed Mobility Overal bed mobility: Needs Assistance Bed Mobility: Supine to Sit;Sit to Supine     Supine to sit: Max assist Sit to supine: Max assist   General bed mobility comments: Pt given good effort, but continues to require Max A; Max A x 2 for repositioning upward in bed  Transfers                 General transfer comment: Unable; only tolerates sitting x 1 min with assist  Ambulation/Gait                 Stairs            Wheelchair Mobility    Modified Rankin (Stroke Patients Only)       Balance Overall balance assessment: Needs assistance Sitting-balance support: Feet  supported;Bilateral upper extremity supported Sitting balance-Leahy Scale: Poor Sitting balance - Comments: R posterolateral lean; requires encouragement and cues for righting. Unable to right self, but able to hold seated position for a few seconds before losing balance Postural control: Posterior lean;Right lateral lean                          Cognition Arousal/Alertness: Awake/alert (Fatigued) Behavior During Therapy: WFL for tasks assessed/performed Overall Cognitive Status: Within Functional Limits for tasks assessed                      Exercises General Exercises - Lower Extremity Ankle Circles/Pumps: AROM;Both;20 reps;Supine Quad Sets: Strengthening;Both;20 reps;Supine Gluteal Sets: Strengthening;Both;20 reps;Supine Short Arc Quad: AROM;Both;10 reps;Supine Heel Slides: AROM;Both;10 reps;Supine Hip ABduction/ADduction: AROM;Both;10 reps;Supine Straight Leg Raises: AAROM;10 reps;Both;Supine    General Comments        Pertinent Vitals/Pain Pain Assessment: No/denies pain    Home Living                      Prior Function            PT Goals (current goals can now be found in the care plan section) Progress towards PT goals: Progressing toward goals    Frequency    Min 2X/week  PT Plan Current plan remains appropriate    Co-evaluation             End of Session Equipment Utilized During Treatment: Oxygen Activity Tolerance: Patient limited by fatigue;Other (comment) (weakness; poor endurance) Patient left: in bed;with call bell/phone within reach;with bed alarm set;with SCD's reapplied;with family/visitor present     Time: [id number] PT Time Calculation (min) (ACUTE ONLY): 34 min  Charges:  $Therapeutic Exercise: 8-22 mins $Therapeutic Activity: 8-22 mins                    G CodesLarae Grooms, PTA 09/05/2016, 11:50 AM

## 2016-09-05 NOTE — Progress Notes (Signed)
5 Days Post-Op  Subjective:  1 - Left Ureteral Injury - s/p open LEFT ureteral reimplant with bilat retrograde, and left JJ stent 12/18 by Louis Meckel as primary repair of delayed left ureteral injury after hemicolectomy. KUB 12/23 with JJ stent and surgical drain in good position.  2 - Anemia - hgb 7.7 noted 12/23 with some tachycardia. No grossly bloody output from foley / surgical drain / or per rectum. She has been in / out hospital with difficult nutrition for several weeks.   Today "Katherine Medina" is stable. Now on TPN as prolonged ileus. Anemia with Hgb 7.7 and tachycardia noted today. No grossly bloody output from foley / surgical drain / or per rectum.  Objective: Vital signs in last 24 hours: Temp:  [98.1 F (36.7 C)-99.2 F (37.3 C)] 98.6 F (37 C) (12/23 0800) Pulse Rate:  [54-128] 128 (12/23 0800) Resp:  [16-20] 20 (12/23 0800) BP: (122-150)/(49-60) 142/53 (12/23 0800) SpO2:  [91 %-96 %] 91 % (12/23 0800) Weight:  [82 kg (180 lb 11.9 oz)] 82 kg (180 lb 11.9 oz) (12/23 0351) Last BM Date: 08/30/16  Intake/Output from previous day: 12/22 0701 - 12/23 0700 In: 1974.7 [I.V.:1368; IV Piggyback:606.7] Out: 1115 [Urine:950; Drains:165] Intake/Output this shift: No intake/output data recorded.  General appearance: alert and fatigued and pale. Very pleasant. Numerous family members at bedside who are very helpful in providing additional history.  Eyes: negative Nose: Nares normal. Septum midline. Mucosa normal. No drainage or sinus tenderness. Throat: lips, mucosa, and tongue normal; teeth and gums normal Neck: supple, symmetrical, trachea midline Back: symmetric, no curvature. ROM normal. No CVA tenderness. Resp: non-labored Cardio: regular tachycardia. GI: soft, non-tender; bowel sounds normal; no masses,  no organomegaly Extremities: extremities normal, atraumatic, no cyanosis or edema and RUE PICC noted w/o hematomas / erytheam.  Skin: Skin color, texture, turgor normal. No rashes  or lesions Lymph nodes: Cervical, supraclavicular, and axillary nodes normal. Neurologic: Grossly normal Incision/Wound: Recent lower abd incision c/d/i. LQL JP with serous output. Foley with medium yellow urine and some mild debris.   Lab Results:   Recent Labs  09/03/16 0219 09/05/16 0618  WBC 28.1* 18.6*  HGB 8.5* 7.7*  HCT 26.1* 22.5*  PLT 339 353   BMET  Recent Labs  09/04/16 0518 09/05/16 0618  NA 137 135  K 3.6 3.8  CL 106 103  CO2 27 28  GLUCOSE 140* 145*  BUN 9 8  CREATININE 0.56 0.35*  CALCIUM 7.3* 6.9*   PT/INR No results for input(s): LABPROT, INR in the last 72 hours. ABG No results for input(s): PHART, HCO3 in the last 72 hours.  Invalid input(s): PCO2, PO2  Studies/Results: Dg Abd Portable 2v  Result Date: 09/05/2016 CLINICAL DATA:  Ileus. EXAM: PORTABLE ABDOMEN - 2 VIEW COMPARISON:  09/03/2016 FINDINGS: Right-sided PICC line in adequate position. Nasogastric tube unchanged coiled once over a known large hiatal hernia. Left sided double-J internal ureteral stent unchanged. Surgical drain over the left pelvis unchanged. Horizontal skin staples over the pelvis. A few surgical clips over the left pelvis. Persistent opacification in the lung bases. There are several persistent air-filled dilated small bowel loops over the left abdomen without significant change likely postoperative ileus. Remainder of the exam is unchanged. IMPRESSION: Persistent air-filled dilated small bowel loops in the left abdomen likely postoperative ileus. Multiple tubes and lines as described. Postsurgical change as described. Electronically Signed   By: Marin Olp M.D.   On: 09/05/2016 09:12    Anti-infectives: Anti-infectives  Start     Dose/Rate Route Frequency Ordered Stop   08/30/2016 0900  piperacillin-tazobactam (ZOSYN) IVPB 3.375 g  Status:  Discontinued     3.375 g 12.5 mL/hr over 240 Minutes Intravenous Every 8 hours 09/04/2016 0337 09/12/2016 0434   08/30/2016 0900   piperacillin-tazobactam (ZOSYN) IVPB 3.375 g     3.375 g 12.5 mL/hr over 240 Minutes Intravenous Every 8 hours 08/29/2016 0434        Assessment/Plan:  1 - Left Ureteral Injury - stable from GU perspective. Would keep all current tubes / drains until ileus resolved, then consider sequential removal. We will manage.  2 - Anemia - likely chronic disease related. Discussed with general surgery and we both agree on [facility] today.   Will follow, please call me directly with questions.   [facility], Akeia Perot 09/05/2016

## 2016-09-05 NOTE — Progress Notes (Signed)
Blood transfusion completed amount infused 366ml. NS flushed 150ml. No adverse reactions observed. Will continue to monitor pt.

## 2016-09-06 LAB — CBC
HEMATOCRIT: 24.8 % — AB (ref 35.0–47.0)
HEMOGLOBIN: 8.3 g/dL — AB (ref 12.0–16.0)
MCH: 27.4 pg (ref 26.0–34.0)
MCHC: 33.3 g/dL (ref 32.0–36.0)
MCV: 82.3 fL (ref 80.0–100.0)
Platelets: 342 10*3/uL (ref 150–440)
RBC: 3.01 MIL/uL — AB (ref 3.80–5.20)
RDW: 14.1 % (ref 11.5–14.5)
WBC: 17.7 10*3/uL — ABNORMAL HIGH (ref 3.6–11.0)

## 2016-09-06 LAB — GLUCOSE, CAPILLARY
GLUCOSE-CAPILLARY: 145 mg/dL — AB (ref 65–99)
GLUCOSE-CAPILLARY: 146 mg/dL — AB (ref 65–99)
GLUCOSE-CAPILLARY: 146 mg/dL — AB (ref 65–99)
GLUCOSE-CAPILLARY: 166 mg/dL — AB (ref 65–99)
Glucose-Capillary: 161 mg/dL — ABNORMAL HIGH (ref 65–99)
Glucose-Capillary: >600 mg/dL (ref 65–99)
Glucose-Capillary: 169 mg/dL — ABNORMAL HIGH (ref 65–99)

## 2016-09-06 LAB — HEMOGLOBIN AND HEMATOCRIT, BLOOD
HEMATOCRIT: 24.5 % — AB (ref 35.0–47.0)
Hemoglobin: 8.2 g/dL — ABNORMAL LOW (ref 12.0–16.0)

## 2016-09-06 LAB — BASIC METABOLIC PANEL
ANION GAP: 4 — AB (ref 5–15)
BUN: 10 mg/dL (ref 6–20)
CALCIUM: 6.9 mg/dL — AB (ref 8.9–10.3)
CO2: 28 mmol/L (ref 22–32)
Chloride: 104 mmol/L (ref 101–111)
Creatinine, Ser: 0.42 mg/dL — ABNORMAL LOW (ref 0.44–1.00)
GLUCOSE: 152 mg/dL — AB (ref 65–99)
POTASSIUM: 3.4 mmol/L — AB (ref 3.5–5.1)
Sodium: 136 mmol/L (ref 135–145)

## 2016-09-06 LAB — PHOSPHORUS
PHOSPHORUS: 1.8 mg/dL — AB (ref 2.5–4.6)
PHOSPHORUS: 2.1 mg/dL — AB (ref 2.5–4.6)

## 2016-09-06 LAB — POTASSIUM: Potassium: 3.4 mmol/L — ABNORMAL LOW (ref 3.5–5.1)

## 2016-09-06 LAB — ALBUMIN: ALBUMIN: 1.3 g/dL — AB (ref 3.5–5.0)

## 2016-09-06 LAB — MAGNESIUM: MAGNESIUM: 1.7 mg/dL (ref 1.7–2.4)

## 2016-09-06 MED ORDER — POTASSIUM CHLORIDE 20 MEQ/15ML (10%) PO SOLN
20.0000 meq | Freq: Once | ORAL | Status: AC
  Administered 2016-09-06: 20 meq via ORAL
  Filled 2016-09-06: qty 15

## 2016-09-06 MED ORDER — MAGNESIUM SULFATE 2 GM/50ML IV SOLN
2.0000 g | Freq: Once | INTRAVENOUS | Status: AC
  Administered 2016-09-06: 2 g via INTRAVENOUS
  Filled 2016-09-06: qty 50

## 2016-09-06 MED ORDER — FAT EMULSION 20 % IV EMUL
250.0000 mL | INTRAVENOUS | Status: AC
  Filled 2016-09-06: qty 500

## 2016-09-06 MED ORDER — POTASSIUM PHOSPHATES 15 MMOLE/5ML IV SOLN
30.0000 mmol | Freq: Once | INTRAVENOUS | Status: AC
  Administered 2016-09-06: 30 mmol via INTRAVENOUS
  Filled 2016-09-06: qty 10

## 2016-09-06 MED ORDER — POTASSIUM PHOSPHATES 15 MMOLE/5ML IV SOLN
20.0000 mmol | Freq: Once | INTRAVENOUS | Status: AC
  Administered 2016-09-06: 20 mmol via INTRAVENOUS
  Filled 2016-09-06: qty 6.67

## 2016-09-06 MED ORDER — TRACE MINERALS CR-CU-MN-SE-ZN 10-1000-500-60 MCG/ML IV SOLN
INTRAVENOUS | Status: AC
  Administered 2016-09-06: via INTRAVENOUS
  Filled 2016-09-06: qty 1992

## 2016-09-06 NOTE — Progress Notes (Addendum)
[facility] CONSULT NOTE   Pharmacy Consult for TPN  Indication: Prolonged ileus    Patient Measurements: Height: 5\' 4"  (162.6 cm) Weight: 180 lb 1.6 oz (81.7 kg) IBW/kg (Calculated) : 54.7 TPN AdjBW (KG): 59.7 Body mass index is 30.91 kg/m. Usual Weight:   Assessment: Pharmacy consulted to assist in the management of electrolytes and glucose in this  82 year old woman receiving TPN for postop ileus.  K+= 3.4 Mag= 1.7 (borderline) Phos=1.8 Ca= 6.9  Albumin=1.3   *Alb.Corrected Ca=9.06*   GI: NPO Endo:  Insulin requirements in the past 24 hours: 14 units  Lytes: Renal: Scr 0.35 Pulm: Cards:  Hepatobil: Neuro: ID:  Best Practices: TPN Access: TPN start date: 12/21  Nutritional Goals (per RD recommendation on 2223 calories ): kCal: Protein: 110 gm   Current Nutrition: NPO, TPN  Plan:   -Continue Clinimix * No Electrolytes * 5/15 at goal rate of  83 ml/hr with added MVI, trace and Thiamine 100mg  per bag. -20% lipid emulsion at 2ml/hr for 12 hours daily  -on SSI q4 hours.   12/23 0618   Will give potassium phosphate 15 mmol IV x 1 and Magnesium 2 gram IV x 1.  Recheck electrolytes in am.  12/24 Will order Potassium Phosphate 30 mmol IV x 1 (K+= 44 meq ) and Magnesium 2 gram IV x 1.  Will recheck Phos and K+ at 2000 and in am.  12/24 1959 K 3.4, phos 2.1 Will give potassium phosphate 20 mmol IV x 1 and potassium chloride 20 mEq oral solution x 1, recheck electrolytes after infusion. -NAC  Merrill,Kristin A, PharmD 09/06/2016,7:50 AM

## 2016-09-06 NOTE — Progress Notes (Signed)
6 Days Post-Op   Subjective:  82 year old female asked that patient reports feeling better this morning. Has had a bowel movement. States she continues to not be hungry now.  Vital signs in last 24 hours: Temp:  [97.7 F (36.5 C)-99.2 F (37.3 C)] 97.7 F (36.5 C) (12/24 0800) Pulse Rate:  [85-111] 85 (12/24 0800) Resp:  [14-18] 16 (12/24 0800) BP: (124-150)/(44-64) 145/44 (12/24 0800) SpO2:  [91 %-97 %] 93 % (12/24 0800) Weight:  [81.7 kg (180 lb 1.6 oz)] 81.7 kg (180 lb 1.6 oz) (12/24 0506) Last BM Date: 09/06/16  Intake/Output from previous day: 12/23 0701 - 12/24 0700 In: 2583 [I.V.:1733; Blood:680; IV Piggyback:100] Out: 3200 [Urine:3000; Drains:200]  GI: Abdomen soft, appropriately tender to palpation at her incision site, nondistended JP drain with a serous output.  Lab Results:  CBC  Recent Labs  09/05/16 0618 09/06/16 0025 09/06/16 0532  WBC 18.6*  --  17.7*  HGB 7.7* 8.2* 8.3*  HCT 22.5* 24.5* 24.8*  PLT 353  --  342   CMP     Component Value Date/Time   NA 136 09/06/2016 0532   K 3.4 (L) 09/06/2016 0532   CL 104 09/06/2016 0532   CO2 28 09/06/2016 0532   GLUCOSE 152 (H) 09/06/2016 0532   BUN 10 09/06/2016 0532   CREATININE 0.42 (L) 09/06/2016 0532   CALCIUM 6.9 (L) 09/06/2016 0532   PROT 4.7 (L) 09/04/2016 0518   ALBUMIN 1.3 (L) 09/06/2016 0532   AST 22 09/04/2016 0518   ALT 9 (L) 09/04/2016 0518   ALKPHOS 71 09/04/2016 0518   BILITOT 0.7 09/04/2016 0518   GFRNONAA >60 09/06/2016 0532   GFRAA >60 09/06/2016 0532   PT/INR No results for input(s): LABPROT, INR in the last 72 hours.  Studies/Results: Dg Abd Portable 2v  Result Date: 09/05/2016 CLINICAL DATA:  Ileus. EXAM: PORTABLE ABDOMEN - 2 VIEW COMPARISON:  09/03/2016 FINDINGS: Right-sided PICC line in adequate position. Nasogastric tube unchanged coiled once over a known large hiatal hernia. Left sided double-J internal ureteral stent unchanged. Surgical drain over the left pelvis unchanged.  Horizontal skin staples over the pelvis. A few surgical clips over the left pelvis. Persistent opacification in the lung bases. There are several persistent air-filled dilated small bowel loops over the left abdomen without significant change likely postoperative ileus. Remainder of the exam is unchanged. IMPRESSION: Persistent air-filled dilated small bowel loops in the left abdomen likely postoperative ileus. Multiple tubes and lines as described. Postsurgical change as described. Electronically Signed   By: Marin Olp M.D.   On: 09/05/2016 09:12    Assessment/Plan: 82 year old female 6 days post open repair of left ureteral injury. Continues to show improvement. Will order a clear liquid diet today but counseled patient to go slowly she is still on TPN and she still does not have an appetite. Encourage physical activity, incentive spirometer usage. Appreciate urology assistance with this complicated patient.   Clayburn Pert, MD FACS General Surgeon  09/06/2016

## 2016-09-06 NOTE — Progress Notes (Addendum)
Pt. Has slept throughout the night with no c/o pain, SOB or acute distress noted, pt. Continues to have strong productive cough, she continues to cough and deep breath and use incentive spirometer. [place] continues to RUE and foley to straight drain continues. Pt. A&O, VSS. Daughter at bedside. Ran NSR/ST throughout the night with HR running in 80's to low 100's.

## 2016-09-06 NOTE — Progress Notes (Signed)
6 Days Post-Op  Subjective:  1 - Left Ureteral Injury - s/p open LEFT ureteral reimplant with bilat retrograde, and left JJ stent 12/18 by Louis Meckel as primary repair of delayed left ureteral injury after hemicolectomy. KUB 12/23 with JJ stent and surgical drain in good position.  2 - Anemia - hgb 7.7 noted 12/23 with some tachycardia to 130s. No grossly bloody output from foley / surgical drain / or per rectum. She has been in / out hospital with difficult nutrition for several weeks. 1u pRBC 12/23 with dramatic reduction in tachycardia and improved energy and Hgb mid 8's.   Today "Jurlene" is stable. Feels much better after blood yesterday. Had large BM this AM too which is good.   Objective: Vital signs in last 24 hours: Temp:  [97.7 F (36.5 C)-99.2 F (37.3 C)] 97.7 F (36.5 C) (12/24 0800) Pulse Rate:  [85-111] 85 (12/24 0800) Resp:  [14-18] 16 (12/24 0800) BP: (124-150)/(44-64) 145/44 (12/24 0800) SpO2:  [91 %-97 %] 93 % (12/24 0800) Weight:  [81.7 kg (180 lb 1.6 oz)] 81.7 kg (180 lb 1.6 oz) (12/24 0506) Last BM Date: 08/30/16  Intake/Output from previous day: 12/23 0701 - 12/24 0700 In: 2583 [I.V.:1733; Blood:680; IV Piggyback:100] Out: 3200 [Urine:3000; Drains:200] Intake/Output this shift: No intake/output data recorded.  General appearance: alert, cooperative and appears stated age Eyes: negative Nose: Nares normal. Septum midline. Mucosa normal. No drainage or sinus tenderness. Throat: lips, mucosa, and tongue normal; teeth and gums normal Neck: supple, symmetrical, trachea midline Back: symmetric, no curvature. ROM normal. No CVA tenderness. Resp: non-labored Cardio: improved tachycardia.  GI: soft, non-tender; bowel sounds normal; no masses,  no organomegaly Female genitalia: normal, foley c/d/i wtih mildly proteinacious urine that is nonfoul.  Extremities: extremities normal, atraumatic, no cyanosis or edema Skin: Skin color, texture, turgor normal. No rashes or  lesions Lymph nodes: Cervical, supraclavicular, and axillary nodes normal. Neurologic: Grossly normal  RUE PICC in place w/o cords / erythema. Family at bedside. Pr is much more vigorous and conversant today.   Lab Results:   Recent Labs  09/05/16 0618 09/06/16 0025 09/06/16 0532  WBC 18.6*  --  17.7*  HGB 7.7* 8.2* 8.3*  HCT 22.5* 24.5* 24.8*  PLT 353  --  342   BMET  Recent Labs  09/05/16 0618 09/06/16 0532  NA 135 136  K 3.8 3.4*  CL 103 104  CO2 28 28  GLUCOSE 145* 152*  BUN 8 10  CREATININE 0.35* 0.42*  CALCIUM 6.9* 6.9*   PT/INR No results for input(s): LABPROT, INR in the last 72 hours. ABG No results for input(s): PHART, HCO3 in the last 72 hours.  Invalid input(s): PCO2, PO2  Studies/Results: Dg Abd Portable 2v  Result Date: 09/05/2016 CLINICAL DATA:  Ileus. EXAM: PORTABLE ABDOMEN - 2 VIEW COMPARISON:  09/03/2016 FINDINGS: Right-sided PICC line in adequate position. Nasogastric tube unchanged coiled once over a known large hiatal hernia. Left sided double-J internal ureteral stent unchanged. Surgical drain over the left pelvis unchanged. Horizontal skin staples over the pelvis. A few surgical clips over the left pelvis. Persistent opacification in the lung bases. There are several persistent air-filled dilated small bowel loops over the left abdomen without significant change likely postoperative ileus. Remainder of the exam is unchanged. IMPRESSION: Persistent air-filled dilated small bowel loops in the left abdomen likely postoperative ileus. Multiple tubes and lines as described. Postsurgical change as described. Electronically Signed   By: Marin Olp M.D.   On: 09/05/2016 09:12  Anti-infectives: Anti-infectives    Start     Dose/Rate Route Frequency Ordered Stop   09/03/2016 0900  piperacillin-tazobactam (ZOSYN) IVPB 3.375 g  Status:  Discontinued     3.375 g 12.5 mL/hr over 240 Minutes Intravenous Every 8 hours 08/19/2016 0337 09/05/2016 0434    08/22/2016 0900  piperacillin-tazobactam (ZOSYN) IVPB 3.375 g     3.375 g 12.5 mL/hr over 240 Minutes Intravenous Every 8 hours 08/25/2016 0434        Assessment/Plan:  1 - Left Ureteral Injury - stable from GU perspective. Would keep all current tubes / drains until ileus completely resolved and tollerating PO well, then consider sequential removal. We will manage.  2 - Anemia - likely chronic disease related. Improved after transfusion, low threshold for further transfusion given excellent response.  Will follow, please call me directly with questions.    [facility], Roseana Rhine 09/06/2016

## 2016-09-06 NOTE — Progress Notes (Signed)
Went to blood bag to collect 2nd unit of blood for transfusion, was unable to receive the lab tech said they did not have a second unit for Katherine Medina. She was only to receive one unit. I explained I released the second unit for pick up and she re-stated there was no other units available for Katherine Medina. Hbg now at 8.2.

## 2016-09-06 NOTE — Progress Notes (Signed)
Nutrition Follow-up  DOCUMENTATION CODES:   Not applicable  INTERVENTION:   1. Continue Clinimix 5%AA/15%Dextrose w/ MVI/trace minerals, thiamine 100mg /bag- goal rate of 59mL/hr w/ infusion of 20% lipids daily @ 34mL/hr for 12hrs Provides 100gm protein, 1894 calories, 2000cc fluid. 2. Recommend continue to replace Phosphorus, Magnesium -> Phos 1.8, K 3.4, Mg 1.7-today; recommend recheck labs daily  NUTRITION DIAGNOSIS:   Inadequate oral intake related to inability to eat as evidenced by NPO status.  GOAL:   Patient will meet greater than or equal to 90% of their needs  MONITOR:   Diet advancement, Labs, Weight trends, I & O's  ASSESSMENT:   82 year old female who is status post laparoscopic hand-assisted right hemicolectomy and hysterectomy who was postop day #11 on admit. She [facility] to the [facility] emergency department with acute onset abdominal pain. Now s/p open LEFT ureteral reimplant with bilat retrograde, and left JJ stent 12/18 by Louis Meckel as primary repair of delayed left ureteral injury after hemicolectomy. KUB 12/23 with JJ stent and surgical drain in good position. Pt also with prolonged ileus.   Pt tolerating TPN at goal. Potassium and Phosphorus continue to be low today with Magnesium borderline low. Pt received potassium phosphate 30 mmol IV at 0913 today and scheduled to receive Magnesium 2 gram IV at 1100 today. Plan is to recheck labs 12/25. Recommend continue to monitor labs. Pt with anemia. Had one unit prbcs 12/23. NGT removed 12/23.     Medications reviewed and include: heparin, insulin, Mg Sulfate prn, protonix, zosyn, Kphos prn  Labs reviewed: K 3.4(L), creat 0.42(L), Ca 6.9(L) adj. 9.06 wnl, P 1.8(L), Mg 1.7 wnl, Alb 1.3(L) Wbc- 17.7(L), Hgb 8.3(L), Hct 24.8(L) CBGs- 140, 145, 152 x 24hrs  Diet Order:  Diet NPO time specified TPN (CLINIMIX) Adult without lytes TPN (CLINIMIX) Adult without lytes  Skin:  Wound (see comment) (Abdominal surgical  wounds)  Last BM:  12/23  Height:   Ht Readings from Last 1 Encounters:  08/28/2016 5\' 4"  (1.626 m)    Weight:   Wt Readings from Last 1 Encounters:  09/06/16 180 lb 1.6 oz (81.7 kg)    Ideal Body Weight:  54.54 kg  BMI:  Body mass index is 30.91 kg/m.  Estimated Nutritional Needs:   Kcal:  [id number] kcals  Protein:  94-113 g  Fluid:  >/= 1.8 L  EDUCATION NEEDS:   No education needs identified at this time  Koleen Distance, RD, [place] Pager #- [phone]

## 2016-09-07 ENCOUNTER — Inpatient Hospital Stay: Payer: Medicare Other

## 2016-09-07 ENCOUNTER — Encounter: Payer: Self-pay | Admitting: Internal Medicine

## 2016-09-07 DIAGNOSIS — J9601 Acute respiratory failure with hypoxia: Secondary | ICD-10-CM

## 2016-09-07 LAB — LACTATE DEHYDROGENASE, PLEURAL OR PERITONEAL FLUID: LD FL: 96 U/L — AB (ref 3–23)

## 2016-09-07 LAB — BLOOD GAS, ARTERIAL
Acid-Base Excess: 6.3 mmol/L — ABNORMAL HIGH (ref 0.0–2.0)
Bicarbonate: 29.1 mmol/L — ABNORMAL HIGH (ref 20.0–28.0)
FIO2: 1
O2 Saturation: 99.7 %
PATIENT TEMPERATURE: 37
PH ART: 7.54 — AB (ref 7.350–7.450)
PO2 ART: 180 mmHg — AB (ref 83.0–108.0)
pCO2 arterial: 34 mmHg (ref 32.0–48.0)

## 2016-09-07 LAB — BASIC METABOLIC PANEL
ANION GAP: 3 — AB (ref 5–15)
BUN: 15 mg/dL (ref 6–20)
CALCIUM: 7 mg/dL — AB (ref 8.9–10.3)
CO2: 28 mmol/L (ref 22–32)
CREATININE: 0.37 mg/dL — AB (ref 0.44–1.00)
Chloride: 102 mmol/L (ref 101–111)
GFR calc non Af Amer: >60 mL/min (ref >60)
Glucose, Bld: 136 mg/dL — ABNORMAL HIGH (ref 65–99)
Potassium: 4.1 mmol/L (ref 3.5–5.1)
SODIUM: 133 mmol/L — AB (ref 135–145)

## 2016-09-07 LAB — CBC
HCT: 25.3 % — ABNORMAL LOW (ref 35.0–47.0)
Hemoglobin: 8.5 g/dL — ABNORMAL LOW (ref 12.0–16.0)
MCH: 27.8 pg (ref 26.0–34.0)
MCHC: 33.5 g/dL (ref 32.0–36.0)
MCV: 83 fL (ref 80.0–100.0)
PLATELETS: 447 10*3/uL — AB (ref 150–440)
RBC: 3.05 MIL/uL — ABNORMAL LOW (ref 3.80–5.20)
RDW: 14.4 % (ref 11.5–14.5)
WBC: 20.1 10*3/uL — ABNORMAL HIGH (ref 3.6–11.0)

## 2016-09-07 LAB — BODY FLUID CELL COUNT WITH DIFFERENTIAL
EOS FL: 0 %
LYMPHS FL: 33 %
MONOCYTE-MACROPHAGE-SEROUS FLUID: 2 %
NEUTROPHIL FLUID: 61 %
OTHER CELLS FL: 4 %
Total Nucleated Cell Count, Fluid: 798 cu mm

## 2016-09-07 LAB — PROTIME-INR
INR: 1.2
Prothrombin Time: 15.3 seconds — ABNORMAL HIGH (ref 11.4–15.2)

## 2016-09-07 LAB — GLUCOSE, CAPILLARY
GLUCOSE-CAPILLARY: 173 mg/dL — AB (ref 65–99)
GLUCOSE-CAPILLARY: 118 mg/dL — AB (ref 65–99)
GLUCOSE-CAPILLARY: 164 mg/dL — AB (ref 65–99)
GLUCOSE-CAPILLARY: 152 mg/dL — AB (ref 65–99)
Glucose-Capillary: 132 mg/dL — ABNORMAL HIGH (ref 65–99)

## 2016-09-07 LAB — GRAM STAIN

## 2016-09-07 LAB — PROCALCITONIN: Procalcitonin: 1.39 ng/mL

## 2016-09-07 LAB — PROTEIN, BODY FLUID: Total protein, fluid: <3 g/dL

## 2016-09-07 LAB — CREATININE, FLUID (PLEURAL, PERITONEAL, JP DRAINAGE): CREAT FL: 0.4 mg/dL

## 2016-09-07 LAB — PHOSPHORUS: PHOSPHORUS: 2.6 mg/dL (ref 2.5–4.6)

## 2016-09-07 LAB — MAGNESIUM: MAGNESIUM: 1.9 mg/dL (ref 1.7–2.4)

## 2016-09-07 LAB — PREPARE RBC (CROSSMATCH)

## 2016-09-07 MED ORDER — IPRATROPIUM-ALBUTEROL 0.5-2.5 (3) MG/3ML IN SOLN
3.0000 mL | RESPIRATORY_TRACT | Status: DC
  Administered 2016-09-07 – 2016-09-08 (×3): 3 mL via RESPIRATORY_TRACT
  Filled 2016-09-07 (×3): qty 3

## 2016-09-07 MED ORDER — FUROSEMIDE 10 MG/ML IJ SOLN
40.0000 mg | Freq: Once | INTRAMUSCULAR | Status: AC
  Administered 2016-09-07: 40 mg via INTRAVENOUS
  Filled 2016-09-07: qty 4

## 2016-09-07 MED ORDER — VANCOMYCIN HCL IN DEXTROSE 1-5 GM/200ML-% IV SOLN
1000.0000 mg | Freq: Once | INTRAVENOUS | Status: AC
  Administered 2016-09-07: 1000 mg via INTRAVENOUS
  Filled 2016-09-07: qty 200

## 2016-09-07 MED ORDER — STERILE WATER FOR INJECTION IJ SOLN
INTRAMUSCULAR | Status: AC
  Administered 2016-09-07: 10 mL
  Filled 2016-09-07: qty 10

## 2016-09-07 MED ORDER — VANCOMYCIN HCL IN DEXTROSE 750-5 MG/150ML-% IV SOLN
750.0000 mg | Freq: Two times a day (BID) | INTRAVENOUS | Status: DC
  Administered 2016-09-08: 750 mg via INTRAVENOUS
  Filled 2016-09-07 (×2): qty 150

## 2016-09-07 MED ORDER — TRACE MINERALS CR-CU-MN-SE-ZN 10-1000-500-60 MCG/ML IV SOLN
INTRAVENOUS | Status: AC
  Administered 2016-09-07: 18:00:00 via INTRAVENOUS
  Filled 2016-09-07: qty 1992

## 2016-09-07 MED ORDER — SODIUM CHLORIDE 0.9 % IV SOLN: Freq: Once | INTRAVENOUS | Status: DC

## 2016-09-07 MED ORDER — ALBUTEROL SULFATE (2.5 MG/3ML) 0.083% IN NEBU
2.5000 mg | INHALATION_SOLUTION | RESPIRATORY_TRACT | Status: DC | PRN
  Administered 2016-09-07: 2.5 mg via RESPIRATORY_TRACT
  Filled 2016-09-07: qty 3

## 2016-09-07 MED ORDER — FAT EMULSION 20 % IV EMUL
250.0000 mL | INTRAVENOUS | Status: AC
  Administered 2016-09-07: 250 mL via INTRAVENOUS
  Filled 2016-09-07: qty 250

## 2016-09-07 NOTE — Progress Notes (Signed)
RN called [facility] and spoke with Dr. Mortimer Fries and made MD aware that since thoracentesis and lasix given that patient's blood pressure has been soft with MAP ranging from 53-62.  Patient very sleepy but arouses to shaking her shoulder and speech combined.  Only 200 cc output from IV lasix and 700 cc removed during thoracentesis.  MD stated "just monitor her for now."  No orders given at this time.

## 2016-09-07 NOTE — Progress Notes (Signed)
ANTIBIOTIC CONSULT NOTE - INITIAL  Pharmacy Consult for Vancomycin  Indication: pneumonia  No Known Allergies  Patient Measurements: Height: 5\' 4"  (162.6 cm) Weight: 184 lb 12.8 oz (83.8 kg) IBW/kg (Calculated) : 54.7 Adjusted Body Weight: 66.34 kg   Vital Signs: Temp: 98.6 F (37 C) (12/25 1447) Temp Source: Axillary (12/25 1447) BP: 103/42 (12/25 2100) Pulse Rate: 79 (12/25 2100) Intake/Output from previous day: 12/24 0701 - 12/25 0700 In: 1576 [I.V.:1476; IV Piggyback:100] Out: 1770 [Urine:1600; Drains:170] Intake/Output from this shift: Total I/O In: -  Out: 650 [Urine:650]  Labs:  Recent Labs  09/05/16 0618  09/06/16 0532 09/07/16 0648 09/07/16 1346  WBC 18.6*  --  17.7* 12.3* 20.1*  HGB 7.7*  < > 8.3* 5.1* 8.5*  PLT 353  --  342 250 447*  CREATININE 0.35*  --  0.42* 0.37*  --   < > = values in this interval not displayed. Estimated Creatinine Clearance: 53.8 mL/min (by C-G formula based on SCr of 0.37 mg/dL (L)). No results for input(s): VANCOTROUGH, VANCOPEAK, VANCORANDOM, GENTTROUGH, GENTPEAK, GENTRANDOM, TOBRATROUGH, TOBRAPEAK, TOBRARND, AMIKACINPEAK, AMIKACINTROU, AMIKACIN in the last 72 hours.   Microbiology: Recent Results (from the past 720 hour(s))  Surgical pcr screen     Status: None   Collection Time: 08/10/16  1:34 PM  Result Value Ref Range Status   MRSA, PCR NEGATIVE NEGATIVE Final   Staphylococcus aureus NEGATIVE NEGATIVE Final    Comment:        The Xpert SA Assay (FDA approved for NASAL specimens in patients over 71 years of age), is one component of a comprehensive surveillance program.  Test performance has been validated by [facility] for patients greater than or equal to 65 year old. It is not intended to diagnose infection nor to guide or monitor treatment.   Blood culture (routine x 2)     Status: None   Collection Time: 08/30/16  7:25 PM  Result Value Ref Range Status   Specimen Description BLOOD RIGHT ANTECUBITAL   Final   Special Requests BOTTLES DRAWN AEROBIC AND ANAEROBIC 5CC EA  Final   Culture NO GROWTH 5 DAYS  Final   Report Status 09/05/2016 FINAL  Final  Blood culture (routine x 2)     Status: None   Collection Time: 08/26/2016  2:02 AM  Result Value Ref Range Status   Specimen Description BLOOD RIGHT FOREARM  Final   Special Requests IN PEDIATRIC BOTTLE 1.5CC  Final   Culture NO GROWTH 5 DAYS  Final   Report Status 09/05/2016 FINAL  Final  MRSA PCR Screening     Status: None   Collection Time: 09/11/2016  4:35 AM  Result Value Ref Range Status   MRSA by PCR NEGATIVE NEGATIVE Final    Comment:        The GeneXpert MRSA Assay (FDA approved for NASAL specimens only), is one component of a comprehensive MRSA colonization surveillance program. It is not intended to diagnose MRSA infection nor to guide or monitor treatment for MRSA infections.   Body fluid culture     Status: None   Collection Time: 08/21/2016  2:55 PM  Result Value Ref Range Status   Specimen Description PELVIS  Final   Special Requests NONE  Final   Gram Stain   Final    ABUNDANT WBC PRESENT, PREDOMINANTLY PMN NO ORGANISMS SEEN    Culture   Final    NO GROWTH 3 DAYS Performed at [facility]    Report Status  09/04/2016 FINAL  Final  Gram stain     Status: None   Collection Time: 09/07/16  3:50 PM  Result Value Ref Range Status   Specimen Description FLUID PLEURAL  Final   Special Requests NONE  Final   Gram Stain   Final    MODERATE WBC PRESENT,BOTH PMN AND MONONUCLEAR NO ORGANISMS SEEN    Report Status 09/07/2016 FINAL  Final    Medical History: Past Medical History:  Diagnosis Date  . Cancer [facility])    colon polyps were cancerous  . Complication of anesthesia    BP dropped, Dr. Staci Acosta had to stop the procedure.  . Hypotension   . Medical history non-contributory     Medications:  Prescriptions Prior to Admission  Medication Sig Dispense Refill Last Dose  . acetaminophen (TYLENOL) 500 MG  tablet Take 500-1,000 mg by mouth every 6 (six) hours as needed (for pain).   08/30/2016 at 1630  . Calcium Carbonate-Vitamin Medina (CALTRATE 600+Medina PO) Take 1 tablet by mouth daily.   Past Month at Unknown time  . HYDROcodone-acetaminophen (NORCO/VICODIN) 5-325 MG tablet Take 1-2 tablets by mouth every 6 (six) hours as needed for moderate pain. 30 tablet 0 08/30/2016 at 1715  . erythromycin base (E-MYCIN) 500 MG tablet      . KLOR-CON M20 20 MEQ tablet      . neomycin (MYCIFRADIN) 500 MG tablet       Assessment: CrCl = 53.8 ml/min Ke = 0.049 hr-1 T1/2 = 14 hrs Vd = 46.4 L   Goal of Therapy:  Vancomycin trough level 15-20 mcg/ml  Plan:  Expected duration 7 days with resolution of temperature and/or normalization of WBC   Vancomycin 1 gm IV X 1 ordered to be given on 12/25 @ 23:00. Vancomycin 750 mg IV Q12H ordered to start on 12/26 @ 0600, 7 hrs after 1st dose (stacked dosing). This pt will reach Css by 12/28 @ 21:00. Will draw 1st trough on 12/23 @ 17:30, which will be very close to Css.   Katherine Medina 09/07/2016,9:24 PM

## 2016-09-07 NOTE — Progress Notes (Signed)
[facility] Progress Note Patient Name: Katherine Medina DOB: September 04, 1929 MRN: [id number]   Date of Service  09/07/2016  HPI/Events of Note  Called by bedside RN, patient s/p thoracentesis Low BP MAP 62 after lasix and procedure Camera check -patient seems to be resting OK  eICU Interventions  Watch BP, will consider IVF bolus if low BP persists     Intervention Category Evaluation Type: Other  Flora Lipps 09/07/2016, 5:21 PM

## 2016-09-07 NOTE — Consult Note (Signed)
[place] [facility] [place] NAME: Katherine Medina    MR#:  [id number]  DATE OF BIRTH:  Sep 06, 1929  DATE OF ADMISSION:  09/11/2016  PRIMARY CARE PHYSICIAN: Madelyn Brunner, MD   REQUESTING/REFERRING PHYSICIAN: cooper  CHIEF COMPLAINT:   Chief Complaint  Patient presents with  . Post-op Problem    Transfer from [facility]    HISTORY OF PRESENT ILLNESS: Katherine Medina  is a 82 y.o. female with a known history of colon cancer- had hysterectomy, hemicolectomy done 3 weeks ago- due to finding of cancerous polyp ( total 2) on colonoscopy.She was sent to rehabilitation for and again had sepsis so taken to [facility], found to have intra-abdominal fluid collection and sent to [facility] as her initial surgery was here. On drainage of the fluid it was noted to be urine from her abdomen, and so finally she was diagnosed to have injury to the ureter during the initial surgery and urologist did surgery to help with that, in this admission. Patient is on TPN and also had NG tube for years for suction and decompression up until 2 days ago. She also have large hiatal hernia on CT scan. She is on antibiotic to cover for intra-abdominal infection. She is maintained on subcutaneous heparin since admission but for last 2 days she is noted to have blood in the urine so heparin is on hold for now. Today she was noted to be tachypneic and severe the hypoxic with tachycardia with, chest x-ray showed opacity in left hemithorax so stat internal medicine consult was called in. On further questioning patient complains of cough since last 2-3 weeks but she denies any chest pain. She denies any excessive bleeding other than minor amount of blood in her urine.  PAST MEDICAL HISTORY:   Past Medical History:  Diagnosis Date  . Cancer ([place])   . Complication of anesthesia    BP dropped, Dr. Staci Acosta had to stop the procedure.  . Hypotension   . Medical history non-contributory     PAST  SURGICAL HISTORY: Past Surgical History:  Procedure Laterality Date  . [place]  . COLON RESECTION N/A 08/20/2016   Procedure: HAND ASSISTED LAPAROSCOPIC COLON RESECTION/ HEMI COLECTOMY;  Surgeon: Jules Husbands, MD;  Location: [facility] ORS;  Service: General;  Laterality: N/A;  . COLONOSCOPY    . COLONOSCOPY WITH PROPOFOL N/A 06/30/2016   Procedure: COLONOSCOPY WITH PROPOFOL;  Surgeon: Lollie Sails, MD;  Location: [facility] ENDOSCOPY;  Service: Endoscopy;  Laterality: N/A;  . CYSTOSCOPY WITH RETROGRADE PYELOGRAM, URETEROSCOPY AND STENT PLACEMENT Bilateral 09/08/2016   Procedure: CYSTOSCOPY WITH bilateral RETROGRADE PYELOGRAM, diagnostic URETEROSCOPY, exploratory laparotomy with reimplantation/repair of left ureter;  Surgeon: Ardis Hughs, MD;  Location: [facility] ORS;  Service: Urology;  Laterality: Bilateral;  . EYE SURGERY     cataract with IOL Bilateral  . HYSTEROSCOPY W/D&C N/A 07/10/2016   Procedure: DILATATION AND CURETTAGE /HYSTEROSCOPY WITH ENDOMETRIAL BIOPSY;  Surgeon: Honor Loh Ward, MD;  Location: [facility] ORS;  Service: Gynecology;  Laterality: N/A;  . LAPAROSCOPIC APPENDECTOMY N/A 04/01/2016   Procedure: APPENDECTOMY LAPAROSCOPIC;  Surgeon: Jules Husbands, MD;  Location: [facility] ORS;  Service: General;  Laterality: N/A;  . LAPAROSCOPIC HYSTERECTOMY N/A 08/20/2016   Procedure: HYSTERECTOMY TOTAL LAPAROSCOPIC WITH BSO;  Surgeon: Honor Loh Ward, MD;  Location: [facility] ORS;  Service: Gynecology;  Laterality: N/A;  . LAPAROTOMY N/A 08/18/2016   Procedure: EXPLORATORY LAPAROTOMY;  Surgeon: Ardis Hughs, MD;  Location: [facility] ORS;  Service: Urology;  Laterality: N/A;  . PARTIAL COLECTOMY N/A 08/20/2016   Procedure: PARTIAL COLECTOMY;  Surgeon: Jules Husbands, MD;  Location: [facility] ORS;  Service: General;  Laterality: N/A;    SOCIAL HISTORY:  Social History  Substance Use Topics  . Smoking status: Never Smoker  . Smokeless tobacco: Never Used  . Alcohol use No    FAMILY  HISTORY:  Family History  Problem Relation Age of Onset  . Diabetes Mother   . Hypertension Father   . Colon cancer Neg Hx     DRUG ALLERGIES: No Known Allergies  REVIEW OF SYSTEMS:   CONSTITUTIONAL: No fever, fatigue or weakness.  EYES: No blurred or double vision.  EARS, NOSE, AND THROAT: No tinnitus or ear pain.  RESPIRATORY: Positive for cough, shortness of breath, no wheezing or hemoptysis.  CARDIOVASCULAR: No chest pain, orthopnea, edema.  GASTROINTESTINAL: No nausea, vomiting, diarrhea , she has some abdominal pain.  GENITOURINARY: No dysuria, hematuria.  ENDOCRINE: No polyuria, nocturia,  HEMATOLOGY: No anemia, easy bruising or bleeding SKIN: No rash or lesion. MUSCULOSKELETAL: No joint pain or arthritis.   NEUROLOGIC: No tingling, numbness, weakness.  PSYCHIATRY: No anxiety or depression.   MEDICATIONS AT HOME:  Prior to Admission medications   Medication Sig Start Date End Date Taking? Authorizing Provider  acetaminophen (TYLENOL) 500 MG tablet Take 500-1,000 mg by mouth every 6 (six) hours as needed (for pain).   Yes Historical Provider, MD  Calcium Carbonate-Vitamin D (CALTRATE 600+D PO) Take 1 tablet by mouth daily.   Yes Historical Provider, MD  HYDROcodone-acetaminophen (NORCO/VICODIN) 5-325 MG tablet Take 1-2 tablets by mouth every 6 (six) hours as needed for moderate pain. 08/28/16  Yes Diego F Pabon, MD  erythromycin base (E-MYCIN) 500 MG tablet  07/24/16   Historical Provider, MD  KLOR-CON M20 20 MEQ tablet  08/11/16   Historical Provider, MD  neomycin (MYCIFRADIN) 500 MG tablet  07/24/16   Historical Provider, MD      PHYSICAL EXAMINATION:   VITAL SIGNS: Blood pressure (!) 132/58, pulse 87, temperature 98.1 F (36.7 C), temperature source Oral, resp. rate (!) 28, height 5\' 4"  (1.626 m), weight 83.8 kg (184 lb 12.8 oz), SpO2 94 %.  GENERAL:  82 y.o.-year-old patient lying in the bed with  acute distress, critical appearing.  EYES: Pupils equal, round,  reactive to light and accommodation. No scleral icterus. Extraocular muscles intact.  HEENT: Head atraumatic, normocephalic. Oropharynx and nasopharynx clear.  NECK:  Supple, no jugular venous distention. No thyroid enlargement, no tenderness.  LUNGS: No breath sound on left side, no wheezing, rales,rhonchi or crepitation. Positive use of accessory muscles of respiration. On non-rebreather mask. CARDIOVASCULAR: S1, S2 normal. No murmurs, rubs, or gallops.  ABDOMEN: Soft, nontender, nondistended. Bowel sounds present. No organomegaly or mass. Surgical dressing and drainage tube present. Foley catheter has some light red colored urine in the bag. EXTREMITIES: No pedal edema, cyanosis, or clubbing.  NEUROLOGIC: Cranial nerves II through XII are intact. Muscle strength 4/5 in all extremities. Sensation intact. Gait not checked.  PSYCHIATRIC: The patient is alert and oriented x 3.  SKIN: No obvious rash, lesion, or ulcer.   LABORATORY PANEL:   CBC  Recent Labs Lab 09/02/16 2114 09/03/16 0219 09/05/16 0618 09/06/16 0025 09/06/16 0532 09/07/16 0648  WBC 32.4* 28.1* 18.6*  --  17.7* 12.3*  HGB 9.2* 8.5* 7.7* 8.2* 8.3* 5.1*  HCT 29.1* 26.1* 22.5* 24.5* 24.8* 15.8*  PLT 369 339 353  --  342 250  MCV 83.4 83.9 82.0  --  82.3 85.3  MCH 26.3 27.2 28.0  --  27.4 27.7  MCHC 31.5* 32.4 34.2  --  33.3 32.4  RDW 15.0* 15.3* 14.4  --  14.1 14.3   ------------------------------------------------------------------------------------------------------------------  Chemistries   Recent Labs Lab 09/02/16 0606  09/03/16 0219 09/03/16 0804 09/04/16 0518 09/05/16 0618 09/06/16 0532 09/06/16 1959 09/07/16 0648  NA 136  < > 135  --  137 135 136  --  133*  K 4.8  < > 4.6  --  3.6 3.8 3.4* 3.4* 4.1  CL 110  < > 108  --  106 103 104  --  102  CO2 22  < > 23  --  27 28 28   --  28  GLUCOSE 108*  < > 150*  --  140* 145* 152*  --  136*  BUN 20  < > 14  --  9 8 10   --  15  CREATININE 0.90  < > 0.63  --   0.56 0.35* 0.42*  --  0.37*  CALCIUM 6.8*  < > 7.3*  --  7.3* 6.9* 6.9*  --  7.0*  MG 1.7  --   --  1.6* 1.6* 1.7 1.7  --  1.9  AST 24  --   --   --  22  --   --   --   --   ALT 11*  --   --   --  9*  --   --   --   --   ALKPHOS 55  --   --   --  71  --   --   --   --   BILITOT 0.6  --   --   --  0.7  --   --   --   --   < > = values in this interval not displayed. ------------------------------------------------------------------------------------------------------------------ estimated creatinine clearance is 53.8 mL/min (by C-G formula based on SCr of 0.37 mg/dL (L)). ------------------------------------------------------------------------------------------------------------------ No results for input(s): TSH, T4TOTAL, T3FREE, THYROIDAB in the last 72 hours.  Invalid input(s): FREET3   Coagulation profile  Recent Labs Lab 09/02/16 0606  INR 1.62   ------------------------------------------------------------------------------------------------------------------- No results for input(s): DDIMER in the last 72 hours. -------------------------------------------------------------------------------------------------------------------  Cardiac Enzymes  Recent Labs Lab 09/02/16 2046 09/03/16 0219 09/03/16 0804  TROPONINI 0.05* 0.05* <0.03   ------------------------------------------------------------------------------------------------------------------ Invalid input(s): POCBNP  ---------------------------------------------------------------------------------------------------------------  Urinalysis    Component Value Date/Time   COLORURINE YELLOW (A) 03/31/2016 2120   APPEARANCEUR HAZY (A) 03/31/2016 2120   APPEARANCEUR Turbid 06/02/2012 0837   LABSPEC 1.060 (H) 03/31/2016 2120   LABSPEC 1.018 06/02/2012 0837   PHURINE 5.0 03/31/2016 2120   GLUCOSEU NEGATIVE 03/31/2016 2120   GLUCOSEU Negative 06/02/2012 0837   HGBUR NEGATIVE 03/31/2016 2120   BILIRUBINUR NEGATIVE  03/31/2016 2120   BILIRUBINUR Negative 06/02/2012 0837   KETONESUR 1+ (A) 03/31/2016 2120   PROTEINUR NEGATIVE 03/31/2016 2120   NITRITE NEGATIVE 03/31/2016 2120   LEUKOCYTESUR 3+ (A) 03/31/2016 2120   LEUKOCYTESUR 3+ 06/02/2012 0837     RADIOLOGY: Dg Chest 1 View  Result Date: 09/07/2016 CLINICAL DATA:  Recent abdominal surgery, shortness of breath, hypoxia, CHF EXAM: CHEST 1 VIEW COMPARISON:  09/02/2016 FINDINGS: Limited portable semi upright exam. Large hiatal hernia projects over the cardiac silhouette with an air-fluid level. Moderate to large pleural effusions present with bibasilar collapse/ consolidation. Dense consolidation throughout the left lung. The cardiac silhouette is obscured. Aorta  is atherosclerotic. Right PICC line tip at the lower SVC level. IMPRESSION: Enlarging pleural effusions with bilateral worsening collapse/consolidation. CHF is favored however difficult to exclude developing pneumonia. Large hiatal hernia Aortic atherosclerosis Electronically Signed   By: Jerilynn Mages.  Shick M.D.   On: 09/07/2016 13:22    EKG: Orders placed or performed during the hospital encounter of 09/03/2016  . EKG 12-Lead  . EKG 12-Lead  . EKG 12-Lead  . EKG 12-Lead    IMPRESSION AND PLAN:  * Acute hypoxic respiratory failure   Left-sided large pleural effusion    Possibilities are hemothorax versus urine in the pleural space versus infected pleural effusion.   Continue supplemental oxygen, transferred to stepdown unit, urgent pulmonary consultation.   I spoke to the radiologist to have an urgent thoracentesis today.   Discussed with patient's husband and daughter about the critical condition and possibility of further respiratory status worsening and requirement of intubation or ventilator, they understand and would like to have her to be full code in any adverse event.  * Acute on chronic anemia   Currently the reason is unknown.   Sudden drop in hemoglobin since yesterday.   No major  bleeding.   Heparin subcutaneous is on hold since yesterday because of hematuria.   Check repeat CBC, check INR.   2 units of blood transfusion.    discussed with pt about possible side effects of blood transfusion- including more common like low grade fever to rare and serious like renal and lung involvement and infections.  She and her family understood- and due to necessity of transfusion- agreed to receive the transfusion.  *  surgery for hemicolectomy with injuries to urinary tract and urologic surgery.   Continue management as per primary team.   She is on Zosyn.  * Severe malnutrition   Albumin is 1.3, patient is on TPN.   Continue monitoring.  All the records are reviewed and case discussed with ED provider. Management plans discussed with the patient, family and they are in agreement.  CODE STATUS: Full code    Code Status Orders        Start     Ordered   09/11/2016 0323  Full code  Continuous     08/24/2016 0327    Code Status History    Date Active Date Inactive Code Status Order ID Comments User Context   08/20/2016  3:48 PM 08/28/2016  9:28 PM Full Code [id number]  Jules Husbands, MD Inpatient   04/01/2016  1:50 AM 04/03/2016  5:17 PM Full Code [id number]  Florene Glen, MD ED     Condition is critical with presence of acute respiratory failure and large left pleural effusion. Also has many complicating factors like malnutrition, anemia- which make her recovery sluggish.   Patient's relatives understand this and they would like her to be full code in any adverse event, but if at some point it seems she will not be covered in husband would decide about her being DO NOT RESUSCITATE.  I discussed with patient's husband, pulmonology on call and radiology on call.  TOTAL TIME TAKING CARE OF THIS PATIENT: 60 critical care minutes.    Vaughan Basta M.D on 09/07/2016   Between 7am to 6pm - Pager - [phone]  After 6pm go to [email or web address] - password [email or web address]  [facility]  Office  [phone]  CC: Primary care physician; Madelyn Brunner, MD   Note: This dictation was prepared with Dragon dictation along with  smaller phrase technology. Any transcriptional errors that result from this process are unintentional.

## 2016-09-07 NOTE — Progress Notes (Signed)
Patient transferred to intensive care unit. She is undergoing respiratory distress. Her chest x-ray appears grossly abnormal.  Her care was discussed with the critical care medicine physician as well as the ICU nurse. And I was present during the discussion by critical care with the family. I'm in agreement with a chest CT scan at this point

## 2016-09-07 NOTE — Consult Note (Signed)
Name: Katherine Medina MRN: [id number] DOB: Jul 31, 1929    ADMISSION DATE:  09/07/2016 CONSULTATION DATE:  09/07/2016  REFERRING MD :  Dr. Burt Knack  CHIEF COMPLAINT:  Abdominal Pain  BRIEF PATIENT DESCRIPTION: This is an 82 yo female who presented from [facility] due to CT Abd Pelvis results revealing a pelvic abscess.  Surgeon's at [facility] felt the pt should be seen by her surgeon Dr. Dahlia Byes here at [facility] who performed a right colon resection for colon cancer and concomitant hysterectomy 08/20/16  SIGNIFICANT EVENTS  12/18-Pt transferred from [facility] with c/o abdominal pain CT abd pelvis at [facility] demonstrated a pelvic abscess, therefore admitted to [place] [facility]. 12/18-Open left ureteral reimplant with bilat retrograde, and left JJ stent for repair of delayed left ureteral injury after hemicolectomy 12/25-Pt transferred to [place] [facility] due to acute hypoxic respiratory failure requiring prn Bipap 12/25-Left thoracentesis performed by IR-removed 700 ml of yellow pleural fluid  STUDIES:  CT Abd Pelvis 12/17>>Large hiatal hernia containing the majority of the stomach. Postsurgical changes of right hemicolectomy with ileotransverse anastomosis. Mildly distended thickened loops of small bowel most compatible with an ileus. Enteritis is not excluded. Clinical correlation is recommended. No evidence of bowel obstruction. Sigmoid diverticulosis. Small ascites, diffuse mesenteric edema and anasarca. Loculated appearing fluid within the pelvis with enhancing wall concerning for an infected collection or developing abscess. Clinical correlation is recommended. CT Angio Chest 12/17>>Interval development of small bilateral pleural effusions with associated partial consolidative changes of the lung bases which may represent atelectasis versus infiltrate. Clinical correlation and follow-up to resolution recommended. No CT evidence of aortic dissection or pulmonary  embolism.  HISTORY OF PRESENT ILLNESS:   This is an 82 yo female with a PMH of Hypotension, Laparoscopic Appendectomy, Cancerous colon polyps, Lynch syndrome, Right colon Resection, and Concomitant hysterectomy.  She initially presented to [facility] ER 12/17 with acute onset of abdominal pain, nausea, and vomiting.  It was also noted she developed hypotension that responded to iv fluid resuscitation at that time.  A CT Abd Pelvis was performed at [facility] 12/17 that demonstrated a large pelvic fluid collection.  Therefore, due to CT Abd Pelvis findings [facility] physician transferred pt to [facility] for further evaluation and care by Dr. Dahlia Byes who performed her initial surgery. She had a laparoscopic hand assisted right hemicolectomy and hysterectomy performed by Dr. Dahlia Byes on 12/7 due to newly diagnosed adenocarcinoma. Upon transfer to [facility] on 12/18 a ureteral repair was performed by urology due to delayed left ureteral injury after hemicolectomy.  It was also noted the pt developed a postop ileus following initial surgery requiring NG tube placement for decompression up until 12/23.  PCCM consulted 12/25 due to acute hypoxic respiratory failure secondary to bilateral pleural effusions and atelectasis.    PAST MEDICAL HISTORY :   has a past medical history of Cancer ([place]); Complication of anesthesia; Hypotension; and Medical history non-contributory.  has a past surgical history that includes laparoscopic appendectomy (N/A, 04/01/2016); Colonoscopy; Colonoscopy with propofol (N/A, 06/30/2016); Cesarean section (1962 and 1969); Hysteroscopy w/D&C (N/A, 07/10/2016); Eye surgery; Colon resection (N/A, 08/20/2016); Partial colectomy (N/A, 08/20/2016); Laparoscopic hysterectomy (N/A, 08/20/2016); Cystoscopy with retrograde pyelogram, ureteroscopy and stent placement (Bilateral, 08/21/2016); and laparotomy (N/A, 08/19/2016). Prior to Admission medications   Medication Sig Start Date End Date Taking? Authorizing  Provider  acetaminophen (TYLENOL) 500 MG tablet Take 500-1,000 mg by mouth every 6 (six) hours as needed (for pain).   Yes Historical Provider,  MD  Calcium Carbonate-Vitamin D (CALTRATE 600+D PO) Take 1 tablet by mouth daily.   Yes Historical Provider, MD  HYDROcodone-acetaminophen (NORCO/VICODIN) 5-325 MG tablet Take 1-2 tablets by mouth every 6 (six) hours as needed for moderate pain. 08/28/16  Yes Diego F Pabon, MD  erythromycin base (E-MYCIN) 500 MG tablet  07/24/16   Historical Provider, MD  KLOR-CON M20 20 MEQ tablet  08/11/16   Historical Provider, MD  neomycin (MYCIFRADIN) 500 MG tablet  07/24/16   Historical Provider, MD   No Known Allergies  FAMILY HISTORY:  family history includes Diabetes in her mother; Hypertension in her father. SOCIAL HISTORY:  reports that she has never smoked. She has never used smokeless tobacco. She reports that she does not drink alcohol or use drugs.  REVIEW OF SYSTEMS:   Unable to assess pt currently extremely weak and will not communicate at this time  SUBJECTIVE:  Pt drowsy and weak remains on 45% venturi mask.  VITAL SIGNS: Temp:  [97.7 F (36.5 C)-98.6 F (37 C)] 98.6 F (37 C) (12/25 1447) Pulse Rate:  [79-103] 80 (12/25 1854) Resp:  [18-29] 20 (12/25 1854) BP: (92-145)/(37-65) 126/59 (12/25 1845) SpO2:  [87 %-100 %] 97 % (12/25 1854) FiO2 (%):  [45 %-100 %] 45 % (12/25 1854) Weight:  [184 lb 12.8 oz (83.8 kg)] 184 lb 12.8 oz (83.8 kg) (12/25 0500)  PHYSICAL EXAMINATION: General: acutely ill frail appearing Caucasian female Neuro:  Weak, following commands, moans and groans, PERRLA HEENT:  Supple, no JVD Cardiovascular: NSR, s1s2, no M/R/G Lungs:  Coarse, rhonchi throughout, mildly labored on venturi mask Abdomen:  Hypoactive BS x4, tender, soft, right abdominal jp drain intact draining serosanguinous fluid  Musculoskeletal:  Moves all extremities, 2+ generalized edema Skin:  Abdominal incisions dressings dry and intact   Recent  Labs Lab 09/05/16 0618 09/06/16 0532 09/06/16 1959 09/07/16 0648  NA 135 136  --  133*  K 3.8 3.4* 3.4* 4.1  CL 103 104  --  102  CO2 28 28  --  28  BUN 8 10  --  15  CREATININE 0.35* 0.42*  --  0.37*  GLUCOSE 145* 152*  --  136*    Recent Labs Lab 09/06/16 0532 09/07/16 0648 09/07/16 1346  HGB 8.3* 5.1* 8.5*  HCT 24.8* 15.8* 25.3*  WBC 17.7* 12.3* 20.1*  PLT 342 250 447*   Dg Chest 1 View  Result Date: 09/07/2016 CLINICAL DATA:  Status post thoracentesis EXAM: CHEST 1 VIEW COMPARISON:  09/07/2016 at 1234 hours FINDINGS: Small left pleural effusion, decreased status post thoracentesis. No pneumothorax is seen. Moderate layering right pleural effusion. Mild patchy right lower lobe opacity, likely atelectasis. Cardiomegaly. Right arm PICC terminates at the cavoatrial junction. IMPRESSION: Small left pleural effusion, decreased status post thoracentesis. No pneumothorax is seen. Moderate layering right pleural effusion. Electronically Signed   By: Julian Hy M.D.   On: 09/07/2016 16:26   Dg Chest 1 View  Result Date: 09/07/2016 CLINICAL DATA:  Recent abdominal surgery, shortness of breath, hypoxia, CHF EXAM: CHEST 1 VIEW COMPARISON:  09/02/2016 FINDINGS: Limited portable semi upright exam. Large hiatal hernia projects over the cardiac silhouette with an air-fluid level. Moderate to large pleural effusions present with bibasilar collapse/ consolidation. Dense consolidation throughout the left lung. The cardiac silhouette is obscured. Aorta is atherosclerotic. Right PICC line tip at the lower SVC level. IMPRESSION: Enlarging pleural effusions with bilateral worsening collapse/consolidation. CHF is favored however difficult to exclude developing pneumonia. Large hiatal hernia Aortic  atherosclerosis Electronically Signed   By: Jerilynn Mages.  Shick M.D.   On: 09/07/2016 13:22    ASSESSMENT / PLAN: Acute hypoxic respiratory failure secondary to bilateral pleural effusions and atelectasis s/p  hemicolectomy and hysterectomy with injuries to urinary tract P: Supplemental O2 to maintain O2 sats >92% Chest physiotherapy q2hrs Aggressive pulmonary hygiene S/p left thoracentesis with removal of 700 ml yellow pleural fluid 12/25 Aggressive bronchodilator therapy Follow cultures Will add Vancomycin, continue Zosyn Trend WBC's and monitor fever curve Trend PCT's  CXR in am 12/26 Prn ABG's  Marda Stalker, [place] Pager [phone] (please enter 7 digits) PCCM Consult Pager [phone] (please enter 7 digits)   PCCM ATTENDING ATTESTATION:  I have evaluated patient with the APP [place], reviewed database in its entirety and discussed care plan in detail. Agree with above plan as formulated by me.   Merton Border, MD PCCM service Mobile [phone] Pager [phone]

## 2016-09-07 NOTE — Progress Notes (Signed)
Report given to Amber, RN 

## 2016-09-07 NOTE — Progress Notes (Signed)
Noted  reddish oranged urine in the foley bag  and patient has heparin SQ this AM. Dr. Estanislado Pandy notified and he indicated the RN should hold the Heparin at this time. Will continue to monitor.

## 2016-09-07 NOTE — Progress Notes (Signed)
7 Days Post-Op  Subjective: Status post repair of disrupted ureter. Patient is slow to tolerate diet has very little appetite but is passing gas and having bowel movements and has minimal pain.  Objective: Vital signs in last 24 hours: Temp:  [97.7 F (36.5 C)-98.2 F (36.8 C)] 97.8 F (36.6 C) (12/25 0739) Pulse Rate:  [78-89] 82 (12/25 0739) Resp:  [18-20] 20 (12/25 0739) BP: (125-140)/(42-59) 125/49 (12/25 0739) SpO2:  [95 %-98 %] 95 % (12/25 0739) Weight:  [184 lb 12.8 oz (83.8 kg)] 184 lb 12.8 oz (83.8 kg) (12/25 0500) Last BM Date: 09/06/16  Intake/Output from previous day: 12/24 0701 - 12/25 0700 In: 1576 [I.V.:1476; IV Piggyback:100] Out: [id number] [Urine:1600; Drains:170] Intake/Output this shift: Total I/O In: -  Out: 15 [Drains:15]  Physical exam:  Vital signs are reviewed Rhonchus chest Abdomen is soft nontender wounds are clean Foley catheter is in place nontender calves  Lab Results: CBC   Recent Labs  09/05/16 0618 09/06/16 0025 09/06/16 0532  WBC 18.6*  --  17.7*  HGB 7.7* 8.2* 8.3*  HCT 22.5* 24.5* 24.8*  PLT 353  --  342   BMET  Recent Labs  09/06/16 0532 09/06/16 1959 09/07/16 0648  NA 136  --  133*  K 3.4* 3.4* 4.1  CL 104  --  102  CO2 28  --  28  GLUCOSE 152*  --  136*  BUN 10  --  15  CREATININE 0.42*  --  0.37*  CALCIUM 6.9*  --  7.0*   PT/INR No results for input(s): LABPROT, INR in the last 72 hours. ABG No results for input(s): PHART, HCO3 in the last 72 hours.  Invalid input(s): PCO2, PO2  Studies/Results: No results found.  Anti-infectives: Anti-infectives    Start     Dose/Rate Route Frequency Ordered Stop   08/23/2016 0900  piperacillin-tazobactam (ZOSYN) IVPB 3.375 g  Status:  Discontinued     3.375 g 12.5 mL/hr over 240 Minutes Intravenous Every 8 hours 09/03/2016 0337 08/16/2016 0434   09/02/2016 0900  piperacillin-tazobactam (ZOSYN) IVPB 3.375 g     3.375 g 12.5 mL/hr over 240 Minutes Intravenous Every 8 hours  09/05/2016 0434        Assessment/Plan: s/p Procedure(s): CYSTOSCOPY WITH bilateral RETROGRADE PYELOGRAM, diagnostic URETEROSCOPY, exploratory laparotomy with reimplantation/repair of left ureter EXPLORATORY LAPAROTOMY   Patient on TPN and slow to advance diet. Will try full liquids today but patient's appetite is poor and will require continued TPN and current therapy.  Florene Glen, MD, FACS  09/07/2016

## 2016-09-07 NOTE — Progress Notes (Signed)
Remains drowsy but arouses easily to voice.  Alert to self and place.  Denies pain except with repositioning.  NSR per cardiac monitor.  Blood pressure remains soft but improving and goes up when patient is awake.  Continue to monitor.  No respiratory distress noted.  Breathing has improved since thoracentesis.  o2 sats 97% on 45% venti mask at this time.  900 cc UOP since lasix given this evening.  Daughters and husband have visited since transfer.

## 2016-09-07 NOTE — Progress Notes (Signed)
I was called to see the patient for respiratory distress in which her heart rate and respiratory rate were both elevated. Again on exam she had bilateral rhonchi and appears somewhat uncomfortable.  I instituted a inhaler treatment which had not yet been given. A chest x-ray showed considerable is in her chest fields to suggest consolidation.  With all this in mind I asked prime doc to see the patient. I had initially asked prime doc and discussed with prime doc admission to the hospital at which time she was admitted to the ICU by prime doc is not seen the patient since that time frame when I had initially admitted her several days ago. Prime doc will be reconsult at this point.

## 2016-09-07 NOTE — Progress Notes (Signed)
Patient changed to 50% venturi mask due to sats of 100% on the NRB. Tol well at this time with sats of 95%. Will continue to monitor, RN aware of change.

## 2016-09-07 NOTE — Progress Notes (Signed)
Fat emulsion is running at a rate of 20 mL/hr. and is  supposed to run for 12 hours (un to 6 am) but the infusion is completed at this time. The bag was not scanned at 1800 and the RN couldn't figure out when it was started. Nat (the Pharmacist ) notified and he indicated the patient need  250 mL bag  X 1 dose so they're not going to send another bag. The infusion is stopped.   Patient is resting with daughter at her bedside. No respiratory distress noted. Will continue to monitor.

## 2016-09-07 NOTE — Progress Notes (Signed)
While [place] was attending to Pt in Rm 20, [place] met the husband and daughter of the Pt in ICU Rm 18. Pt's husband was anxious about his wife's health condition that had gotten worse. Pt's husband expressed fear of the worse outcome, he was visibly tearful, and was emotionally exhausted. [facility] encourage to Pt's husband and 2 daughters, the son-in-law, and offered silent prayers for Pt and a ministry of presence for the family.    09/07/16 1800  Clinical Encounter Type  Visited With Patient;Patient and family together  Visit Type Follow-up;Spiritual support  Referral From Family;Nurse  Consult/Referral To Chaplain  Spiritual Encounters  Spiritual Needs Prayer;Other (Comment)

## 2016-09-07 NOTE — Progress Notes (Signed)
[facility] CONSULT NOTE   Pharmacy Consult for TPN  Indication: Prolonged ileus    Patient Measurements: Height: 5\' 4"  (162.6 cm) Weight: 184 lb 12.8 oz (83.8 kg) IBW/kg (Calculated) : 54.7 TPN AdjBW (KG): 59.7 Body mass index is 31.72 kg/m. Usual Weight:   Assessment: Pharmacy consulted to assist in the management of electrolytes and glucose in this  82 year old woman receiving TPN for postop ileus.  K+= 4.1 Mag= 1.9  Phos=2.6 Ca= 7.0  Albumin=1.3   *Alb.Corrected Ca=9.16*   GI: NPO Endo:  Insulin requirements in the past 24 hours: 15 units  Lytes: Renal: Scr 0.37 Pulm: Cards:  Hepatobil: Neuro: ID:  Best Practices: TPN Access: TPN start date: 12/21  Nutritional Goals (per RD recommendation on 2223 calories ): kCal: Protein: 110 gm   Current Nutrition: NPO, TPN  Plan:   -Continue Clinimix * No Electrolytes * 5/15 at goal rate of  83 ml/hr with added MVI, trace and Thiamine 100mg  per bag. -20% lipid emulsion at 59ml/hr for 12 hours daily  -on SSI q4 hours.   12/25 Electrolytes WNL - no additional supplementation needed at this time. According to Dr. Copper, patient's deit will be advanced to full liquids today. Will need to F/U on TPN plan for tomorrow.   Pernell Dupre, PharmD, BCPS Clinical Pharmacist  09/07/2016,10:37 AM

## 2016-09-07 NOTE — Progress Notes (Signed)
Notified respiratory because patient's saturations would not go above 86 on nasal cannula.  Respiratory came to the bedside to assess patient. Patient placed on non-rebreather. Patient's oxygen saturation came up to 100%. Notified Dr. Carron Curie. Orders received for stat chest Xray and nebulizer treatment.

## 2016-09-07 NOTE — Progress Notes (Signed)
Patient transferred to ICU Bed 18. Report called and given to [facility].

## 2016-09-07 NOTE — Procedures (Signed)
US guided left thoracentesis.  Removed 700 ml of yellow pleural fluid.  No immediate complication.  CXR ordered.

## 2016-09-07 NOTE — Consult Note (Signed)
DC LR Lasix X 1 Thoracentesis - large volume HOB elevation IS q 1 hr PRN Follow CXR Consider Ct chest

## 2016-09-08 ENCOUNTER — Encounter: Payer: Self-pay | Admitting: *Deleted

## 2016-09-08 ENCOUNTER — Inpatient Hospital Stay: Payer: Medicare Other

## 2016-09-08 LAB — CBC WITH DIFFERENTIAL/PLATELET
BASOS PCT: 0 %
Basophils Absolute: 0 10*3/uL (ref 0–0.1)
EOS ABS: 0.4 10*3/uL (ref 0–0.7)
EOS PCT: 2 %
HEMATOCRIT: 23.7 % — AB (ref 35.0–47.0)
HEMOGLOBIN: 7.9 g/dL — AB (ref 12.0–16.0)
LYMPHS PCT: 4 %
Lymphs Abs: 0.8 10*3/uL — ABNORMAL LOW (ref 1.0–3.6)
MCH: 27.7 pg (ref 26.0–34.0)
MCHC: 33.3 g/dL (ref 32.0–36.0)
MCV: 83.3 fL (ref 80.0–100.0)
MONOS PCT: 11 %
Monocytes Absolute: 2.1 10*3/uL — ABNORMAL HIGH (ref 0.2–0.9)
NEUTROS ABS: 15.7 10*3/uL — AB (ref 1.4–6.5)
NEUTROS PCT: 83 %
PLATELETS: 430 10*3/uL (ref 150–440)
RBC: 2.84 MIL/uL — ABNORMAL LOW (ref 3.80–5.20)
RDW: 14.2 % (ref 11.5–14.5)
WBC: 19 10*3/uL — ABNORMAL HIGH (ref 3.6–11.0)

## 2016-09-08 LAB — COMPREHENSIVE METABOLIC PANEL
ALBUMIN: 1.3 g/dL — AB (ref 3.5–5.0)
ALT: 13 U/L — ABNORMAL LOW (ref 14–54)
ANION GAP: 4 — AB (ref 5–15)
AST: 27 U/L (ref 15–41)
Alkaline Phosphatase: 91 U/L (ref 38–126)
BUN: 17 mg/dL (ref 6–20)
CHLORIDE: 105 mmol/L (ref 101–111)
CO2: 29 mmol/L (ref 22–32)
Calcium: 7.2 mg/dL — ABNORMAL LOW (ref 8.9–10.3)
Creatinine, Ser: 0.55 mg/dL (ref 0.44–1.00)
GFR calc Af Amer: >60 mL/min (ref >60)
GFR calc non Af Amer: >60 mL/min (ref >60)
Glucose, Bld: 122 mg/dL — ABNORMAL HIGH (ref 65–99)
POTASSIUM: 3.3 mmol/L — AB (ref 3.5–5.1)
Sodium: 138 mmol/L (ref 135–145)
Total Bilirubin: 0.4 mg/dL (ref 0.3–1.2)
Total Protein: 4.8 g/dL — ABNORMAL LOW (ref 6.5–8.1)

## 2016-09-08 LAB — EXPECTORATED SPUTUM ASSESSMENT W REFEX TO RESP CULTURE

## 2016-09-08 LAB — PROCALCITONIN: PROCALCITONIN: 1.23 ng/mL

## 2016-09-08 LAB — GLUCOSE, CAPILLARY
GLUCOSE-CAPILLARY: 118 mg/dL — AB (ref 65–99)
GLUCOSE-CAPILLARY: 132 mg/dL — AB (ref 65–99)
GLUCOSE-CAPILLARY: 139 mg/dL — AB (ref 65–99)
GLUCOSE-CAPILLARY: 141 mg/dL — AB (ref 65–99)
GLUCOSE-CAPILLARY: 157 mg/dL — AB (ref 65–99)
Glucose-Capillary: 133 mg/dL — ABNORMAL HIGH (ref 65–99)
Glucose-Capillary: 151 mg/dL — ABNORMAL HIGH (ref 65–99)

## 2016-09-08 LAB — CBC
HCT: UNDETERMINED % (ref 35.0–47.0)
HEMOGLOBIN: UNDETERMINED g/dL (ref 12.0–16.0)
MCH: UNDETERMINED pg (ref 26.0–34.0)
MCHC: UNDETERMINED g/dL (ref 32.0–36.0)
MCV: UNDETERMINED fL (ref 80.0–100.0)
PLATELETS: UNDETERMINED 10*3/uL (ref 150–440)
RBC: UNDETERMINED MIL/uL (ref 3.80–5.20)
RDW: UNDETERMINED % (ref 11.5–14.5)
WBC: UNDETERMINED 10*3/uL (ref 3.6–11.0)

## 2016-09-08 LAB — MAGNESIUM: MAGNESIUM: 1.7 mg/dL (ref 1.7–2.4)

## 2016-09-08 LAB — EXPECTORATED SPUTUM ASSESSMENT W GRAM STAIN, RFLX TO RESP C

## 2016-09-08 LAB — PHOSPHORUS: PHOSPHORUS: 1.7 mg/dL — AB (ref 2.5–4.6)

## 2016-09-08 MED ORDER — POTASSIUM CHLORIDE 2 MEQ/ML IV SOLN
30.0000 meq | Freq: Once | INTRAVENOUS | Status: DC
  Filled 2016-09-08: qty 15

## 2016-09-08 MED ORDER — FAT EMULSION 20 % IV EMUL
250.0000 mL | INTRAVENOUS | Status: AC
  Administered 2016-09-08: 250 mL via INTRAVENOUS
  Filled 2016-09-08: qty 250

## 2016-09-08 MED ORDER — FUROSEMIDE 10 MG/ML IJ SOLN
40.0000 mg | Freq: Once | INTRAMUSCULAR | Status: AC
  Administered 2016-09-08: 40 mg via INTRAVENOUS
  Filled 2016-09-08: qty 4

## 2016-09-08 MED ORDER — IPRATROPIUM-ALBUTEROL 0.5-2.5 (3) MG/3ML IN SOLN
3.0000 mL | Freq: Four times a day (QID) | RESPIRATORY_TRACT | Status: DC
  Administered 2016-09-08 – 2016-09-12 (×17): 3 mL via RESPIRATORY_TRACT
  Filled 2016-09-08 (×16): qty 3

## 2016-09-08 MED ORDER — TRACE MINERALS CR-CU-MN-SE-ZN 10-1000-500-60 MCG/ML IV SOLN
INTRAVENOUS | Status: DC
  Filled 2016-09-08: qty 1680

## 2016-09-08 MED ORDER — THIAMINE HCL 100 MG/ML IJ SOLN
INTRAVENOUS | Status: AC
  Administered 2016-09-08: 18:00:00 via INTRAVENOUS
  Filled 2016-09-08: qty 1680

## 2016-09-08 MED ORDER — POTASSIUM PHOSPHATES 15 MMOLE/5ML IV SOLN
30.0000 mmol | Freq: Once | INTRAVENOUS | Status: AC
  Administered 2016-09-08: 30 mmol via INTRAVENOUS
  Filled 2016-09-08: qty 10

## 2016-09-08 MED ORDER — MAGNESIUM SULFATE 2 GM/50ML IV SOLN
2.0000 g | Freq: Once | INTRAVENOUS | Status: AC
  Administered 2016-09-08: 2 g via INTRAVENOUS
  Filled 2016-09-08: qty 50

## 2016-09-08 MED ORDER — KETOROLAC TROMETHAMINE 15 MG/ML IJ SOLN
30.0000 mg | Freq: Four times a day (QID) | INTRAMUSCULAR | Status: DC | PRN
  Administered 2016-09-08: 30 mg via INTRAVENOUS
  Filled 2016-09-08: qty 2

## 2016-09-08 MED ORDER — POTASSIUM CHLORIDE CRYS ER 20 MEQ PO TBCR
40.0000 meq | EXTENDED_RELEASE_TABLET | Freq: Two times a day (BID) | ORAL | Status: DC
  Administered 2016-09-08: 40 meq via ORAL
  Filled 2016-09-08: qty 2

## 2016-09-08 NOTE — Progress Notes (Signed)
8 Days Post-Op  Subjective: Status post repair of ureteral injury. She was transferred to the ICU yesterday for respiratory distress she feels better today but remains with a high respiratory rate but good saturations. She had a thoracentesis of 700 cc. Her husband is feeding her a full liquid diet at this point and she is tolerating. He remains on TPN.  Objective: Vital signs in last 24 hours: Temp:  [97.8 F (36.6 C)-98.6 F (37 C)] 98 F (36.7 C) (12/26 1230) Pulse Rate:  [76-118] 118 (12/26 1400) Resp:  [20-39] 39 (12/26 1400) BP: (92-126)/(37-64) 123/45 (12/26 1400) SpO2:  [90 %-100 %] 100 % (12/26 1400) FiO2 (%):  [45 %-100 %] 45 % (12/26 0344) Weight:  [180 lb 8.9 oz (81.9 kg)] 180 lb 8.9 oz (81.9 kg) (12/26 0434) Last BM Date: 09/07/16  Intake/Output from previous day: 12/25 0701 - 12/26 0700 In: 3144.5 [P.O.:90; I.V.:2544.5; IV Piggyback:500] Out: 4785 [Urine:4050; Drains:35] Intake/Output this shift: Total I/O In: 1304.7 [P.O.:120; I.V.:554.7; Other:20; IV Piggyback:610] Out: 2250 [Urine:2250]  Physical exam:  Alert vital signs reviewed remains tachypneic Abdomen is soft wounds are clean soft and nontender minimally distended  Lab Results: CBC   Recent Labs  09/07/16 1346 09/08/16 0311  WBC 20.1* 19.0*  HGB 8.5* 7.9*  HCT 25.3* 23.7*  PLT 447* 430   BMET  Recent Labs  09/07/16 0648 09/08/16 0311  NA 133* 138  K 4.1 3.3*  CL 102 105  CO2 28 29  GLUCOSE 136* 122*  BUN 15 17  CREATININE 0.37* 0.55  CALCIUM 7.0* 7.2*   PT/INR  Recent Labs  09/07/16 1401  LABPROT 15.3*  INR 1.20   ABG  Recent Labs  09/07/16 1918  PHART 7.54*  HCO3 29.1*    Studies/Results: Dg Chest 1 View  Result Date: 09/07/2016 CLINICAL DATA:  Status post thoracentesis EXAM: CHEST 1 VIEW COMPARISON:  09/07/2016 at 1234 hours FINDINGS: Small left pleural effusion, decreased status post thoracentesis. No pneumothorax is seen. Moderate layering right pleural  effusion. Mild patchy right lower lobe opacity, likely atelectasis. Cardiomegaly. Right arm PICC terminates at the cavoatrial junction. IMPRESSION: Small left pleural effusion, decreased status post thoracentesis. No pneumothorax is seen. Moderate layering right pleural effusion. Electronically Signed   By: Julian Hy M.D.   On: 09/07/2016 16:26   Dg Chest 1 View  Result Date: 09/07/2016 CLINICAL DATA:  Recent abdominal surgery, shortness of breath, hypoxia, CHF EXAM: CHEST 1 VIEW COMPARISON:  09/02/2016 FINDINGS: Limited portable semi upright exam. Large hiatal hernia projects over the cardiac silhouette with an air-fluid level. Moderate to large pleural effusions present with bibasilar collapse/ consolidation. Dense consolidation throughout the left lung. The cardiac silhouette is obscured. Aorta is atherosclerotic. Right PICC line tip at the lower SVC level. IMPRESSION: Enlarging pleural effusions with bilateral worsening collapse/consolidation. CHF is favored however difficult to exclude developing pneumonia. Large hiatal hernia Aortic atherosclerosis Electronically Signed   By: Jerilynn Mages.  Shick M.D.   On: 09/07/2016 13:22   Dg Chest Port 1 View  Result Date: 09/08/2016 CLINICAL DATA:  Acute respiratory failure EXAM: PORTABLE CHEST 1 VIEW COMPARISON:  09/07/2016 FINDINGS: Hazy opacities in both lungs consistent with layering effusions. Probable interstitial edema superimposed on the effusions. Cardiomegaly is noted. Aortic atherosclerosis is seen. Right-sided PICC line tip terminates the distal SVC. Small IV catheter at the base the neck on the left. IMPRESSION: Cardiomegaly with CHF and bilateral layering pleural effusions. Aortic atherosclerosis. PICC line tip in the distal SVC. Electronically Signed  By: Ashley Royalty M.D.   On: 09/08/2016 03:59   US Thoracentesis Asp Pleural Space W/img Guide  Result Date: 09/08/2016 INDICATION: Shortness of breath and left pleural effusion. EXAM: ULTRASOUND  GUIDED LEFT THORACENTESIS MEDICATIONS: None. COMPLICATIONS: None immediate. PROCEDURE: An ultrasound guided thoracentesis was thoroughly discussed with the patient and family and questions answered. The benefits, risks, alternatives and complications were also discussed. The patient understands and wishes to proceed with the procedure. Written consent was obtained. Ultrasound was performed to localize and mark an adequate pocket of fluid in the left posterior chest. The area was then prepped and draped in the normal sterile fashion. 1% Lidocaine was used for local anesthesia. Under ultrasound guidance a 6 Fr Safe-T-Centesis catheter was introduced. Thoracentesis was performed. The catheter was removed and a dressing applied. FINDINGS: A total of approximately 700 mL of yellow fluid was removed. Samples were sent to the laboratory as requested by the clinical team. IMPRESSION: Successful ultrasound guided left thoracentesis yielding 700 mL of pleural fluid. Electronically Signed   By: Markus Daft M.D.   On: 09/08/2016 07:43    Anti-infectives: Anti-infectives    Start     Dose/Rate Route Frequency Ordered Stop   09/08/16 0600  vancomycin (VANCOCIN) IVPB 750 mg/150 ml premix  Status:  Discontinued     750 mg 150 mL/hr over 60 Minutes Intravenous Every 12 hours 09/07/16 2123 09/08/16 0922   09/07/16 2300  vancomycin (VANCOCIN) IVPB 1000 mg/200 mL premix     1,000 mg 200 mL/hr over 60 Minutes Intravenous  Once 09/07/16 2123 09/08/16 0017   09/12/2016 0900  piperacillin-tazobactam (ZOSYN) IVPB 3.375 g  Status:  Discontinued     3.375 g 12.5 mL/hr over 240 Minutes Intravenous Every 8 hours 08/15/2016 0337 08/18/2016 0434   08/17/2016 0900  piperacillin-tazobactam (ZOSYN) IVPB 3.375 g     3.375 g 12.5 mL/hr over 240 Minutes Intravenous Every 8 hours 08/30/2016 0434        Assessment/Plan: s/p Procedure(s): CYSTOSCOPY WITH bilateral RETROGRADE PYELOGRAM, diagnostic URETEROSCOPY, exploratory laparotomy with  reimplantation/repair of left ureter EXPLORATORY LAPAROTOMY   Chest x-rays are reviewed. Appreciate critical care medicine input. Patient is currently on TPN and tolerating a full liquid diet. Her respiratory status is still questionable likely secondary to CHF. We'll defer to CCM for management at this point  Discussed with patient and husband  Florene Glen, MD, FACS  09/08/2016

## 2016-09-08 NOTE — Progress Notes (Signed)
8 Days Post-Op  Subjective:  1 - Left Ureteral Injury- s/p open LEFT ureteral reimplant with bilat retrograde, and left JJ stent 12/18 by Louis Meckel as primary repair of delayed left ureteral injury after hemicolectomy. KUB 12/23 with JJ stent and surgical drain in good position.  Today "Katherine Medina" is critically ill. Hgb improved some but significant CHF with pulm congestion. Remains w/o assisted ventilation due to careful fluid management.   Objective: Vital signs in last 24 hours: Temp:  [97.8 F (36.6 C)-98.6 F (37 C)] 98 F (36.7 C) (12/26 0200) Pulse Rate:  [76-109] 109 (12/26 0700) Resp:  [20-33] 33 (12/26 0700) BP: (92-145)/(37-65) 126/52 (12/26 0700) SpO2:  [87 %-100 %] 100 % (12/26 0700) FiO2 (%):  [45 %-100 %] 45 % (12/26 0344) Weight:  [81.9 kg (180 lb 8.9 oz)] 81.9 kg (180 lb 8.9 oz) (12/26 0434) Last BM Date: 09/07/16  Intake/Output from previous day: 12/25 0701 - 12/26 0700 In: 3144.5 [P.O.:90; I.V.:2544.5; IV Piggyback:500] Out: [id number] [Urine:4050; Drains:35] Intake/Output this shift: No intake/output data recorded.  General appearance: ill-appearing in ICU, less vigorous than past few days. Tired.  Eyes: negative Nose: Nares normal. Septum midline. Mucosa normal. No drainage or sinus tenderness. Throat: lips, mucosa, and tongue normal; teeth and gums normal Neck: supple, symmetrical, trachea midline Back: symmetric, no curvature. ROM normal. No CVA tenderness. Resp: increased rate but not labored on [place] O2.  Cardio: regular by bedside monitor with HR mid 80s.  GI: soft, non-tender; bowel sounds normal; no masses,  no organomegaly Pelvic: external genitalia normal and foley c/d/i with clear yellow urine that is less proteinacious.  Extremities: LE > UE edema that is soft. RUE PICC in place.  Lymph nodes: Cervical, supraclavicular, and axillary nodes normal. Neurologic: Grossly normal  Lab Results:   Recent Labs  09/07/16 1346 09/08/16 0311  WBC 20.1* 19.0*   HGB 8.5* 7.9*  HCT 25.3* 23.7*  PLT 447* 430   BMET  Recent Labs  09/07/16 0648 09/08/16 0311  NA 133* 138  K 4.1 3.3*  CL 102 105  CO2 28 29  GLUCOSE 136* 122*  BUN 15 17  CREATININE 0.37* 0.55  CALCIUM 7.0* 7.2*   PT/INR  Recent Labs  09/07/16 1401  LABPROT 15.3*  INR 1.20   ABG  Recent Labs  09/07/16 1918  PHART 7.54*  HCO3 29.1*    Studies/Results: Dg Chest 1 View  Result Date: 09/07/2016 CLINICAL DATA:  Status post thoracentesis EXAM: CHEST 1 VIEW COMPARISON:  09/07/2016 at 1234 hours FINDINGS: Small left pleural effusion, decreased status post thoracentesis. No pneumothorax is seen. Moderate layering right pleural effusion. Mild patchy right lower lobe opacity, likely atelectasis. Cardiomegaly. Right arm PICC terminates at the cavoatrial junction. IMPRESSION: Small left pleural effusion, decreased status post thoracentesis. No pneumothorax is seen. Moderate layering right pleural effusion. Electronically Signed   By: Julian Hy M.D.   On: 09/07/2016 16:26   Dg Chest 1 View  Result Date: 09/07/2016 CLINICAL DATA:  Recent abdominal surgery, shortness of breath, hypoxia, CHF EXAM: CHEST 1 VIEW COMPARISON:  09/02/2016 FINDINGS: Limited portable semi upright exam. Large hiatal hernia projects over the cardiac silhouette with an air-fluid level. Moderate to large pleural effusions present with bibasilar collapse/ consolidation. Dense consolidation throughout the left lung. The cardiac silhouette is obscured. Aorta is atherosclerotic. Right PICC line tip at the lower SVC level. IMPRESSION: Enlarging pleural effusions with bilateral worsening collapse/consolidation. CHF is favored however difficult to exclude developing pneumonia. Large hiatal hernia Aortic atherosclerosis  Electronically Signed   By: Jerilynn Mages.  Shick M.D.   On: 09/07/2016 13:22   Dg Chest Port 1 View  Result Date: 09/08/2016 CLINICAL DATA:  Acute respiratory failure EXAM: PORTABLE CHEST 1 VIEW  COMPARISON:  09/07/2016 FINDINGS: Hazy opacities in both lungs consistent with layering effusions. Probable interstitial edema superimposed on the effusions. Cardiomegaly is noted. Aortic atherosclerosis is seen. Right-sided PICC line tip terminates the distal SVC. Small IV catheter at the base the neck on the left. IMPRESSION: Cardiomegaly with CHF and bilateral layering pleural effusions. Aortic atherosclerosis. PICC line tip in the distal SVC. Electronically Signed   By: Ashley Royalty M.D.   On: 09/08/2016 03:59    Anti-infectives: Anti-infectives    Start     Dose/Rate Route Frequency Ordered Stop   09/08/16 0600  vancomycin (VANCOCIN) IVPB 750 mg/150 ml premix     750 mg 150 mL/hr over 60 Minutes Intravenous Every 12 hours 09/07/16 2123     09/07/16 2300  vancomycin (VANCOCIN) IVPB 1000 mg/200 mL premix     1,000 mg 200 mL/hr over 60 Minutes Intravenous  Once 09/07/16 2123 09/08/16 0017   09/03/2016 0900  piperacillin-tazobactam (ZOSYN) IVPB 3.375 g  Status:  Discontinued     3.375 g 12.5 mL/hr over 240 Minutes Intravenous Every 8 hours 09/06/2016 0337 09/09/2016 0434   08/14/2016 0900  piperacillin-tazobactam (ZOSYN) IVPB 3.375 g     3.375 g 12.5 mL/hr over 240 Minutes Intravenous Every 8 hours 09/04/2016 0434        Assessment/Plan:  1 - Left Ureteral Injury- remains stable from GU perspective. Would keep all current tubes / drains until ileus completely resolved and tollerating PO well and out of ICT, then consider sequential removal. We will manage.  [facility], Rachele Lamaster 09/08/2016

## 2016-09-08 NOTE — Progress Notes (Signed)
[facility] [place] NAME: Katherine Medina    MR#:  [id number]  DATE OF BIRTH:  01-Dec-1928  SUBJECTIVE:  CHIEF COMPLAINT:   Chief Complaint  Patient presents with  . Post-op Problem    Transfer from [facility]  breathing somewhat better, on [place] O2. Daughter at bedside REVIEW OF SYSTEMS:  Review of Systems  Constitutional: Negative for chills, fever and weight loss.  HENT: Negative for nosebleeds and sore throat.   Eyes: Negative for blurred vision.  Respiratory: Negative for cough, shortness of breath and wheezing.   Cardiovascular: Negative for chest pain, orthopnea, leg swelling and PND.  Gastrointestinal: Positive for abdominal pain. Negative for constipation, diarrhea, heartburn, nausea and vomiting.  Genitourinary: Negative for dysuria and urgency.  Musculoskeletal: Negative for back pain.  Skin: Negative for rash.  Neurological: Negative for dizziness, speech change, focal weakness and headaches.  Endo/Heme/Allergies: Does not bruise/bleed easily.  Psychiatric/Behavioral: Negative for depression.    DRUG ALLERGIES:  No Known Allergies VITALS:  Blood pressure (!) 123/45, pulse (!) 118, temperature 98 F (36.7 C), temperature source Oral, resp. rate (!) 39, height 5\' 4"  (1.626 m), weight 81.9 kg (180 lb 8.9 oz), SpO2 100 %. PHYSICAL EXAMINATION:  Physical Exam  Constitutional: She is oriented to person, place, and time. She appears malnourished. She appears unhealthy. She appears cachectic. She appears toxic. She has a sickly appearance.  HENT:  Head: Normocephalic and atraumatic.  Eyes: Conjunctivae and EOM are normal. Pupils are equal, round, and reactive to light.  Neck: Normal range of motion. Neck supple. No tracheal deviation present. No thyromegaly present.  Cardiovascular: Normal rate, regular rhythm and normal heart sounds.   Pulmonary/Chest: Accessory muscle usage present. Tachypnea noted. She is in respiratory distress.  She has decreased breath sounds. She has no wheezes. She has rhonchi in the right lower field. She exhibits no tenderness.  Abdominal: Soft. Bowel sounds are normal. She exhibits no distension. There is generalized tenderness.  right abdominal jp drain intact draining serosanguinous fluid   Musculoskeletal: Normal range of motion.  Neurological: She is alert and oriented to person, place, and time. No cranial nerve deficit.  Skin: Skin is warm and dry. No rash noted.  Psychiatric: Mood and affect normal.   LABORATORY PANEL:   CBC  Recent Labs Lab 09/08/16 0311  WBC 19.0*  HGB 7.9*  HCT 23.7*  PLT 430   ------------------------------------------------------------------------------------------------------------------ Chemistries   Recent Labs Lab 09/08/16 0311  NA 138  K 3.3*  CL 105  CO2 29  GLUCOSE 122*  BUN 17  CREATININE 0.55  CALCIUM 7.2*  MG 1.7  AST 27  ALT 13*  ALKPHOS 91  BILITOT 0.4   RADIOLOGY:  Dg Chest 1 View  Result Date: 09/07/2016 CLINICAL DATA:  Status post thoracentesis EXAM: CHEST 1 VIEW COMPARISON:  09/07/2016 at 1234 hours FINDINGS: Small left pleural effusion, decreased status post thoracentesis. No pneumothorax is seen. Moderate layering right pleural effusion. Mild patchy right lower lobe opacity, likely atelectasis. Cardiomegaly. Right arm PICC terminates at the cavoatrial junction. IMPRESSION: Small left pleural effusion, decreased status post thoracentesis. No pneumothorax is seen. Moderate layering right pleural effusion. Electronically Signed   By: Julian Hy M.D.   On: 09/07/2016 16:26   Dg Chest Port 1 View  Result Date: 09/08/2016 CLINICAL DATA:  Acute respiratory failure EXAM: PORTABLE CHEST 1 VIEW COMPARISON:  09/07/2016 FINDINGS: Hazy opacities in both lungs consistent with layering effusions. Probable interstitial edema superimposed on  the effusions. Cardiomegaly is noted. Aortic atherosclerosis is seen. Right-sided PICC line  tip terminates the distal SVC. Small IV catheter at the base the neck on the left. IMPRESSION: Cardiomegaly with CHF and bilateral layering pleural effusions. Aortic atherosclerosis. PICC line tip in the distal SVC. Electronically Signed   By: Ashley Royalty M.D.   On: 09/08/2016 03:59   US Thoracentesis Asp Pleural Space W/img Guide  Result Date: 09/08/2016 INDICATION: Shortness of breath and left pleural effusion. EXAM: ULTRASOUND GUIDED LEFT THORACENTESIS MEDICATIONS: None. COMPLICATIONS: None immediate. PROCEDURE: An ultrasound guided thoracentesis was thoroughly discussed with the patient and family and questions answered. The benefits, risks, alternatives and complications were also discussed. The patient understands and wishes to proceed with the procedure. Written consent was obtained. Ultrasound was performed to localize and mark an adequate pocket of fluid in the left posterior chest. The area was then prepped and draped in the normal sterile fashion. 1% Lidocaine was used for local anesthesia. Under ultrasound guidance a 6 Fr Safe-T-Centesis catheter was introduced. Thoracentesis was performed. The catheter was removed and a dressing applied. FINDINGS: A total of approximately 700 mL of yellow fluid was removed. Samples were sent to the laboratory as requested by the clinical team. IMPRESSION: Successful ultrasound guided left thoracentesis yielding 700 mL of pleural fluid. Electronically Signed   By: Markus Daft M.D.   On: 09/08/2016 07:43   ASSESSMENT AND PLAN:   * Acute hypoxic respiratory failure secondary to bilateral pleural effusions and atelectasis s/p hemicolectomy and hysterectomy with injuries to urinary tract - continue O2 via [place] - Pulmo c/s and following - daily CXR for now - Vanco + Zosyn  * Hypokalemia - replete and recheck  * Acute on chronic anemia - monitor  *  surgery for hemicolectomy with injuries to urinary tract and urologic surgery.   Continue management as  per primary team.   Vanco + Zosyn.  * Severe protein-calorie malnutrition   Albumin is 1.3, patient is on TPN.   Continue monitoring.  * Leukocytosis - monitor while on Abx   All the records are reviewed and case discussed with Care Management/Social Worker. Management plans discussed with the patient, family and they are in agreement.  CODE STATUS: FULL CODE  TOTAL TIME (critical care) TAKING CARE OF THIS PATIENT: 35 minutes.   More than 50% of the time was spent in counseling/coordination of care: YES   Max Sane M.D on 09/08/2016 at 2:43 PM  Between 7am to 6pm - Pager - [phone]  After 6pm go to [email or web address] - Proofreader  [facility]  Office  [phone]  CC: Primary care physician; Madelyn Brunner, MD  Note: This dictation was prepared with Dragon dictation along with smaller phrase technology. Any transcriptional errors that result from this process are unintentional.

## 2016-09-08 NOTE — Progress Notes (Signed)
RN made Dr. Alva Garnet aware in rounds that patient's blood pressure 111/47 map 61 and that IV lasix and PO metoprolol are due.  MD stated "dont give the metoprolol and give the lasix."  MD also gave order for sputum culture.

## 2016-09-08 NOTE — Progress Notes (Signed)
[place] NOTE   Pharmacy Consult for TPN  Indication: Prolonged ileus    Patient Measurements: Height: 5\' 4"  (162.6 cm) Weight: 180 lb 8.9 oz (81.9 kg) IBW/kg (Calculated) : 54.7 TPN AdjBW (KG): 59.7 Body mass index is 30.99 kg/m.   Assessment: Pharmacy consulted to assist in the management of electrolytes and glucose in this  82 year old woman receiving TPN for postop ileus.  GI: NPO Endo:  Insulin requirements in the past 24 hours: 15 units  Lytes: Renal: Scr 0.37 Pulm: Cards:  Hepatobil: Neuro: ID:  Best Practices: TPN Access: TPN start date: 12/21  Nutritional Goals (per RD recommendation on 2223 calories ): kCal: Protein: 110 gm   Current Nutrition: NPO, TPN  Plan:  Patient requiring supplemental electrolytes due to Clinimix 5/15 not having electrolytes. Patient requiring furosemide. Will replace potassium phosphate 67mmol IV x 1 for phosphorus of 1.7 and potassium of 3.3. Will order supplemental magnesium 2g IV x 1 to maintain magnesium > 2. Will transition patient to Clinimix E 5/20 @ 58mL/hr with added MVI, trace and Thiamine 100mg  per bag. Will continue Q4h SSI. Will continue 20% lipid emulsion to infuse over 12 hours daily.   Pharmacy will continue to monitor and adjust per consult.   Yancy Knoble L 09/08/2016,10:59 AM

## 2016-09-08 NOTE — Progress Notes (Addendum)
Nutrition Follow-up  DOCUMENTATION CODES:   Not applicable  INTERVENTION:  -TPN: 5%AA/15%Dextrose WITH electrolytes is no longer available. Recommend switching to 5%AA/20%Dextrose at rate of 70 ml/hr and continuing 20% Lipids daily at rate of 20 ml/hr for 12 hours providing 1958 kcals, 84 g of protein and 1920 mL of fluid. This will provide less total fluid volume. Potassium and phosphorus being supplemented. Discussed plan with pharmacy. Continue to assess   NUTRITION DIAGNOSIS:   Inadequate oral intake related to inability to eat as evidenced by NPO status.  GOAL:   Patient will meet greater than or equal to 90% of their needs  MONITOR:   Diet advancement, Labs, Weight trends, I & O's  REASON FOR ASSESSMENT:   Consult New TPN/TNA  ASSESSMENT:   82 year old female who is status post laparoscopic hand-assisted right hemicolectomy and hysterectomy who was postop day #11 on admit. She presented to the [facility] emergency department with acute onset abdominal pain. Now s/p open LEFT ureteral reimplant with bilat retrograde, and left JJ stent 12/18 by Louis Meckel as primary repair of delayed left ureteral injury after hemicolectomy. KUB 12/23 with JJ stent and surgical drain in good position. Pt also with prolonged ileus.  Pt developed respiratory distress requiring transfer to ICU, pt with pleural effusion and received US guided thoracentesis with 700 mL fluid removed  5%AA/15%Dextrose (without electrolytes) at rate of 83 ml/hr with 20% lipids at 20 ml/hr for 12 hours UOP 4 L in past 24 hours; pt with 1-2+ generalized edema. Weight relatively stable; pt weighed 180 pounds from 12/22to 12/24, up to 184 on 12/25 and now back down to 180 pounds Pt with poor appetite on FL diet  Labs: potassium 3.3 (supplemented), phosphorus 1.7, magnesium wdl, sodium wdl, CL wdl, FSBS 130s-150s Meds: ss novolog, lasix (one time dose)  Diet Order:  Diet full liquid Room service appropriate? Yes; Fluid  consistency: Thin TPN (CLINIMIX) Adult without lytes  Skin:  Wound (see comment) (Abdominal surgical wounds)  Last BM:  12/25  Height:   Ht Readings from Last 1 Encounters:  08/16/2016 5\' 4"  (1.626 m)    Weight:   Wt Readings from Last 1 Encounters:  09/08/16 180 lb 8.9 oz (81.9 kg)   Filed Weights   09/06/16 0506 09/07/16 0500 09/08/16 0434  Weight: 180 lb 1.6 oz (81.7 kg) 184 lb 12.8 oz (83.8 kg) 180 lb 8.9 oz (81.9 kg)    Ideal Body Weight:  54.54 kg  BMI:  Body mass index is 30.99 kg/m.  Estimated Nutritional Needs:   Kcal:  [id number] kcals  Protein:  82-123 g  Fluid:  >/= 1.8 L  EDUCATION NEEDS:   No education needs identified at this time  [place], [place], [place] [phone] Pager  [phone] Weekend/On-Call Pager

## 2016-09-08 NOTE — Progress Notes (Signed)
Pt is on 2L [place] and in no distress. Bipap is not in room at this time. Bipap is not needed at this time.

## 2016-09-08 NOTE — Progress Notes (Signed)
Chaplain rounded the unit to provide a compassionate presence and spiritual support to the patient and family. Chaplain, patient and family prayed together. Katherine Medina [phone]

## 2016-09-08 NOTE — Evaluation (Signed)
Physical Therapy Re-Evaluation Patient Details Name: Katherine Medina MRN: [id number] DOB: Jun 13, 1929 Today's Date: 09/08/2016   History of Present Illness  Pt admitted for complaints of abdominal pain and N/V. Noted pelvic fluid collection in abdomen. Pt with recent hospital stay due to hysterectomy and hemicolectomy on 12/7. Pt now with L ureteral injury, drained fluid, and is now s/p procedure to reimplant transected ureter into bladder with stent placement. On 12/25 pt transferred to the CCU due to acute hypoxic respiratory failure due to Bil pleural effusions requiring BiPAP and L thoracentesis performed on 12/25.      Clinical Impression  Re-evaluation completed as pt with change in medical status and was transferred to the CCU since last session.  MD requested PT session today and placed new order.  Pt presents with decline in overall strength (LEs>UEs) and was quick to fatigue today. Pt tolerated light BUE therapeutic exercies well although with faitgue; however, once attempted LLE light exercise with assisted SLR pt's HR rose as high as 147.  Therapeutic exercise was stopped immediately and pt allowed to rest.  HR remained in the high 130s for the next 4 minutes and RN notified.  SpO2 remained at or above 96% on 2L O2 throughout session and was 100% at end of session.  Pt will benefit from continued skilled PT services to increase functional independence and safety.  Current follow up recommendation is for [facility] but will continue update recommendation as appropriate as pt progresses.    Follow Up Recommendations SNF;LTACH (SNF vs. LTACH pending progress)    Equipment Recommendations  Other (comment) (TBD at next venue of care)    Recommendations for Other Services       Precautions / Restrictions Precautions Precautions: Fall;Other (comment) Precaution Comments: Monitor HR, O2. PICC line RUE Restrictions Weight Bearing Restrictions: No      Mobility  Bed Mobility               General bed mobility comments: Did not attempt due to pt's level of fatigue and elevated HR with light exercises  Transfers                    Ambulation/Gait                Stairs            Wheelchair Mobility    Modified Rankin (Stroke Patients Only)       Balance                                             Pertinent Vitals/Pain Pain Assessment: No/denies pain Pain Intervention(s): Limited activity within patient's tolerance;Monitored during session    [place] expects to be discharged to:: Private residence Living Arrangements: Spouse/significant other Available Help at Discharge: Family;Available 24 hours/day Type of Home: House Home Access: Stairs to enter Entrance Stairs-Rails: Can reach both Entrance Stairs-Number of Steps: 5 Home Layout: One level Home Equipment: Tub bench;Walker - 2 wheels;Bedside commode      Prior Function Level of Independence: Independent with assistive device(s)         Comments: PTA pt was independent using RW at home. Hadn't started HHPT yet.  She was seen by PT during this admission and was requiring max assist for bed mobility and could tolerate sitting EOB ~1 minute.     Hand Dominance  Extremity/Trunk Assessment   Upper Extremity Assessment Upper Extremity Assessment: RUE deficits/detail;LUE deficits/detail RUE Deficits / Details: Strength grossly 3/5  LUE Deficits / Details: Strength grossly 3/5    Lower Extremity Assessment Lower Extremity Assessment: RLE deficits/detail;LLE deficits/detail RLE Deficits / Details: Strength grossly 2+/5 LLE Deficits / Details: Strength grossly 2+/5       Communication   Communication: No difficulties  Cognition Arousal/Alertness: Lethargic Behavior During Therapy: Flat affect Overall Cognitive Status: Within Functional Limits for tasks assessed                      General Comments General comments  (skin integrity, edema, etc.): Spoke with MD and RN prior to session about seeing pt and MD approved PT Re-evaluation and placed new order.  Pt received resting supine in bed with HR ranging 98-110.  BP [id number] systolic at start of session and 117/50s at end of session.  Pt tolerated light BUE therapeutic exercies well although with faitgue; however, once attempted LLE light exercise with assisted SLR pt's HR rose as high as 147.  Therapeutic exercise was stopped immediately and pt allowed to rest.  HR remained in the high 130s for the next 4 minutes and RN notified.  Pt encouraged to rest and to take deep breaths in her nose and out her mouth.  RR from the 20s to the high 30s thrughout session.  SpO2 remained at or above 96% on 2L O2 throughout session.    Exercises General Exercises - Upper Extremity Shoulder Flexion: AROM;Both;10 reps;Supine Elbow Flexion: Strengthening;Both;10 reps;Supine;Other (comment) (with very light manual resistance) Elbow Extension: Strengthening;Both;10 reps;Supine (with very light manual resistance) General Exercises - Lower Extremity Straight Leg Raises: AAROM;Left;Other (comment);Supine;Other reps (comment) (8 reps before HR elevated into the 130s) Other Exercises Other Exercises: Demonstrated and cues for pursed lip breathing which pt demonstrated back to this PT.  Pt encouraged to perform throughout session but required mod verbal cues to do so.   Assessment/Plan    PT Assessment Patient needs continued PT services  PT Problem List Decreased strength;Decreased activity tolerance;Decreased balance;Decreased mobility;Decreased knowledge of use of DME;Decreased safety awareness;Decreased knowledge of precautions;Cardiopulmonary status limiting activity          PT Treatment Interventions DME instruction;Gait training;Stair training;Functional mobility training;Therapeutic activities;Therapeutic exercise;Balance training;Neuromuscular re-education;Patient/family  education;Wheelchair mobility training    PT Goals (Current goals can be found in the Care Plan section)  Acute Rehab PT Goals Patient Stated Goal: to get stronger PT Goal Formulation: With patient Time For Goal Achievement: 09/22/16 Potential to Achieve Goals: Good    Frequency Min 2X/week   Barriers to discharge        Co-evaluation               End of Session Equipment Utilized During Treatment: Oxygen               Time: [id number] PT Time Calculation (min) (ACUTE ONLY): 19 min   Charges:   PT Evaluation $PT Re-evaluation: 1 Procedure     PT G CodesCollie Siad PT, DPT 09/08/2016, 3:55 PM

## 2016-09-09 ENCOUNTER — Inpatient Hospital Stay: Payer: Medicare Other

## 2016-09-09 LAB — TYPE AND SCREEN
BLOOD PRODUCT EXPIRATION DATE: 201712282359
BLOOD PRODUCT EXPIRATION DATE: 201712282359
Blood Product Expiration Date: 201712272359
ISSUE DATE / TIME: 201712231602
ISSUE DATE / TIME: 201712261038
UNIT TYPE AND RH: 9500
UNIT TYPE AND RH: 9500
Unit Type and Rh: 9500

## 2016-09-09 LAB — BASIC METABOLIC PANEL
ANION GAP: 5 (ref 5–15)
BUN: 16 mg/dL (ref 6–20)
CALCIUM: 7.1 mg/dL — AB (ref 8.9–10.3)
CHLORIDE: 103 mmol/L (ref 101–111)
CO2: 30 mmol/L (ref 22–32)
Creatinine, Ser: 0.6 mg/dL (ref 0.44–1.00)
GFR calc non Af Amer: >60 mL/min (ref >60)
Glucose, Bld: 127 mg/dL — ABNORMAL HIGH (ref 65–99)
Potassium: 3.9 mmol/L (ref 3.5–5.1)
Sodium: 138 mmol/L (ref 135–145)

## 2016-09-09 LAB — GLUCOSE, CAPILLARY
GLUCOSE-CAPILLARY: 117 mg/dL — AB (ref 65–99)
GLUCOSE-CAPILLARY: 134 mg/dL — AB (ref 65–99)
GLUCOSE-CAPILLARY: 105 mg/dL — AB (ref 65–99)
Glucose-Capillary: 129 mg/dL — ABNORMAL HIGH (ref 65–99)
Glucose-Capillary: 117 mg/dL — ABNORMAL HIGH (ref 65–99)

## 2016-09-09 LAB — CBC
HCT: 21.7 % — ABNORMAL LOW (ref 35.0–47.0)
Hemoglobin: 7.2 g/dL — ABNORMAL LOW (ref 12.0–16.0)
MCH: 27.3 pg (ref 26.0–34.0)
MCHC: 33.2 g/dL (ref 32.0–36.0)
MCV: 82.2 fL (ref 80.0–100.0)
PLATELETS: 439 10*3/uL (ref 150–440)
RBC: 2.64 MIL/uL — AB (ref 3.80–5.20)
RDW: 14.7 % — ABNORMAL HIGH (ref 11.5–14.5)
WBC: 18.5 10*3/uL — AB (ref 3.6–11.0)

## 2016-09-09 LAB — PROCALCITONIN: Procalcitonin: 0.98 ng/mL

## 2016-09-09 LAB — PHOSPHORUS: PHOSPHORUS: 3 mg/dL (ref 2.5–4.6)

## 2016-09-09 LAB — PH, BODY FLUID: PH, BODY FLUID: 7.6

## 2016-09-09 LAB — MISC LABCORP TEST (SEND OUT): LABCORP TEST CODE: 19588

## 2016-09-09 LAB — CYTOLOGY - NON PAP

## 2016-09-09 MED ORDER — VANCOMYCIN HCL 10 G IV SOLR
1250.0000 mg | INTRAVENOUS | Status: DC
  Administered 2016-09-09 – 2016-09-11 (×4): 1250 mg via INTRAVENOUS
  Filled 2016-09-09 (×4): qty 1250

## 2016-09-09 MED ORDER — VANCOMYCIN HCL IN DEXTROSE 1-5 GM/200ML-% IV SOLN
1000.0000 mg | Freq: Once | INTRAVENOUS | Status: AC
  Administered 2016-09-09: 1000 mg via INTRAVENOUS
  Filled 2016-09-09: qty 200

## 2016-09-09 MED ORDER — FUROSEMIDE 10 MG/ML IJ SOLN
20.0000 mg | Freq: Two times a day (BID) | INTRAMUSCULAR | Status: AC
Start: 2016-09-09 — End: 2016-09-09
  Administered 2016-09-09 (×2): 20 mg via INTRAVENOUS
  Filled 2016-09-09 (×2): qty 2

## 2016-09-09 MED ORDER — TRACE MINERALS CR-CU-MN-SE-ZN 10-1000-500-60 MCG/ML IV SOLN
INTRAVENOUS | Status: AC
  Administered 2016-09-09: 20:00:00 via INTRAVENOUS
  Filled 2016-09-09: qty 1680

## 2016-09-09 MED ORDER — TRACE MINERALS CR-CU-MN-SE-ZN 10-1000-500-60 MCG/ML IV SOLN
INTRAVENOUS | Status: DC
  Filled 2016-09-09: qty 1680

## 2016-09-09 MED ORDER — FAT EMULSION 20 % IV EMUL
250.0000 mL | INTRAVENOUS | Status: AC
  Administered 2016-09-10: 250 mL via INTRAVENOUS
  Filled 2016-09-09 (×2): qty 250

## 2016-09-09 MED ORDER — ACETAMINOPHEN 500 MG PO TABS
1000.0000 mg | ORAL_TABLET | Freq: Four times a day (QID) | ORAL | Status: DC | PRN
  Administered 2016-09-09 – 2016-09-11 (×3): 1000 mg via ORAL
  Filled 2016-09-09 (×3): qty 2

## 2016-09-09 NOTE — Progress Notes (Signed)
S/p ureteral re implantation for left transection after hysterectomy Resp status slowly improving still on [place] Lopressor on hold due to labile BP Great response to lasix w good U/O no output from JP Now febrile this am with increase sputum, CXR collapse of RLL given her prolonged hospitalization I suspect pneumonia and will add vancomycin  PE Debilitated and malnourished  Chest Decrease BS on right base, sinus tachy Abd: soft, incisions c/d/i, JP milked and a clot removed. Urine w some sediment  A/P Add vanco for pneumonia Lasix today x 2 Lopressor BC Continue ICU care for at least one more day until she stabilizes If fevers persist will order CT A/P to r/o undrained collections Continue TPN since her PO intake is minimal

## 2016-09-09 NOTE — Progress Notes (Signed)
Nutrition Follow-up  DOCUMENTATION CODES:   Not applicable  INTERVENTION:  -Recommend continuing current TPN regimen as tolerated by pt -Encourage po intake, cater to pt preferences -Recommend addition of Mighty Shakes, Magic Cup supplements  NUTRITION DIAGNOSIS:   Inadequate oral intake related to inability to eat as evidenced by NPO status.  Being addressed via TPN  GOAL:   Patient will meet greater than or equal to 90% of their needs  MONITOR:   Diet advancement, Labs, Weight trends, I & O's  REASON FOR ASSESSMENT:   Consult New TPN/TNA  ASSESSMENT:   5%AA/20%Dextrose infusing at rate of 70 ml/hr via PICC line; 20% Lipids daily at rate of 20 ml/hr for 12 hours   UOP 4275 mL in 24 hours; weight trending down but pt being diuresed. Pt still with generalized edema  Appetite remains poor, po intake minimal  Labs: electrolytes wdl, FSBS 117-157 Meds: lasix, ss novolog  Diet Order:  Diet full liquid Room service appropriate? Yes; Fluid consistency: Thin .TPN (CLINIMIX-E) Adult .TPN (CLINIMIX-E) Adult  Skin:  Wound (see comment) (Abdominal surgical wounds)  Last BM:  12/25  Height:   Ht Readings from Last 1 Encounters:  08/29/2016 5\' 4"  (1.626 m)    Weight:   Wt Readings from Last 1 Encounters:  09/09/16 175 lb 11.3 oz (79.7 kg)   Filed Weights   09/07/16 0500 09/08/16 0434 09/09/16 0415  Weight: 184 lb 12.8 oz (83.8 kg) 180 lb 8.9 oz (81.9 kg) 175 lb 11.3 oz (79.7 kg)     Ideal Body Weight:  54.54 kg  BMI:  Body mass index is 30.16 kg/m.  Estimated Nutritional Needs:   Kcal:  [id number] kcals  Protein:  82-123 g  Fluid:  >/= 1.8 L  EDUCATION NEEDS:   No education needs identified at this time  [place], [place], [place] [phone] Pager  [phone] Weekend/On-Call Pager

## 2016-09-09 NOTE — Care Management (Addendum)
I have asked both [facility] and [facility] to review this case for LTAC to see if she meets criteria.  [facility] can accept patient; waiting on [facility]. I presented both facilities to patient's husband and two of their daughters (she has total of 5 children). They want to talk to Dr. Dahlia Byes first. I mentioned LTAC to Dr. Dahlia Byes and he was to keep patient a couple more days and will discuss this with patient/family today. Husband was updated about conversation with DR. Pabon.

## 2016-09-09 NOTE — Progress Notes (Deleted)
Per Pt Request, checked on status on visit from Vascular MD- per Vascular Lab, MD still in case at this time. Will continue to monitor and provided emotional support to patient as needed.Marland Kitchen

## 2016-09-09 NOTE — Progress Notes (Signed)
9 Days Post-Op  Subjective:  1 - Left Ureteral Injury- s/p open LEFT ureteral reimplant with bilat retrograde, and left JJ stent 12/18 by Louis Meckel as primary repair of delayed left ureteral injury after hemicolectomy. KUB 12/23 with JJ stent and surgical drain in good position.  Today "[place]o" is feeling better. SOB improving. Minimal appetite.  JP 35 cc/24 hr  Objective: Vital signs in last 24 hours: Temp:  [97.9 F (36.6 C)-100 F (37.8 C)] 97.9 F (36.6 C) (12/27 1200) Pulse Rate:  [82-137] 90 (12/27 1200) Resp:  [19-39] 21 (12/27 1200) BP: (104-123)/(38-55) 109/39 (12/27 1200) SpO2:  [96 %-100 %] 98 % (12/27 1200) Weight:  [175 lb 11.3 oz (79.7 kg)] 175 lb 11.3 oz (79.7 kg) (12/27 0415) Last BM Date:  (no BM this hospitilization not taking solid foods)  Intake/Output from previous day: 12/26 0701 - 12/27 0700 In: 2948.7 [P.O.:120; I.V.:1968.7; IV Piggyback:710] Out: [id number] [Urine:4275] Intake/Output this shift: Total I/O In: -  Out: 250 [Urine:250]  NAD nonlabored resp Soft approp T mild D bandage c/d/i JP SS Urine clear yellow  Lab Results:   Recent Labs  09/08/16 0311 09/09/16 0350  WBC 19.0* 18.5*  HGB 7.9* 7.2*  HCT 23.7* 21.7*  PLT 430 439   BMET  Recent Labs  09/08/16 0311 09/09/16 0350  NA 138 138  K 3.3* 3.9  CL 105 103  CO2 29 30  GLUCOSE 122* 127*  BUN 17 16  CREATININE 0.55 0.60  CALCIUM 7.2* 7.1*   PT/INR  Recent Labs  09/07/16 1401  LABPROT 15.3*  INR 1.20   ABG  Recent Labs  09/07/16 1918  PHART 7.54*  HCO3 29.1*    Studies/Results: Dg Chest 1 View  Result Date: 09/07/2016 CLINICAL DATA:  Status post thoracentesis EXAM: CHEST 1 VIEW COMPARISON:  09/07/2016 at 1234 hours FINDINGS: Small left pleural effusion, decreased status post thoracentesis. No pneumothorax is seen. Moderate layering right pleural effusion. Mild patchy right lower lobe opacity, likely atelectasis. Cardiomegaly. Right arm PICC terminates at the  cavoatrial junction. IMPRESSION: Small left pleural effusion, decreased status post thoracentesis. No pneumothorax is seen. Moderate layering right pleural effusion. Electronically Signed   By: Julian Hy M.D.   On: 09/07/2016 16:26   Dg Chest 1 View  Result Date: 09/07/2016 CLINICAL DATA:  Recent abdominal surgery, shortness of breath, hypoxia, CHF EXAM: CHEST 1 VIEW COMPARISON:  09/02/2016 FINDINGS: Limited portable semi upright exam. Large hiatal hernia projects over the cardiac silhouette with an air-fluid level. Moderate to large pleural effusions present with bibasilar collapse/ consolidation. Dense consolidation throughout the left lung. The cardiac silhouette is obscured. Aorta is atherosclerotic. Right PICC line tip at the lower SVC level. IMPRESSION: Enlarging pleural effusions with bilateral worsening collapse/consolidation. CHF is favored however difficult to exclude developing pneumonia. Large hiatal hernia Aortic atherosclerosis Electronically Signed   By: Jerilynn Mages.  Shick M.D.   On: 09/07/2016 13:22   Dg Chest Port 1 View  Result Date: 09/09/2016 CLINICAL DATA:  82 year old female with abdominal surgery earlier this month. Respiratory failure. Initial encounter. EXAM: PORTABLE CHEST 1 VIEW COMPARISON:  09/08/2016 and earlier. FINDINGS: Portable AP upright view at 0542 hours. Stable right PICC line. Continued bilateral pleural effusions with associated lower lobe collapse or consolidation. Mediastinal contours are stable. Calcified aortic atherosclerosis. Visualized tracheal air column is within normal limits. No pneumothorax. Visible bowel gas pattern is within normal limits. IMPRESSION: Bilateral pleural effusion with lower lobe collapse or consolidation. Ventilation not significantly changed allowing for differences in  patient positioning. Calcified aortic atherosclerosis. Electronically Signed   By: Genevie Ann M.D.   On: 09/09/2016 07:29   Dg Chest Port 1 View  Result Date:  09/08/2016 CLINICAL DATA:  Acute respiratory failure EXAM: PORTABLE CHEST 1 VIEW COMPARISON:  09/07/2016 FINDINGS: Hazy opacities in both lungs consistent with layering effusions. Probable interstitial edema superimposed on the effusions. Cardiomegaly is noted. Aortic atherosclerosis is seen. Right-sided PICC line tip terminates the distal SVC. Small IV catheter at the base the neck on the left. IMPRESSION: Cardiomegaly with CHF and bilateral layering pleural effusions. Aortic atherosclerosis. PICC line tip in the distal SVC. Electronically Signed   By: Ashley Royalty M.D.   On: 09/08/2016 03:59   US Thoracentesis Asp Pleural Space W/img Guide  Result Date: 09/08/2016 INDICATION: Shortness of breath and left pleural effusion. EXAM: ULTRASOUND GUIDED LEFT THORACENTESIS MEDICATIONS: None. COMPLICATIONS: None immediate. PROCEDURE: An ultrasound guided thoracentesis was thoroughly discussed with the patient and family and questions answered. The benefits, risks, alternatives and complications were also discussed. The patient understands and wishes to proceed with the procedure. Written consent was obtained. Ultrasound was performed to localize and mark an adequate pocket of fluid in the left posterior chest. The area was then prepped and draped in the normal sterile fashion. 1% Lidocaine was used for local anesthesia. Under ultrasound guidance a 6 Fr Safe-T-Centesis catheter was introduced. Thoracentesis was performed. The catheter was removed and a dressing applied. FINDINGS: A total of approximately 700 mL of yellow fluid was removed. Samples were sent to the laboratory as requested by the clinical team. IMPRESSION: Successful ultrasound guided left thoracentesis yielding 700 mL of pleural fluid. Electronically Signed   By: Markus Daft M.D.   On: 09/08/2016 07:43    Anti-infectives: Anti-infectives    Start     Dose/Rate Route Frequency Ordered Stop   09/09/16 1600  vancomycin (VANCOCIN) 1,250 mg in sodium  chloride 0.9 % 250 mL IVPB     1,250 mg 166.7 mL/hr over 90 Minutes Intravenous Every 18 hours 09/09/16 0912     09/09/16 1000  vancomycin (VANCOCIN) IVPB 1000 mg/200 mL premix     1,000 mg 200 mL/hr over 60 Minutes Intravenous  Once 09/09/16 0912 09/09/16 1215   09/08/16 0600  vancomycin (VANCOCIN) IVPB 750 mg/150 ml premix  Status:  Discontinued     750 mg 150 mL/hr over 60 Minutes Intravenous Every 12 hours 09/07/16 2123 09/08/16 0922   09/07/16 2300  vancomycin (VANCOCIN) IVPB 1000 mg/200 mL premix     1,000 mg 200 mL/hr over 60 Minutes Intravenous  Once 09/07/16 2123 09/08/16 0017   08/29/2016 0900  piperacillin-tazobactam (ZOSYN) IVPB 3.375 g  Status:  Discontinued     3.375 g 12.5 mL/hr over 240 Minutes Intravenous Every 8 hours 08/27/2016 0337 09/10/2016 0434   08/25/2016 0900  piperacillin-tazobactam (ZOSYN) IVPB 3.375 g     3.375 g 12.5 mL/hr over 240 Minutes Intravenous Every 8 hours 09/07/2016 0434        Assessment/Plan:  1 - Left Ureteral Injury- remains stable from GU perspective. Would keep all current tubes / drains until ileus completely resolved and tolerating PO well and out of ICU, then consider sequential removal. We will continue to follow.  Horald Pollen [facility] 09/09/2016

## 2016-09-09 NOTE — Progress Notes (Signed)
Chaplain rounded the waiting area to provide a compassionate presence and support to the family. [name] [phone]

## 2016-09-09 NOTE — Progress Notes (Signed)
[place] [facility] [place] NAME: Katherine Medina    MR#:  [id number]  DATE OF BIRTH:  [birth year]  SUBJECTIVE:  CHIEF COMPLAINT:   Chief Complaint  Patient presents with  . Post-op Problem    Transfer from [facility]  feeling better. On [place] O2, not needing BiPAP REVIEW OF SYSTEMS:  Review of Systems  Constitutional: Negative for chills, fever and weight loss.  HENT: Negative for nosebleeds and sore throat.   Eyes: Negative for blurred vision.  Respiratory: Negative for cough, shortness of breath and wheezing.   Cardiovascular: Negative for chest pain, orthopnea, leg swelling and PND.  Gastrointestinal: Positive for abdominal pain. Negative for constipation, diarrhea, heartburn, nausea and vomiting.  Genitourinary: Negative for dysuria and urgency.  Musculoskeletal: Negative for back pain.  Skin: Negative for rash.  Neurological: Negative for dizziness, speech change, focal weakness and headaches.  Endo/Heme/Allergies: Does not bruise/bleed easily.  Psychiatric/Behavioral: Negative for depression.   DRUG ALLERGIES:  No Known Allergies VITALS:  Blood pressure (!) 114/52, pulse (!) 107, temperature 100 F (37.8 C), temperature source Oral, resp. rate (!) 32, height 5\' 4"  (1.626 m), weight 79.7 kg (175 lb 11.3 oz), SpO2 96 %. PHYSICAL EXAMINATION:  Physical Exam  Constitutional: She is oriented to person, place, and time. She appears malnourished. She appears unhealthy. She appears cachectic. She appears toxic. She has a sickly appearance.  HENT:  Head: Normocephalic and atraumatic.  Eyes: Conjunctivae and EOM are normal. Pupils are equal, round, and reactive to light.  Neck: Normal range of motion. Neck supple. No tracheal deviation present. No thyromegaly present.  Cardiovascular: Normal rate, regular rhythm and normal heart sounds.   Pulmonary/Chest: Tachypnea noted. She has decreased breath sounds. She has no wheezes. She exhibits no tenderness.   Abdominal: Soft. Bowel sounds are normal. She exhibits no distension. There is generalized tenderness.  right abdominal jp drain intact draining serosanguinous fluid   Musculoskeletal: Normal range of motion.  Neurological: She is alert and oriented to person, place, and time. No cranial nerve deficit.  Skin: Skin is warm and dry. No rash noted.  Psychiatric: Mood and affect normal.   LABORATORY PANEL:   CBC  Recent Labs Lab 09/09/16 0350  WBC 18.5*  HGB 7.2*  HCT 21.7*  PLT 439   ------------------------------------------------------------------------------------------------------------------ Chemistries   Recent Labs Lab 09/08/16 0311 09/09/16 0350  NA 138 138  K 3.3* 3.9  CL 105 103  CO2 29 30  GLUCOSE 122* 127*  BUN 17 16  CREATININE 0.55 0.60  CALCIUM 7.2* 7.1*  MG 1.7  --   AST 27  --   ALT 13*  --   ALKPHOS 91  --   BILITOT 0.4  --    RADIOLOGY:  Dg Chest Port 1 View  Result Date: 09/09/2016 CLINICAL DATA:  82 year old female with abdominal surgery earlier this month. Respiratory failure. Initial encounter. EXAM: PORTABLE CHEST 1 VIEW COMPARISON:  09/08/2016 and earlier. FINDINGS: Portable AP upright view at 0542 hours. Stable right PICC line. Continued bilateral pleural effusions with associated lower lobe collapse or consolidation. Mediastinal contours are stable. Calcified aortic atherosclerosis. Visualized tracheal air column is within normal limits. No pneumothorax. Visible bowel gas pattern is within normal limits. IMPRESSION: Bilateral pleural effusion with lower lobe collapse or consolidation. Ventilation not significantly changed allowing for differences in patient positioning. Calcified aortic atherosclerosis. Electronically Signed   By: Genevie Ann M.D.   On: 09/09/2016 07:29   ASSESSMENT AND PLAN:   *  Acute hypoxic respiratory failure secondary to bilateral pleural effusions and atelectasis s/p hemicolectomy and hysterectomy with injuries to urinary  tract - continue O2 via n.c. - Pulmo following - CXR this am showing Bilateral pleural effusion with lower lobe collapse or Consolidation  - continue Zosyn  * Hypokalemia - repleted and resolved  * Acute on chronic anemia - Hb 7.9 -> 7.2 - consider 1 PRBC transfusion if ok with primary team  * Surgery for hemicolectomy with injuries to urinary tract and urologic surgery.   Continue management as per primary team.   continue Zosyn.  * Severe protein-calorie malnutrition   Albumin is 1.3, patient is on TPN.   Continue monitoring.  * Leukocytosis: 10 -> 18.5 - monitor while on Abx   All the records are reviewed and case discussed with Care Management/Social Worker. Management plans discussed with the patient, family and they are in agreement.  CODE STATUS: FULL CODE  TOTAL TIME TAKING CARE OF THIS PATIENT: 35 minutes.   More than 50% of the time was spent in counseling/coordination of care: YES   Max Sane M.D on 09/09/2016 at 8:34 AM  Between 7am to 6pm - Pager - [phone]  After 6pm go to [email or web address] - Proofreader  [facility]  Office  [phone]  CC: Primary care physician; Madelyn Brunner, MD  Note: This dictation was prepared with Dragon dictation along with smaller phrase technology. Any transcriptional errors that result from this process are unintentional.

## 2016-09-09 NOTE — Progress Notes (Signed)
Pharmacy Antibiotic Note  Katherine Medina is a 82 y.o. female admitted on 09/10/2016 with pelvic abscess.  Pharmacy has been consulted for vancomycin dosing for possible HCAP.  Plan: Vancomycin 1g IV x 1, followed by vancomycin 1250mg  IV Q18hr for goal trough of 15-20. Will obtain trough prior to 5th dose of vancomycin.    Height: 5\' 4"  (162.6 cm) Weight: 175 lb 11.3 oz (79.7 kg) IBW/kg (Calculated) : 54.7  Temp (24hrs), Avg:99.2 F (37.3 C), Min:97.9 F (36.6 C), Max:100 F (37.8 C)   Recent Labs Lab 09/05/16 0618 09/06/16 0532 09/07/16 0648 09/07/16 1346 09/08/16 0311 09/09/16 0350  WBC 18.6* 17.7* SPECIMEN CONTAMINATED, UNABLE TO PERFORM TEST(S). 20.1* 19.0* 18.5*  CREATININE 0.35* 0.42* 0.37*  --  0.55 0.60    Estimated Creatinine Clearance: 52.5 mL/min (by C-G formula based on SCr of 0.6 mg/dL).    No Known Allergies  Antimicrobials this admission: Zosyn 12/18 >>  Vancomycin 12/27 >>   Dose adjustments this admission: N/A  Microbiology results: 12/27 BCx: pending  12/26 Sputum: to young to read 12/18 MRSA PCR: negative  Pharmacy will continue to monitor and adjust per consult.   Simpson,Michael L 09/09/2016 3:40 PM

## 2016-09-09 NOTE — Progress Notes (Signed)
[facility] [facility] CONSULT NOTE   Pharmacy Consult for TPN  Indication: Prolonged ileus    Patient Measurements: Height: 5\' 4"  (162.6 cm) Weight: 175 lb 11.3 oz (79.7 kg) IBW/kg (Calculated) : 54.7 TPN AdjBW (KG): 59.7 Body mass index is 30.16 kg/m.   Assessment: Pharmacy consulted to assist in the management of electrolytes and glucose in this  82 year old woman receiving TPN for postop ileus.  GI: NPO Insulin requirements in the past 24 hours: 9 units    Best Practices: TPN Access: TPN start date: 12/21  Nutritional Goals (per RD recommendation on 2223 calories ):  Current Nutrition: NPO, TPN  Plan:  Continue Clinimix E 5/20 @ 71mL/hr with added MVI, trace and Thiamine 100mg  per bag. Will continue Q4h SSI. Will continue 20% lipid emulsion to infuse over 12 hours daily.   Pharmacy will continue to monitor and adjust per consult.   Simpson,Michael L 09/09/2016,3:37 PM

## 2016-09-09 NOTE — Progress Notes (Signed)
Dr. Manuella Ghazi updated on patient status. Patient currently tolerating a soft diet as pt has orders to advance diet as tolerated. MD made aware of increase in HR to 140's with cough nonsustained. 1514ml's noted as a result of IV Lasix Administration this AM. Patient states that PO Tylenol has greatly helped with her pain. MD updated. Patient observed eating dinner at this time, daughter and spouse at bedside. Will continue to monitor and follow plan of care.

## 2016-09-09 NOTE — Progress Notes (Addendum)
Report given to Tess, RN and care transferred. New bag of TPN just arrived to the floor- night RN states that she will hang new TPN as report has been given. Still awaiting arrival of fats- Message sent to pharmacy in [facility] for missing dose.   TPN hung by this RN prior to departure, night RN to hang Fat emulsions as they have not arrived to unit.  Patient complained of being hot. Thermostat lowered, temp checked 98.4- patient states that she is cooling off

## 2016-09-10 ENCOUNTER — Encounter: Payer: Medicare Other | Admitting: Surgery

## 2016-09-10 DIAGNOSIS — J90 Pleural effusion, not elsewhere classified: Secondary | ICD-10-CM

## 2016-09-10 LAB — CBC
HCT: 23.2 % — ABNORMAL LOW (ref 35.0–47.0)
Hemoglobin: 7.6 g/dL — ABNORMAL LOW (ref 12.0–16.0)
MCH: 27.1 pg (ref 26.0–34.0)
MCHC: 32.8 g/dL (ref 32.0–36.0)
MCV: 82.7 fL (ref 80.0–100.0)
PLATELETS: 478 10*3/uL — AB (ref 150–440)
RBC: 2.8 MIL/uL — ABNORMAL LOW (ref 3.80–5.20)
RDW: 14.3 % (ref 11.5–14.5)
WBC: 19 10*3/uL — ABNORMAL HIGH (ref 3.6–11.0)

## 2016-09-10 LAB — GLUCOSE, CAPILLARY
GLUCOSE-CAPILLARY: 141 mg/dL — AB (ref 65–99)
GLUCOSE-CAPILLARY: 139 mg/dL — AB (ref 65–99)
GLUCOSE-CAPILLARY: 184 mg/dL — AB (ref 65–99)
GLUCOSE-CAPILLARY: 159 mg/dL — AB (ref 65–99)
Glucose-Capillary: 145 mg/dL — ABNORMAL HIGH (ref 65–99)
Glucose-Capillary: 146 mg/dL — ABNORMAL HIGH (ref 65–99)

## 2016-09-10 LAB — COMPREHENSIVE METABOLIC PANEL
ALK PHOS: 92 U/L (ref 38–126)
ALT: 17 U/L (ref 14–54)
AST: 30 U/L (ref 15–41)
Albumin: 1.5 g/dL — ABNORMAL LOW (ref 3.5–5.0)
Anion gap: 3 — ABNORMAL LOW (ref 5–15)
BUN: 17 mg/dL (ref 6–20)
CHLORIDE: 102 mmol/L (ref 101–111)
CO2: 33 mmol/L — AB (ref 22–32)
CREATININE: 0.54 mg/dL (ref 0.44–1.00)
Calcium: 7.4 mg/dL — ABNORMAL LOW (ref 8.9–10.3)
GFR calc Af Amer: >60 mL/min (ref >60)
Glucose, Bld: 139 mg/dL — ABNORMAL HIGH (ref 65–99)
Potassium: 3.6 mmol/L (ref 3.5–5.1)
Sodium: 138 mmol/L (ref 135–145)
Total Bilirubin: 0.2 mg/dL — ABNORMAL LOW (ref 0.3–1.2)
Total Protein: 5.5 g/dL — ABNORMAL LOW (ref 6.5–8.1)

## 2016-09-10 LAB — CULTURE, RESPIRATORY

## 2016-09-10 LAB — MAGNESIUM: Magnesium: 1.9 mg/dL (ref 1.7–2.4)

## 2016-09-10 LAB — PHOSPHORUS: Phosphorus: 3.4 mg/dL (ref 2.5–4.6)

## 2016-09-10 LAB — CULTURE, RESPIRATORY W GRAM STAIN

## 2016-09-10 LAB — TRIGLYCERIDES: TRIGLYCERIDES: 126 mg/dL (ref <150)

## 2016-09-10 MED ORDER — FAT EMULSION 20 % IV EMUL
250.0000 mL | INTRAVENOUS | Status: AC
  Administered 2016-09-10: 250 mL via INTRAVENOUS
  Filled 2016-09-10: qty 250

## 2016-09-10 MED ORDER — TRACE MINERALS CR-CU-MN-SE-ZN 10-1000-500-60 MCG/ML IV SOLN
INTRAVENOUS | Status: AC
  Administered 2016-09-10: 19:00:00 via INTRAVENOUS
  Filled 2016-09-10: qty 1680

## 2016-09-10 MED ORDER — ENSURE ENLIVE PO LIQD
237.0000 mL | Freq: Two times a day (BID) | ORAL | Status: DC
  Administered 2016-09-11 – 2016-09-12 (×2): 237 mL via ORAL

## 2016-09-10 NOTE — Progress Notes (Signed)
Pharmacy Antibiotic Note  Katherine Medina is a 82 y.o. female admitted on 09/05/2016 with pelvic abscess.  Pharmacy has been consulted for vancomycin dosing for possible HCAP. Patient currenrtly   Plan: Continue vancomycin 1250mg  IV Q18hr for goal trough of 15-20. Will obtain trough prior to 5th dose of vancomycin.    Height: 5\' 4"  (162.6 cm) Weight: 175 lb 4.3 oz (79.5 kg) IBW/kg (Calculated) : 54.7  Temp (24hrs), Avg:98.5 F (36.9 C), Min:98.3 F (36.8 C), Max:98.8 F (37.1 C)   Recent Labs Lab 09/06/16 0532 09/07/16 0648 09/07/16 1346 09/08/16 0311 09/09/16 0350 09/10/16 0424  WBC 17.7* SPECIMEN CONTAMINATED, UNABLE TO PERFORM TEST(S). 20.1* 19.0* 18.5* 19.0*  CREATININE 0.42* 0.37*  --  0.55 0.60 0.54    Estimated Creatinine Clearance: 52.4 mL/min (by C-G formula based on SCr of 0.54 mg/dL).    No Known Allergies  Antimicrobials this admission: Zosyn 12/18 >>  Vancomycin 12/27 >>   Dose adjustments this admission: N/A  Microbiology results: 12/27 BCx: pending  12/26 Sputum: moderate yeast  12/18 MRSA PCR: negative  Pharmacy will continue to monitor and adjust per consult.   Simpson,Michael L 09/10/2016 12:14 PM

## 2016-09-10 NOTE — Progress Notes (Signed)
Physical Therapy Treatment Patient Details Name: Katherine Medina MRN: [id number] DOB: [birth year] Today's Date: 09/10/2016    History of Present Illness Pt admitted for complaints of abdominal pain and N/V. Noted pelvic fluid collection in abdomen. Pt with recent hospital stay due to hysterectomy and hemicolectomy on 12/7. Pt now with L ureteral injury, drained fluid, and is now s/p procedure to reimplant transected ureter into bladder with stent placement. On 12/25 pt transferred to the CCU due to acute hypoxic respiratory failure due to Bil pleural effusions requiring BiPAP and L thoracentesis performed on 12/25.      PT Comments    Pt presents with deficits in strength, transfers, mobility, gait, balance, and activity tolerance.  Per nursing session limited to bed therex and sitting at EOB secondary to pt's baseline HR 118 bpm.  Pt required +2 max A for sup to/from sit and was able to sit at EOB 3-4 min with only occasional min A for stability especially upon initially getting into sitting position.  Baseline vital signs: SpO2 88-90%, HR 118 bpm.  During supine therex: SpO2 87-93%, HR 118-122 bpm.  During sitting at EOB: SpO2 83% momentarily during coughing spell but mostly 86-90%.  Pt will benefit from PT services to address above deficits for decreased caregiver assistance upon discharge.    Follow Up Recommendations  SNF;LTACH     Equipment Recommendations  Other (comment) (TBD at next venue of care)    Recommendations for Other Services       Precautions / Restrictions Precautions Precautions: Fall Precaution Comments: Monitor HR, O2. PICC line RUE Restrictions Weight Bearing Restrictions: No    Mobility  Bed Mobility Overal bed mobility: Needs Assistance Bed Mobility: Supine to Sit;Sit to Supine     Supine to sit: +2 for physical assistance;Max assist Sit to supine: +2 for physical assistance;Max assist      Transfers                 General transfer comment:  Did not attempt per nursing secondary to elevated HR at rest  Ambulation/Gait             General Gait Details: Did not attempt per nursing secondary to elevated HR at rest   Stairs            Wheelchair Mobility    Modified Rankin (Stroke Patients Only)       Balance Overall balance assessment: Needs assistance Sitting-balance support: Bilateral upper extremity supported Sitting balance-Leahy Scale: Fair Sitting balance - Comments: Occasional min A for stability during sitting at EOB but pt able to maintain position independently during most of the 3-4 min she was able to tolerate sitting.                            Cognition Arousal/Alertness: Awake/alert Behavior During Therapy: Flat affect Overall Cognitive Status: Within Functional Limits for tasks assessed                      Exercises Total Joint Exercises Ankle Circles/Pumps: Strengthening;Both;10 reps;15 reps Quad Sets: AROM;Both;10 reps Short Arc Quad: Strengthening;Both;10 reps;15 reps Heel Slides: AAROM;Both;10 reps;15 reps Hip ABduction/ADduction: AAROM;Both;10 reps;15 reps Straight Leg Raises: AAROM;Both;10 reps;15 reps Long Arc Quad: AROM;Both;10 reps Other Exercises Other Exercises: Gentle low amplitude bridges 2 x 10 Other Exercises: Pursed lip breathing education provided throughout session with frequent therapeutic rest breaks taken. Other Exercises: Sitting balance/core strengthening with sitting unsupported at EOB  3-4 min.    General Comments General comments (skin integrity, edema, etc.): Spoke to RN prior to session regarding resting HR of 118 bpm and SpO2 88-90% on 3LO2/min.  Nurse requested bed exercise and sitting at EOB only this session.       Pertinent Vitals/Pain Pain Assessment: No/denies pain    Home Living                      Prior Function            PT Goals (current goals can now be found in the care plan section) Progress towards PT  goals: Progressing toward goals    Frequency    Min 2X/week      PT Plan Current plan remains appropriate    Co-evaluation             End of Session Equipment Utilized During Treatment: Oxygen Activity Tolerance: Patient limited by fatigue Patient left: in bed;with call bell/phone within reach;with bed alarm set;with nursing/sitter in room;with family/visitor present     Time: [id number] PT Time Calculation (min) (ACUTE ONLY): 39 min  Charges:  $Therapeutic Exercise: 23-37 mins $Therapeutic Activity: 8-22 mins                    G Codes:      DRoyetta Asal PT, DPT 09/10/16, 3:59 PM

## 2016-09-10 NOTE — Progress Notes (Signed)
Nutrition Follow-up  DOCUMENTATION CODES:   Not applicable  INTERVENTION:  -Pt would like to try a variety of nutritional supplements; reviewed options for nutritional supplementation and pt would like to try Ensure, [employer] and [employer]. Corporate treasurer Cup at [employer] and [employer]; [employer] at [employer] and [employer], Ensure at 10am and 8pm snack. Continue to assess -Recommend continuing TPN (if tolerating) until pt demonstrating tolerance of solid food diet and meeting at least 50% of nutritional needs   NUTRITION DIAGNOSIS:   Inadequate oral intake related to inability to eat as evidenced by NPO status.  Being addressed via TPN, supplements, diet advancement  GOAL:   Patient will meet greater than or equal to 90% of their needs  MONITOR:   Diet advancement, Labs, Weight trends, I & O's  REASON FOR ASSESSMENT:   Consult New TPN/TNA  ASSESSMENT:   82 year old female who is status post laparoscopic hand-assisted right hemicolectomy and hysterectomy who was postop day #11 on admit. She presented to the [facility] emergency department with acute onset abdominal pain. Now s/p open LEFT ureteral reimplant with bilat retrograde, and left JJ stent 12/18 by Louis Meckel as primary repair of delayed left ureteral injury after hemicolectomy. KUB 12/23 with JJ stent and surgical drain in good position. Pt also with prolonged ileus.  5%AA/20%Dextrose infusing at rate of 70 ml/hr, 20% Lipids daily at 20 ml/hr for 12 hours via PICC line  Diet advanced to Soft yesterday afternoon; pt ate some chicken, mashed potatoes and cooked carrots for dinner last night and tolerated well. Pt did eat some breakfast this AM, scrambled eggs, but per pt, she began coughing and "gagged it all up." Pt feels terrible. Pt with "nausea green bag" at bedside but pt reports no nausea prior to vomiting or at present time. Pt reports she does not like the hospital food. Pt has menu in  room; family not aware of the room service process and that they may call down to diet office to place meal orders. Writer reviewed this process with them  Labs: reviewed Meds: reviewed  Diet Order:  .TPN (CLINIMIX-E) Adult DIET SOFT Room service appropriate? Yes; Fluid consistency: Thin  Skin:  Wound (see comment) (Abdominal surgical wounds)  Last BM:  12/27  Height:   Ht Readings from Last 1 Encounters:  09/05/2016 5\' 4"  (1.626 m)    Filed Weights   09/08/16 0434 09/09/16 0415 09/10/16 0600  Weight: 180 lb 8.9 oz (81.9 kg) 175 lb 11.3 oz (79.7 kg) 175 lb 4.3 oz (79.5 kg)    Ideal Body Weight:  54.54 kg  BMI:  Body mass index is 30.08 kg/m.  Estimated Nutritional Needs:   Kcal:  [id number] kcals  Protein:  82-123 g  Fluid:  >/= 1.8 L  EDUCATION NEEDS:   No education needs identified at this time  [place], [place], [place] [phone] Pager  [phone] Weekend/On-Call Pager

## 2016-09-10 NOTE — Progress Notes (Signed)
[place] [facility] [place] NAME: Katherine Medina    MR#:  [id number]  DATE OF BIRTH:  1929/06/05  SUBJECTIVE:  CHIEF COMPLAINT:   Chief Complaint  Patient presents with  . Post-op Problem    Transfer from [place] members at bedside, requesting to keep her in ICU longer, also requesting to get more often Physical therapy on regular basis, she is tachycardic.  Vomited once earlier today REVIEW OF SYSTEMS:  Review of Systems  Constitutional: Positive for malaise/fatigue. Negative for chills, fever and weight loss.  HENT: Negative for nosebleeds and sore throat.   Eyes: Negative for blurred vision.  Respiratory: Negative for cough, shortness of breath and wheezing.   Cardiovascular: Negative for chest pain, orthopnea, leg swelling and PND.  Gastrointestinal: Positive for abdominal pain. Negative for constipation, diarrhea, heartburn, nausea and vomiting.  Genitourinary: Negative for dysuria and urgency.  Musculoskeletal: Negative for back pain.  Skin: Negative for rash.  Neurological: Positive for weakness. Negative for dizziness, speech change, focal weakness and headaches.  Endo/Heme/Allergies: Does not bruise/bleed easily.  Psychiatric/Behavioral: Negative for depression.   DRUG ALLERGIES:  No Known Allergies VITALS:  Blood pressure (!) 126/48, pulse (!) 118, temperature 99 F (37.2 C), temperature source Oral, resp. rate (!) 23, height 5\' 4"  (1.626 m), weight 79.5 kg (175 lb 4.3 oz), SpO2 90 %. PHYSICAL EXAMINATION:  Physical Exam  Constitutional: She is oriented to person, place, and time. She appears malnourished. She appears unhealthy. She appears cachectic. She has a sickly appearance.  HENT:  Head: Normocephalic and atraumatic.  Eyes: Conjunctivae and EOM are normal. Pupils are equal, round, and reactive to light.  Neck: Normal range of motion. Neck supple. No tracheal deviation present. No thyromegaly present.  Cardiovascular:  Normal rate, regular rhythm and normal heart sounds.   Pulmonary/Chest: She has decreased breath sounds. She has no wheezes. She exhibits no tenderness.  Abdominal: Soft. Bowel sounds are normal. She exhibits no distension. There is generalized tenderness.  right abdominal jp drain intact draining serosanguinous fluid   Musculoskeletal: Normal range of motion.  Neurological: She is alert and oriented to person, place, and time. No cranial nerve deficit.  Skin: Skin is warm and dry. No rash noted.  Psychiatric: Mood and affect normal.   LABORATORY PANEL:   CBC  Recent Labs Lab 09/10/16 0424  WBC 19.0*  HGB 7.6*  HCT 23.2*  PLT 478*   ------------------------------------------------------------------------------------------------------------------ Chemistries   Recent Labs Lab 09/10/16 0424  NA 138  K 3.6  CL 102  CO2 33*  GLUCOSE 139*  BUN 17  CREATININE 0.54  CALCIUM 7.4*  MG 1.9  AST 30  ALT 17  ALKPHOS 92  BILITOT 0.2*   RADIOLOGY:  No results found. ASSESSMENT AND PLAN:   * Acute hypoxic respiratory failure secondary to bilateral pleural effusions and atelectasis s/p hemicolectomy and hysterectomy with injuries to urinary tract - continue O2 via n.c. - Pulmo following - Repeat chest x-ray in the morning - continue Vancomycin and Zosyn  * Hypokalemia - repleted and resolved  * Acute on chronic anemia - Hb 7.9 -> 7.2->7.6  * Surgery for hemicolectomy with injuries to urinary tract and urologic surgery.   Continue management as per primary team.   continue Zosyn.Vancomycin, added  * Severe protein-calorie malnutrition   Albumin is 1.3, patient is on TPN.   Continue monitoring.  * Leukocytosis: 10 -> 18.5->19 - monitor while on Abx - Consider Infectious disease consultation  Aggressive physical therapy  She is looking sick.  If no much improvement, consider palliative care consultation   All the records are reviewed and case discussed with  Care Management/Social Worker. Management plans discussed with the patient, family and they are in agreement.  CODE STATUS: FULL CODE  TOTAL TIME (critical care) TAKING CARE OF THIS PATIENT: 25 minutes.   More than 50% of the time was spent in counseling/coordination of care: YES   Max Sane M.D on 09/10/2016 at 6:31 PM  Between 7am to 6pm - Pager - [phone]  After 6pm go to [email or web address] - Proofreader  [facility]  Office  [phone]  CC: Primary care physician; Madelyn Brunner, MD  Note: This dictation was prepared with Dragon dictation along with smaller phrase technology. Any transcriptional errors that result from this process are unintentional.

## 2016-09-10 NOTE — Progress Notes (Signed)
82 year old status post left ureteral re- implantation after transection from hysterectomy. Patient able to respond and joking saw him while with her treatments although still short of breath just talking. She states that she does not like the food here and does not feel very hungry but denies any nausea or any abdominal pain at this time.  Vitals:   09/10/16 1500 09/10/16 1538  BP: (!) 126/48   Pulse: (!) 117 (!) 118  Resp: (!) 23   Temp:     I/O last 3 completed shifts: In: 2130.5 [I.V.:1900.5; Other:130; IV Piggyback:100] Out: 4516 [Urine:4500; Drains:16] Total I/O In: 1141.2 [I.V.:491.2; IV Piggyback:650] Out: 500 [Urine:500]   PE:  Gen: NAD Res: short of breath with talking, some slight desaturation to 80s while on treatment, crackles in bilateral bases Cardio: Tachycardic, no murmur Abd: soft, incisions c/d/i, JPdrain serous drainage, moderately distended, appropriately tender GU: foley in place, draininage yellow urine Ext: 1+edema   CBC Latest Ref Rng & Units 09/10/2016 09/09/2016 09/08/2016  WBC 3.6 - 11.0 K/uL 19.0(H) 18.5(H) 19.0(H)  Hemoglobin 12.0 - 16.0 g/dL 7.6(L) 7.2(L) 7.9(L)  Hematocrit 35.0 - 47.0 % 23.2(L) 21.7(L) 23.7(L)  Platelets 150 - 440 K/uL 478(H) 439 430   CMP Latest Ref Rng & Units 09/10/2016 09/09/2016 09/08/2016  Glucose 65 - 99 mg/dL 139(H) 127(H) 122(H)  BUN 6 - 20 mg/dL 17 16 17   Creatinine 0.44 - 1.00 mg/dL 0.54 0.60 0.55  Sodium 135 - 145 mmol/L 138 138 138  Potassium 3.5 - 5.1 mmol/L 3.6 3.9 3.3(L)  Chloride 101 - 111 mmol/L 102 103 105  CO2 22 - 32 mmol/L 33(H) 30 29  Calcium 8.9 - 10.3 mg/dL 7.4(L) 7.1(L) 7.2(L)  Total Protein 6.5 - 8.1 g/dL 5.5(L) - 4.8(L)  Total Bilirubin 0.3 - 1.2 mg/dL 0.2(L) - 0.4  Alkaline Phos 38 - 126 U/L 92 - 91  AST 15 - 41 U/L 30 - 27  ALT 14 - 54 U/L 17 - 13(L)     A/P:  82 year old status post left ureteral re- implantation after transection  Pain:  Prn dilaudid iv  Res: concern for pneumonia,  continue abx vanc/ zosyn and pulm toilet and nebulizer treatments, keep in stepdown with Desaturations Cardio: tachycardia, continue metoprolol  GI: continue soft diet, likely still some ileus with distension but having stools and passing flatus, continue TPN until taking more po, continue PPI GU: continue JP drain minimal serous drainage and Foley as per urology  Hem/ID:  Leukocytosis, continue Vanc and Zosyn, should cover for pneumonia and any GI source, if continues to increase would consider CT scan of the abd/pelvis Resetart PT as respiratory status allows, currently SOB with talking with some desaturations.

## 2016-09-10 NOTE — Progress Notes (Addendum)
Very weak. No distress. Cognition intact. Anorexia persists. Vomited after trying to eat breakfast. Rattling cough. Family very concerned about lack of improvement.  Vitals:   09/10/16 1300 09/10/16 1400 09/10/16 1500 09/10/16 1538  BP: (!) 133/54 (!) 135/49 (!) 126/48   Pulse: (!) 114 (!) 118 (!) 117 (!) 118  Resp: (!) 27 (!) 26 (!) 23   Temp:  99 F (37.2 C)    TempSrc:  Oral    SpO2: (!) 88% 90% 92% 90%  Weight:      Height:        HEENT WNL No JVD Dull with diminished BS qpprox 1/3 up on R, crackles in L base Tachy, reg, no M Abd slightly firm, mildly tender, absent BS Ext warm, symmetric edema  BMP Latest Ref Rng & Units 09/10/2016 09/09/2016 09/08/2016  Glucose 65 - 99 mg/dL 139(H) 127(H) 122(H)  BUN 6 - 20 mg/dL 17 16 17   Creatinine 0.44 - 1.00 mg/dL 0.54 0.60 0.55  Sodium 135 - 145 mmol/L 138 138 138  Potassium 3.5 - 5.1 mmol/L 3.6 3.9 3.3(L)  Chloride 101 - 111 mmol/L 102 103 105  CO2 22 - 32 mmol/L 33(H) 30 29  Calcium 8.9 - 10.3 mg/dL 7.4(L) 7.1(L) 7.2(L)   CBC Latest Ref Rng & Units 09/10/2016 09/09/2016 09/08/2016  WBC 3.6 - 11.0 K/uL 19.0(H) 18.5(H) 19.0(H)  Hemoglobin 12.0 - 16.0 g/dL 7.6(L) 7.2(L) 7.9(L)  Hematocrit 35.0 - 47.0 % 23.2(L) 21.7(L) 23.7(L)  Platelets 150 - 440 K/uL 478(H) 439 430   CXR: NNF  IMPRESSION: 1) Acute hypoxic respiratory failure - multifactorial 2) Bilateral pleural effusions - S/P L thoracentesis 12/25 - fluid c/w transudate 3) Poor cough mechanics with mucus retention and atelectasis 4) Possible HAP 5) s/p hemicolectomy and hysterectomy with injuries to urinary tract  PLAN/REC: 1) Continue supplemental O2 2) Continue airway hygiene including nebulized BDs 3) Empiric antibiotics per Surgery  4) Mobilize. PT ordered 5) Daughters updated in detail @ bedside. They expressed frustration that pt is not improving.  6) Recheck CXR 12/29   Merton Border, MD PCCM service Mobile [phone] Pager  [phone] 09/10/2016

## 2016-09-10 NOTE — Progress Notes (Signed)
  Visit with patient this evening. States she is feeling much better than when she was transferred to the ICU.  Abdomen is currently soft and nontender and patient is breathing with ease.  Patient has improved dramatically. Likely able be transferred out of the ICU in the morning.  Clayburn Pert, MD [facility]

## 2016-09-10 NOTE — Progress Notes (Signed)
[place] CONSULT NOTE   Pharmacy Consult for TPN  Indication: Prolonged ileus    Patient Measurements: Height: 5\' 4"  (162.6 cm) Weight: 175 lb 4.3 oz (79.5 kg) IBW/kg (Calculated) : 54.7 TPN AdjBW (KG): 59.7 Body mass index is 30.08 kg/m.   Assessment: Pharmacy consulted to assist in the management of electrolytes and glucose in this  82 year old woman receiving TPN for postop ileus.  Insulin requirements in the past 24 hours: 8 units    Best Practices: TPN Access: TPN start date: 12/21  Nutritional Goals (per RD recommendation on 2223 calories ):  Current Nutrition: NPO, TPN  Plan:  Continue Clinimix E 5/20 @ 70mL/hr with added MVI, trace and Thiamine 100mg  per bag. Will continue Q4h SSI. Will continue 20% lipid emulsion to infuse over 12 hours daily.   Electrolytes remain within normal limits. Unless otherwise clinically indicated, will recheck electrolytes with am labs on 12/30.    Pharmacy will continue to monitor and adjust per consult.   Simpson,Michael L 09/10/2016,12:12 PM

## 2016-09-11 ENCOUNTER — Inpatient Hospital Stay: Payer: Medicare Other

## 2016-09-11 ENCOUNTER — Ambulatory Visit: Payer: Medicare Other

## 2016-09-11 DIAGNOSIS — J90 Pleural effusion, not elsewhere classified: Secondary | ICD-10-CM

## 2016-09-11 DIAGNOSIS — J9600 Acute respiratory failure, unspecified whether with hypoxia or hypercapnia: Secondary | ICD-10-CM

## 2016-09-11 DIAGNOSIS — K651 Peritoneal abscess: Secondary | ICD-10-CM

## 2016-09-11 DIAGNOSIS — K567 Ileus, unspecified: Secondary | ICD-10-CM

## 2016-09-11 DIAGNOSIS — R0603 Acute respiratory distress: Secondary | ICD-10-CM

## 2016-09-11 DIAGNOSIS — R0600 Dyspnea, unspecified: Secondary | ICD-10-CM

## 2016-09-11 DIAGNOSIS — T8143XA Infection following a procedure, organ and space surgical site, initial encounter: Secondary | ICD-10-CM

## 2016-09-11 DIAGNOSIS — T8149XA Infection following a procedure, other surgical site, initial encounter: Secondary | ICD-10-CM

## 2016-09-11 DIAGNOSIS — S3710XA Unspecified injury of ureter, initial encounter: Secondary | ICD-10-CM

## 2016-09-11 DIAGNOSIS — N739 Female pelvic inflammatory disease, unspecified: Secondary | ICD-10-CM

## 2016-09-11 LAB — BASIC METABOLIC PANEL
Anion gap: 4 — ABNORMAL LOW (ref 5–15)
BUN: 19 mg/dL (ref 6–20)
CALCIUM: 7.6 mg/dL — AB (ref 8.9–10.3)
CO2: 33 mmol/L — ABNORMAL HIGH (ref 22–32)
CREATININE: 0.46 mg/dL (ref 0.44–1.00)
Chloride: 101 mmol/L (ref 101–111)
GFR calc Af Amer: >60 mL/min (ref >60)
GFR calc non Af Amer: >60 mL/min (ref >60)
GLUCOSE: 124 mg/dL — AB (ref 65–99)
Potassium: 4 mmol/L (ref 3.5–5.1)
Sodium: 138 mmol/L (ref 135–145)

## 2016-09-11 LAB — GLUCOSE, CAPILLARY
GLUCOSE-CAPILLARY: 110 mg/dL — AB (ref 65–99)
GLUCOSE-CAPILLARY: 124 mg/dL — AB (ref 65–99)
GLUCOSE-CAPILLARY: 130 mg/dL — AB (ref 65–99)
Glucose-Capillary: 128 mg/dL — ABNORMAL HIGH (ref 65–99)
Glucose-Capillary: 178 mg/dL — ABNORMAL HIGH (ref 65–99)
Glucose-Capillary: 148 mg/dL — ABNORMAL HIGH (ref 65–99)

## 2016-09-11 LAB — DIFFERENTIAL
BAND NEUTROPHILS: 13 %
BASOS PCT: 0 %
BLASTS: 0 %
Basophils Absolute: 0 10*3/uL (ref 0–0.1)
Eosinophils Absolute: 0 10*3/uL (ref 0–0.7)
Eosinophils Relative: 0 %
LYMPHS PCT: 4 %
Lymphs Abs: 1.1 10*3/uL (ref 1.0–3.6)
Metamyelocytes Relative: 2 %
Monocytes Absolute: 2.7 10*3/uL — ABNORMAL HIGH (ref 0.2–0.9)
Monocytes Relative: 10 %
Myelocytes: 1 %
NEUTROS ABS: 22.7 10*3/uL — AB (ref 1.4–6.5)
NEUTROS PCT: 70 %
NRBC: 0 /100{WBCs}
OTHER: 0 %
PROMYELOCYTES ABS: 0 %

## 2016-09-11 LAB — CBC
HCT: 22.9 % — ABNORMAL LOW (ref 35.0–47.0)
HEMOGLOBIN: 7.6 g/dL — AB (ref 12.0–16.0)
MCH: 27.6 pg (ref 26.0–34.0)
MCHC: 33.3 g/dL (ref 32.0–36.0)
MCV: 82.9 fL (ref 80.0–100.0)
PLATELETS: 444 10*3/uL — AB (ref 150–440)
RBC: 2.76 MIL/uL — ABNORMAL LOW (ref 3.80–5.20)
RDW: 14.8 % — ABNORMAL HIGH (ref 11.5–14.5)
WBC: 26.5 10*3/uL — ABNORMAL HIGH (ref 3.6–11.0)

## 2016-09-11 LAB — VANCOMYCIN, TROUGH: VANCOMYCIN TR: 12 ug/mL — AB (ref 15–20)

## 2016-09-11 MED ORDER — IOPAMIDOL (ISOVUE-300) INJECTION 61%
100.0000 mL | Freq: Once | INTRAVENOUS | Status: AC | PRN
  Administered 2016-09-11: 100 mL via INTRAVENOUS

## 2016-09-11 MED ORDER — MEROPENEM-SODIUM CHLORIDE 1 GM/50ML IV SOLR
1.0000 g | Freq: Three times a day (TID) | INTRAVENOUS | Status: DC
  Administered 2016-09-11 – 2016-09-13 (×5): 1 g via INTRAVENOUS
  Filled 2016-09-11 (×10): qty 50

## 2016-09-11 MED ORDER — SODIUM CHLORIDE 0.9 % IV BOLUS (SEPSIS)
250.0000 mL | Freq: Once | INTRAVENOUS | Status: AC
  Administered 2016-09-11: 250 mL via INTRAVENOUS

## 2016-09-11 MED ORDER — TRACE MINERALS CR-CU-MN-SE-ZN 10-1000-500-60 MCG/ML IV SOLN
INTRAVENOUS | Status: AC
  Administered 2016-09-11: 19:00:00 via INTRAVENOUS
  Filled 2016-09-11: qty 1680

## 2016-09-11 MED ORDER — DEXTROSE 5 % IV SOLN
30.0000 ug/min | INTRAVENOUS | Status: DC
  Administered 2016-09-11: 30 ug/min via INTRAVENOUS
  Administered 2016-09-12: 60 ug/min via INTRAVENOUS
  Administered 2016-09-12: 20 ug/min via INTRAVENOUS
  Administered 2016-09-12: 50 ug/min via INTRAVENOUS
  Administered 2016-09-12: 70 ug/min via INTRAVENOUS
  Filled 2016-09-11 (×5): qty 1

## 2016-09-11 MED ORDER — SODIUM CHLORIDE 0.9 % IV BOLUS (SEPSIS)
500.0000 mL | Freq: Once | INTRAVENOUS | Status: AC
  Administered 2016-09-11: 500 mL via INTRAVENOUS

## 2016-09-11 MED ORDER — CLOTRIMAZOLE 1 % EX CREA
TOPICAL_CREAM | Freq: Two times a day (BID) | CUTANEOUS | Status: DC
  Administered 2016-09-11: 12:00:00 via TOPICAL
  Administered 2016-09-11: 1 via TOPICAL
  Administered 2016-09-12 – 2016-09-13 (×3): via TOPICAL
  Filled 2016-09-11 (×3): qty 15

## 2016-09-11 MED ORDER — FAT EMULSION 20 % IV EMUL
250.0000 mL | INTRAVENOUS | Status: AC
  Administered 2016-09-11: 250 mL via INTRAVENOUS
  Filled 2016-09-11: qty 250

## 2016-09-11 MED ORDER — IOPAMIDOL (ISOVUE-300) INJECTION 61%
15.0000 mL | INTRAVENOUS | Status: AC
  Administered 2016-09-11 (×2): 15 mL via ORAL

## 2016-09-11 MED ORDER — SODIUM CHLORIDE 0.9 % IV SOLN
1.0000 g | Freq: Three times a day (TID) | INTRAVENOUS | Status: DC
  Filled 2016-09-11 (×2): qty 1

## 2016-09-11 NOTE — Progress Notes (Signed)
MAP 55. Notified NP [place], 250 cc ns bolus ordered.

## 2016-09-11 NOTE — Progress Notes (Signed)
Requested from MD Kasa medication for redness, MSAD and tenderness in groin area and around urethral catheter site. MD Kasa stated to remove Foley catheter and ordered medications. Will continue to monitor patient.

## 2016-09-11 NOTE — Progress Notes (Signed)
Nutrition Follow-up  DOCUMENTATION CODES:   Not applicable  INTERVENTION:  -TPN: recommend continuing 5%AA/20%Dextrose with Electrolytes, MVI and trace minerals added at rate of 70 ml/hr; continue 20% Lpids at rate of 20 ml/hr for 12 hours daily -Encourage po intake as tolerated including meals, food brought in by family and snacks. May benefit from calorie count to better assess nutritional intake -Await results of CT abdomen   NUTRITION DIAGNOSIS:   Inadequate oral intake related to inability to eat as evidenced by NPO status.  GOAL:   Patient will meet greater than or equal to 90% of their needs  MONITOR:   Diet advancement, Labs, Weight trends, I & O's  REASON FOR ASSESSMENT:   Consult New TPN/TNA  ASSESSMENT:   Worsening leukocytosis, afebrile, CT abdomen/pelvis today  TPN continues, 5%AA/20%Dextrose at rate of 70 ml/hr with addition of 20% Lipids at rate of 20 ml/hr for 12 hours daily via PICC line  Appetite and po intake remains poor; pt not eating meal trays but drinking some Ensure. Pt does not like the hospital food. Did not want to try [employer] yesterday. Per Lovena Le RN, pt is eating some food brought in by family but unsure what this is.   Labs: WBC trending up, Hgb 7.6, FSBS <180 but >100 Meds: ss novolog  Diet Order:  DIET SOFT Room service appropriate? Yes; Fluid consistency: Thin .TPN (CLINIMIX-E) Adult  Skin:  Wound (see comment) (Abdominal surgical wounds)  Last BM:  12/27  Height:   Ht Readings from Last 1 Encounters:  09/12/2016 5\' 4"  (1.626 m)    Weight:   Wt Readings from Last 1 Encounters:  09/11/16 179 lb 10.8 oz (81.5 kg)   Filed Weights   09/09/16 0415 09/10/16 0600 09/11/16 0600  Weight: 175 lb 11.3 oz (79.7 kg) 175 lb 4.3 oz (79.5 kg) 179 lb 10.8 oz (81.5 kg)    Ideal Body Weight:  54.54 kg  BMI:  Body mass index is 30.84 kg/m.  Estimated Nutritional Needs:   Kcal:  [id number] kcals  Protein:  82-123 g  Fluid:  >/= 1.8  L  EDUCATION NEEDS:   No education needs identified at this time  [place], [place], [place] [phone] Pager  [phone] Weekend/On-Call Pager

## 2016-09-11 NOTE — Progress Notes (Signed)
Very weak. No distress. Cognition intact. Anorexia persists.  Rattling cough  CT abd shows another abscess s/p drainage by Gen surgery JP drain placed-had nausea and increased WOB with manipulation Vaginal rash and pain-foley removed, but Urology wants it re-inserted Plan for US guided thoracentesis for effusion  Vitals:   09/11/16 1100 09/11/16 1200 09/11/16 1300 09/11/16 1430  BP: (!) 139/52 (!) 147/65 (!) 125/53   Pulse: (!) 115 (!) 115 (!) 104   Resp: 18 (!) 27 20   Temp:      TempSrc:      SpO2: 93% (!) 89% 96% 96%  Weight:      Height:        HEENT WNL No JVD Dull with diminished BS qpprox 1/3 up on R, crackles in L base Tachy, reg, no M Abd slightly firm, mildly tender, absent BS Ext warm, symmetric edema  BMP Latest Ref Rng & Units 09/11/2016 09/10/2016 09/09/2016  Glucose 65 - 99 mg/dL 124(H) 139(H) 127(H)  BUN 6 - 20 mg/dL 19 17 16   Creatinine 0.44 - 1.00 mg/dL 0.46 0.54 0.60  Sodium 135 - 145 mmol/L 138 138 138  Potassium 3.5 - 5.1 mmol/L 4.0 3.6 3.9  Chloride 101 - 111 mmol/L 101 102 103  CO2 22 - 32 mmol/L 33(H) 33(H) 30  Calcium 8.9 - 10.3 mg/dL 7.6(L) 7.4(L) 7.1(L)   CBC Latest Ref Rng & Units 09/11/2016 09/10/2016 09/09/2016  WBC 3.6 - 11.0 K/uL 26.5(H) 19.0(H) 18.5(H)  Hemoglobin 12.0 - 16.0 g/dL 7.6(L) 7.6(L) 7.2(L)  Hematocrit 35.0 - 47.0 % 22.9(L) 23.2(L) 21.7(L)  Platelets 150 - 440 K/uL 444(H) 478(H) 439   CXR: NNF  IMPRESSION: 1) Acute hypoxic respiratory failure - multifactorial 2) Bilateral pleural effusions - S/P L thoracentesis 12/25 - fluid c/w transudate plan for repeat US guided thoracentesis on Rt side 3) Poor cough mechanics with mucus retention and atelectasis 4) Possible HAP 5) s/p hemicolectomy and hysterectomy with injuries to urinary tract -with abd abscess s/p jp drain  PLAN/REC: 1) Continue supplemental O2 2) Continue airway hygiene including nebulized BDs 3) Empiric antibiotics per Surgery  4) Mobilize if able 5)will need  to discuss goals of care with family  Overall poor prognosis.    Corrin Parker, M.D.  [facility]  Medical Director [place] Director [facility] Cardio-Pulmonary Department

## 2016-09-11 NOTE — Progress Notes (Signed)
Physical Therapy Treatment Patient Details Name: Katherine Medina MRN: [id number] DOB: March 20, 1929 Today's Date: 09/11/2016    History of Present Illness Pt admitted for complaints of abdominal pain and N/V. Noted pelvic fluid collection in abdomen. Pt with recent hospital stay due to hysterectomy and hemicolectomy on 12/7. Pt now with L ureteral injury, drained fluid, and is now s/p procedure to reimplant transected ureter into bladder with stent placement. On 12/25 pt transferred to the CCU due to acute hypoxic respiratory failure due to Bil pleural effusions requiring BiPAP and L thoracentesis performed on 12/25.  Pt now complains of L shoulder pain this date.    PT Comments    Pt is making gradual progress towards goals. Pt limited in OOB mobility secondary to poor balance/endurance. Pt also limited by L shoulder pain at this time. Educated pt on positioning to alleviate pain as well as ice applied. Gentle ROM given to L shoulder. Pt able to tolerate sitting at EOB for a few minutes this date, however fatigues quickly. Pt lost flutter valve, is requesting new one. Gave family written HEP to review over weekend.   Follow Up Recommendations  SNF;LTACH     Equipment Recommendations       Recommendations for Other Services       Precautions / Restrictions Precautions Precautions: Fall Restrictions Weight Bearing Restrictions: No    Mobility  Bed Mobility Overal bed mobility: Needs Assistance Bed Mobility: Supine to Sit;Sit to Supine     Supine to sit: Mod assist Sit to supine: Max assist   General bed mobility comments: Assist required for initiating moving B LE off bed. Needs significant assist for trunk mobility, however is able to use R UE for assistance. Once seated at EOB, pt able to sit with mod assist, unable to fully sit indepedently with severe post leaning noted. Only able to sit for approx 2 minutes prior to fatigue.  Transfers                 General transfer  comment: unable secondary to poor balance  Ambulation/Gait                 Stairs            Wheelchair Mobility    Modified Rankin (Stroke Patients Only)       Balance                                    Cognition Arousal/Alertness: Awake/alert Behavior During Therapy: WFL for tasks assessed/performed Overall Cognitive Status: Within Functional Limits for tasks assessed                      Exercises Other Exercises Other Exercises: Reviewed supine written ther-ex with family in room. Performed gentle mobility and distraction to L shoulder for pain relief including rotations. Pt also performed rolling to R side for pillow positioning. Other Exercises: Pt performed pursed lip breathing. All mobility performed on 2 L of O2 with sats decreasing to 87% with exertion.    General Comments        Pertinent Vitals/Pain Pain Assessment: Faces Faces Pain Scale: Hurts whole lot Pain Location: abdomen and L shoulder Pain Descriptors / Indicators: Discomfort;Dull;Aching Pain Intervention(s): Limited activity within patient's tolerance;Repositioned;Ice applied    Home Living  Prior Function            PT Goals (current goals can now be found in the care plan section) Acute Rehab PT Goals Patient Stated Goal: to get stronger PT Goal Formulation: With patient Time For Goal Achievement: 09/22/16 Potential to Achieve Goals: Good Progress towards PT goals: Progressing toward goals    Frequency    Min 2X/week      PT Plan Current plan remains appropriate    Co-evaluation             End of Session Equipment Utilized During Treatment: Oxygen Activity Tolerance: Patient limited by fatigue Patient left: in bed;with call bell/phone within reach;with bed alarm set;with nursing/sitter in room;with family/visitor present     Time: [id number] PT Time Calculation (min) (ACUTE ONLY): 25 min  Charges:   $Therapeutic Exercise: 8-22 mins $Therapeutic Activity: 8-22 mins                    G Codes:      Mei Suits 2016-09-27, 4:26 PM  Greggory Stallion, PT, DPT [phone]

## 2016-09-11 NOTE — Progress Notes (Signed)
11 Days Post-Op  Subjective:  1 - Left Ureteral Injury- s/p open LEFT ureteral reimplant with bilat retrograde, and left JJ stent 12/18 by Louis Meckel as primary repair of delayed left ureteral injury after hemicolectomy. KUB 12/23 with JJ stent and surgical drain in good position.  CT with new possible abscess not near reimplant. JP and left ureteral stent well placed.   Foley discontinued without discussing with urology  Today "Katherine Medina" is feeling better. SOB improving. Minimal appetite.  JP 16 cc/24 hr  Objective: Vital signs in last 24 hours: Temp:  [98.5 F (36.9 C)-99.8 F (37.7 C)] 98.5 F (36.9 C) (12/29 0800) Pulse Rate:  [91-118] 104 (12/29 1300) Resp:  [17-37] 20 (12/29 1300) BP: (63-147)/(31-69) 125/53 (12/29 1300) SpO2:  [89 %-98 %] 96 % (12/29 1300) Weight:  [179 lb 10.8 oz (81.5 kg)] 179 lb 10.8 oz (81.5 kg) (12/29 0600) Last BM Date: 09/09/16  Intake/Output from previous day: 12/28 0701 - 12/29 0700 In: 2510.5 [P.O.:70; I.V.:1790.5; IV Piggyback:650] Out: 965 [Urine:950; Drains:15] Intake/Output this shift: Total I/O In: 640 [I.V.:490; IV Piggyback:150] Out: 400 [Urine:400]  NAD nonlabored resp Soft approp T mild D bandage c/d/i JP SS Urine clear yellow  Lab Results:   Recent Labs  09/10/16 0424 09/11/16 0613  WBC 19.0* 26.5*  HGB 7.6* 7.6*  HCT 23.2* 22.9*  PLT 478* 444*   BMET  Recent Labs  09/10/16 0424 09/11/16 0613  NA 138 138  K 3.6 4.0  CL 102 101  CO2 33* 33*  GLUCOSE 139* 124*  BUN 17 19  CREATININE 0.54 0.46  CALCIUM 7.4* 7.6*   PT/INR No results for input(s): LABPROT, INR in the last 72 hours. ABG No results for input(s): PHART, HCO3 in the last 72 hours.  Invalid input(s): PCO2, PO2  Studies/Results: Ct Abdomen Pelvis W Contrast  Result Date: 09/11/2016 CLINICAL DATA:  Patient status post ureteral reimplantation after transsection. Recent percutaneous drainage placement within the pelvis. EXAM: CT ABDOMEN AND PELVIS  WITH CONTRAST TECHNIQUE: Multidetector CT imaging of the abdomen and pelvis was performed using the standard protocol following bolus administration of intravenous contrast. CONTRAST:  100 cc Isovue-300 COMPARISON:  CT abdomen pelvis 08/30/2016. FINDINGS: Lower chest: Normal heart size. Large hiatal hernia. Moderate layering bilateral pleural effusions. Consolidation within the lower lungs bilaterally. Hepatobiliary: Liver is normal in size and contour. No focal hepatic lesion is identified. Layering sludge within the gallbladder lumen. Pancreas: Unremarkable Spleen: Unremarkable Adrenals/Urinary Tract: Normal adrenal glands. Kidneys enhance symmetrically with contrast. Left-sided double-J nephroureteral stent is in place. Urinary bladder is mildly distended. There is mild left hydronephrosis. Stomach/Bowel: Large hiatal hernia. Postsurgical changes compatible with ascending colectomy. Gas and fluid collections extend adjacent to the anastomosis involving the colon (image 43; series 5). Anastomotic breakdown is not excluded. No evidence for bowel obstruction. Sigmoid colonic diverticulosis. No CT evidence for acute diverticulitis. Vascular/Lymphatic: Normal caliber abdominal aorta. Peripheral calcified atherosclerotic plaque. No retroperitoneal lymphadenopathy. Reproductive: Status post hysterectomy. Other: Interval placement of percutaneous drainage catheter terminating in the left hemipelvis. Near complete resolution of previously described pelvic fluid collection. There are persistent peripherally enhancing gas and fluid collections insinuated amongst the small bowel predominately within the right lower quadrant and anterior abdomen measuring up to 8.1 x 3.0 cm. This communicates more superiorly and anteriorly with a fluid and gas containing collection measuring 6.7 x 6.3 cm (image 61; series 2). Musculoskeletal: Anasarca. Lower thoracic and lumbar spine degenerative changes. No aggressive or acute appearing  osseous lesions. IMPRESSION: Interval placement  of drainage catheter terminating within the left hemipelvis with significant interval decrease in size of previously described pelvic fluid collection. Additionally within the central abdomen and right lower abdomen there is a large rim enhancing gas and fluid containing collection concerning for abscess. This fluid collection extends to the level of the anastomosis involving the colon. The possibility of anastomotic breakdown as a causative etiology is not excluded. Left-sided double-J nephroureteral stent in place. There is mild left hydronephrosis. Moderate bilateral layering pleural effusions and underlying pulmonary consolidation. Large hiatal hernia. Anasarca. Aortic atherosclerosis. These results will be called to the ordering clinician or representative by the Radiologist Assistant, and communication documented in the PACS or zVision Dashboard. Electronically Signed   By: Lovey Newcomer M.D.   On: 09/11/2016 12:05   Dg Chest Port 1 View  Result Date: 09/11/2016 CLINICAL DATA:  Respiratory failure EXAM: PORTABLE CHEST 1 VIEW COMPARISON:  September 09, 2016 FINDINGS: Central catheter tip is in superior vena cava. No pneumothorax. There are bilateral pleural effusions with atelectatic change in both lung bases. There is cardiomegaly with pulmonary venous hypertension. No adenopathy evident. There is atherosclerotic calcification aorta. No bone lesions are evident. IMPRESSION: Evidence of congestive heart failure. Bibasilar atelectasis present. No pneumothorax. There is aortic atherosclerosis. In comparison with most recent study, right pleural effusion may be slightly larger. Electronically Signed   By: Lowella Grip III M.D.   On: 09/11/2016 07:04    Anti-infectives: Anti-infectives    Start     Dose/Rate Route Frequency Ordered Stop   09/09/16 1600  vancomycin (VANCOCIN) 1,250 mg in sodium chloride 0.9 % 250 mL IVPB     1,250 mg 166.7 mL/hr over 90  Minutes Intravenous Every 18 hours 09/09/16 0912     09/09/16 1000  vancomycin (VANCOCIN) IVPB 1000 mg/200 mL premix     1,000 mg 200 mL/hr over 60 Minutes Intravenous  Once 09/09/16 0912 09/09/16 1215   09/08/16 0600  vancomycin (VANCOCIN) IVPB 750 mg/150 ml premix  Status:  Discontinued     750 mg 150 mL/hr over 60 Minutes Intravenous Every 12 hours 09/07/16 2123 09/08/16 0922   09/07/16 2300  vancomycin (VANCOCIN) IVPB 1000 mg/200 mL premix     1,000 mg 200 mL/hr over 60 Minutes Intravenous  Once 09/07/16 2123 09/08/16 0017   08/16/2016 0900  piperacillin-tazobactam (ZOSYN) IVPB 3.375 g  Status:  Discontinued     3.375 g 12.5 mL/hr over 240 Minutes Intravenous Every 8 hours 08/18/2016 0337 08/14/2016 0434   08/17/2016 0900  piperacillin-tazobactam (ZOSYN) IVPB 3.375 g     3.375 g 12.5 mL/hr over 240 Minutes Intravenous Every 8 hours 09/09/2016 0434        Assessment/Plan:  1 - Left Ureteral Injury- remains stable from GU perspective. Replace foley. Do NOT discontinue any drains including foley without discussing with urology first. Continue all current drains/tubes until ileus completely resolved and tolerating PO well and out of ICU, then consider sequential removal. We will continue to follow.  2 - ? New abdominal abscess - management per surgery.  Horald Pollen [facility] 09/11/2016

## 2016-09-11 NOTE — Progress Notes (Addendum)
Pharmacy Antibiotic Note  Katherine Medina is a 82 y.o. female admitted on 09/08/2016 with pelvic abscess.  Pharmacy has been consulted for vancomycin dosing for possible HCAP. Patient with puss drained from abscess on 12/29. Per conversation with surgery, will discontinue Zosyn and change to meropenem. Wound culture sent on 12/29.   Plan: Initiate meropenem 1g IV q8hr.    Continue vancomycin 1250mg  IV Q18hr for goal trough of 15-20. Will obtain trough prior to 5th dose of vancomycin.    Height: 5\' 4"  (162.6 cm) Weight: 179 lb 10.8 oz (81.5 kg) IBW/kg (Calculated) : 54.7  Temp (24hrs), Avg:98.9 F (37.2 C), Min:98.5 F (36.9 C), Max:99.8 F (37.7 C)   Recent Labs Lab 09/07/16 0648 09/07/16 1346 09/08/16 0311 09/09/16 0350 09/10/16 0424 09/11/16 0613  WBC SPECIMEN CONTAMINATED, UNABLE TO PERFORM TEST(S). 20.1* 19.0* 18.5* 19.0* 26.5*  CREATININE 0.37*  --  0.55 0.60 0.54 0.46    Estimated Creatinine Clearance: 53.1 mL/min (by C-G formula based on SCr of 0.46 mg/dL).    No Known Allergies  Antimicrobials this admission: Zosyn 12/18 >> 12/29 Vancomycin 12/27 >>  Meropenem 12/29 >>   Dose adjustments this admission: N/A  Microbiology results: 12/18 MRSA PCR: negative 12/25 Body Fluid Cx: no growth x 4 days  12/26 Sputum: moderate yeast  12/27 BCx: no growth x 2 days  12/29 WoundCx: sent  Pharmacy will continue to monitor and adjust per consult.   Razia Screws L 09/11/2016 2:37 PM

## 2016-09-11 NOTE — Progress Notes (Signed)
82 year old status post left ureteral re- implantation after transection from hysterectomy. Patient feeling better earlier this a.m. and breathing a little more comfortably although still slightly short of breath just sentences. The patient had some  anal along the left flank likely related to the stent. She otherwise doesn't have any complaints but is hopeful that she will begin to improve.   Vitals:   09/11/16 1200 09/11/16 1300  BP: (!) 147/65 (!) 125/53  Pulse: (!) 115 (!) 104  Resp: (!) 27 20  Temp:     I/O last 3 completed shifts: In: 2850.5 [P.O.:70; I.V.:2130.5; IV Piggyback:650] Out: 2573 [Urine:2550; Drains:23] Total I/O In: 640 [I.V.:490; IV Piggyback:150] Out: 400 [Urine:400]   PE:  Gen: NAD Res: short of breath with talking, some slight desaturation to 80s while on treatment, crackles in bilateral bases Cardio: Tachycardic, no murmur Abd: soft, incisions c/d/i, JPdrain serous drainage, moderately distended, appropriately tender GU: foley in place, draininage yellow urine Ext: 1+edema   CBC Latest Ref Rng & Units 09/11/2016 09/10/2016 09/09/2016  WBC 3.6 - 11.0 K/uL 26.5(H) 19.0(H) 18.5(H)  Hemoglobin 12.0 - 16.0 g/dL 7.6(L) 7.6(L) 7.2(L)  Hematocrit 35.0 - 47.0 % 22.9(L) 23.2(L) 21.7(L)  Platelets 150 - 440 K/uL 444(H) 478(H) 439   CMP Latest Ref Rng & Units 09/11/2016 09/10/2016 09/09/2016  Glucose 65 - 99 mg/dL 124(H) 139(H) 127(H)  BUN 6 - 20 mg/dL 19 17 16   Creatinine 0.44 - 1.00 mg/dL 0.46 0.54 0.60  Sodium 135 - 145 mmol/L 138 138 138  Potassium 3.5 - 5.1 mmol/L 4.0 3.6 3.9  Chloride 101 - 111 mmol/L 101 102 103  CO2 22 - 32 mmol/L 33(H) 33(H) 30  Calcium 8.9 - 10.3 mg/dL 7.6(L) 7.4(L) 7.1(L)  Total Protein 6.5 - 8.1 g/dL - 5.5(L) -  Total Bilirubin 0.3 - 1.2 mg/dL - 0.2(L) -  Alkaline Phos 38 - 126 U/L - 92 -  AST 15 - 41 U/L - 30 -  ALT 14 - 54 U/L - 17 -     A/P:  82 year old status post left ureteral re- implantation after transection  Pain:   Prn dilaudid iv  Res: concern for pneumonia, continue abx vanc/ zosyn and pulm toilet and nebulizer treatments, keep in stepdown with Desaturations Cardio: tachycardia, continue metoprolol  GI: continue soft diet, likely still some ileus with distension but having stools and passing flatus, continue TPN until taking more po, continue PPI GU: continue JP drain minimal serous drainage and Foley as per urology  Hem/ID:  Leukocytosis, continue Vanc and Zosyn, continuing to increase will get CT scan abd/ pelvis Resetart PT as respiratory status allows, currently SOB with talking with some desaturations.

## 2016-09-11 NOTE — Progress Notes (Signed)
I evaluated her CT scan of her abdomen and pelvis along with my partner Dr. Burt Knack where her multiple areas of abscess collections could be seen on the CT scan read a question of anastomotic leakage. There was no evidence of this on the surgery a few days prior. I do believe that these fluid collections are still in place with abscess likely as a result of the complications that she had from her first surgery. It did appear that one area of these was just underneath the previous laparoscopic incision near the umbilicus. I removed the sterile glue from this area was easily able to open this wound up and place a hemostat through this fascial stitches and immediately drained 2-300 mL of foul-smelling purulent thick material. I got a 84 [place] JP drain from the OR and placed it in between these stitches in place a small nylon stitch at the skin to secure the drain. I packed the wound with a dry dressing and do expect that there will be some drainage around the JP drain as well. While in the room after doing this there was approximately another 100 mL of purulent material that was removed from the drain and then it started to become more serious and color about the same as the other JP drain.   I discussed the CT scan findings and my findings on her physical exam with abscess drainage with the ICU team Dr. Mortimer Fries as well as the medicine team Dr. Manuella Ghazi.  She also has a fairly significant right pleural effusion on the CT scan and in discussion with the critical care team and IR drainage of this has been requested. I sent a culture of the purulent drainage came from the wound and in discussion with the pharmacist have change the Zosyn to meropenem to make sure were providing coverage as well.   I had a long conversation with the husband, daughter and son-in-law about Ms. Upshaw's condition. She has been improving from a respiratory standpoint over the past few days but has been through significant amount of stress on  her body. I did discuss with the family that for patient in their 18s to undergo not one but 2 major options operations and then to have any complication from this they are high risk for further complications, needing to stay in the long-term health facility afterwards and have a high potential for death as well.  Unfortunately after having her first operation had a complication from it of a left ureteral transection which required a second major operation and she subsequently had a complication of pneumonia from this along with some respiratory failure and although she is slightly improved from this she now has a second complication of intra-abdominal abscess which is now been drained. She additionally has an albumin is 1.5. I explained all these things to the family and discussed with them that she is still in very critical condition and although has seemed to improve slightly is still very sick. I did discuss with them that her risk of having further complications and even death was is very high and that she definitely would need to be in a skilled nursing facility upon discharge, if we were able to get her there. I did begin to discuss with them that if things were to turn for the worse or if she would have a code situation what they would want done. The husband did say that they had discussed with all of the family and that if things were to turn  for the worse that he would want her not to suffer and to be as comfortable as possible. In discussion with him and the rest of the family I do not know that they have enough information to make a decision on CODE STATUS as of yet. I have requested a palliative care consult to be placed and discussed also with ICU team who will continue discussions as well.

## 2016-09-11 NOTE — Progress Notes (Signed)
Palliative Medicine Consult Order Noted. Due to high referral volume and limited staffing, there will be a delay seeing this patient. Palliative Medicine Provider will return to [facility] on 09/15/2016, and patient will be evaluated then. Please call the Palliative Medicine Team office at [phone] if recommendations are needed in the interim.  Thank you for inviting [place] to see this patient.  Marjie Skiff Paulla Mcclaskey, RN, BSN, [facility] 09/11/2016 4:12 PM Cell [phone] 8:00-4:00 Monday-Friday Office [phone]

## 2016-09-11 NOTE — Progress Notes (Signed)
MD Kasa stated RN did not need to transport along with patient to CT. Will continue to monitor patient.

## 2016-09-11 NOTE — Progress Notes (Signed)
[facility] NOTE   Pharmacy Consult for TPN  Indication: Prolonged ileus    Patient Measurements: Height: 5\' 4"  (162.6 cm) Weight: 179 lb 10.8 oz (81.5 kg) IBW/kg (Calculated) : 54.7 TPN AdjBW (KG): 59.7 Body mass index is 30.84 kg/m.   Assessment: Pharmacy consulted to assist in the management of electrolytes and glucose in this  82 year old woman receiving TPN for postop ileus.  Insulin requirements in the past 24 hours: 17 units    Best Practices: TPN Access: TPN start date: 12/21  Nutritional Goals (per RD recommendation on 2223 calories ):  Current Nutrition: NPO, TPN  Plan:  Continue Clinimix E 5/20 @ 15mL/hr with added MVI, trace and Thiamine 100mg  per bag. Will continue Q4h SSI. Will continue 20% lipid emulsion to infuse over 12 hours daily.   Electrolytes remain within normal limits. Unless otherwise clinically indicated, will recheck electrolytes with am labs on 12/31.    Pharmacy will continue to monitor and adjust per consult.   Heitor Steinhoff L 09/11/2016,2:38 PM

## 2016-09-11 NOTE — Progress Notes (Signed)
[place] [facility] [place] NAME: Altina Boisselle    MR#:  [id number]  DATE OF BIRTH:  [birth year]  SUBJECTIVE:  CHIEF COMPLAINT:   Chief Complaint  Patient presents with  . Post-op Problem    Transfer from [facility]  seems to be worsening clinically, found to have new abscess on CT abd today s/p drainage by surgicalist, JP drain in place REVIEW OF SYSTEMS:  Review of Systems  Constitutional: Positive for malaise/fatigue. Negative for chills, fever and weight loss.  HENT: Negative for nosebleeds and sore throat.   Eyes: Negative for blurred vision.  Respiratory: Negative for cough, shortness of breath and wheezing.   Cardiovascular: Negative for chest pain, orthopnea, leg swelling and PND.  Gastrointestinal: Positive for abdominal pain. Negative for constipation, diarrhea, heartburn, nausea and vomiting.  Genitourinary: Negative for dysuria and urgency.  Musculoskeletal: Negative for back pain.  Skin: Negative for rash.  Neurological: Positive for weakness. Negative for dizziness, speech change, focal weakness and headaches.  Endo/Heme/Allergies: Does not bruise/bleed easily.  Psychiatric/Behavioral: Negative for depression.   DRUG ALLERGIES:  No Known Allergies VITALS:  Blood pressure (!) 125/53, pulse (!) 104, temperature 98.5 F (36.9 C), temperature source Oral, resp. rate 20, height 5\' 4"  (1.626 m), weight 81.5 kg (179 lb 10.8 oz), SpO2 96 %. PHYSICAL EXAMINATION:  Physical Exam  Constitutional: She is oriented to person, place, and time. She appears malnourished. She appears unhealthy. She appears cachectic. She has a sickly appearance.  HENT:  Head: Normocephalic and atraumatic.  Eyes: Conjunctivae and EOM are normal. Pupils are equal, round, and reactive to light.  Neck: Normal range of motion. Neck supple. No tracheal deviation present. No thyromegaly present.  Cardiovascular: Normal rate, regular rhythm and normal heart sounds.     Pulmonary/Chest: She has decreased breath sounds. She has no wheezes. She exhibits no tenderness.  Abdominal: Soft. Bowel sounds are normal. She exhibits no distension. There is generalized tenderness.  right abdominal jp drain intact draining serosanguinous fluid   Musculoskeletal: Normal range of motion.  Neurological: She is alert and oriented to person, place, and time. No cranial nerve deficit.  Skin: Skin is warm and dry. No rash noted.  Psychiatric: Mood and affect normal.   LABORATORY PANEL:   CBC  Recent Labs Lab 09/11/16 0613  WBC 26.5*  HGB 7.6*  HCT 22.9*  PLT 444*   ------------------------------------------------------------------------------------------------------------------ Chemistries   Recent Labs Lab 09/10/16 0424 09/11/16 0613  NA 138 138  K 3.6 4.0  CL 102 101  CO2 33* 33*  GLUCOSE 139* 124*  BUN 17 19  CREATININE 0.54 0.46  CALCIUM 7.4* 7.6*  MG 1.9  --   AST 30  --   ALT 17  --   ALKPHOS 92  --   BILITOT 0.2*  --    RADIOLOGY:  Ct Abdomen Pelvis W Contrast  Result Date: 09/11/2016 CLINICAL DATA:  Patient status post ureteral reimplantation after transsection. Recent percutaneous drainage placement within the pelvis. EXAM: CT ABDOMEN AND PELVIS WITH CONTRAST TECHNIQUE: Multidetector CT imaging of the abdomen and pelvis was performed using the standard protocol following bolus administration of intravenous contrast. CONTRAST:  100 cc Isovue-300 COMPARISON:  CT abdomen pelvis 08/30/2016. FINDINGS: Lower chest: Normal heart size. Large hiatal hernia. Moderate layering bilateral pleural effusions. Consolidation within the lower lungs bilaterally. Hepatobiliary: Liver is normal in size and contour. No focal hepatic lesion is identified. Layering sludge within the gallbladder lumen. Pancreas: Unremarkable Spleen: Unremarkable Adrenals/Urinary  Tract: Normal adrenal glands. Kidneys enhance symmetrically with contrast. Left-sided double-J nephroureteral  stent is in place. Urinary bladder is mildly distended. There is mild left hydronephrosis. Stomach/Bowel: Large hiatal hernia. Postsurgical changes compatible with ascending colectomy. Gas and fluid collections extend adjacent to the anastomosis involving the colon (image 43; series 5). Anastomotic breakdown is not excluded. No evidence for bowel obstruction. Sigmoid colonic diverticulosis. No CT evidence for acute diverticulitis. Vascular/Lymphatic: Normal caliber abdominal aorta. Peripheral calcified atherosclerotic plaque. No retroperitoneal lymphadenopathy. Reproductive: Status post hysterectomy. Other: Interval placement of percutaneous drainage catheter terminating in the left hemipelvis. Near complete resolution of previously described pelvic fluid collection. There are persistent peripherally enhancing gas and fluid collections insinuated amongst the small bowel predominately within the right lower quadrant and anterior abdomen measuring up to 8.1 x 3.0 cm. This communicates more superiorly and anteriorly with a fluid and gas containing collection measuring 6.7 x 6.3 cm (image 61; series 2). Musculoskeletal: Anasarca. Lower thoracic and lumbar spine degenerative changes. No aggressive or acute appearing osseous lesions. IMPRESSION: Interval placement of drainage catheter terminating within the left hemipelvis with significant interval decrease in size of previously described pelvic fluid collection. Additionally within the central abdomen and right lower abdomen there is a large rim enhancing gas and fluid containing collection concerning for abscess. This fluid collection extends to the level of the anastomosis involving the colon. The possibility of anastomotic breakdown as a causative etiology is not excluded. Left-sided double-J nephroureteral stent in place. There is mild left hydronephrosis. Moderate bilateral layering pleural effusions and underlying pulmonary consolidation. Large hiatal hernia.  Anasarca. Aortic atherosclerosis. These results will be called to the ordering clinician or representative by the Radiologist Assistant, and communication documented in the PACS or zVision Dashboard. Electronically Signed   By: Lovey Newcomer M.D.   On: 09/11/2016 12:05   Dg Chest Port 1 View  Result Date: 09/11/2016 CLINICAL DATA:  Respiratory failure EXAM: PORTABLE CHEST 1 VIEW COMPARISON:  September 09, 2016 FINDINGS: Central catheter tip is in superior vena cava. No pneumothorax. There are bilateral pleural effusions with atelectatic change in both lung bases. There is cardiomegaly with pulmonary venous hypertension. No adenopathy evident. There is atherosclerotic calcification aorta. No bone lesions are evident. IMPRESSION: Evidence of congestive heart failure. Bibasilar atelectasis present. No pneumothorax. There is aortic atherosclerosis. In comparison with most recent study, right pleural effusion may be slightly larger. Electronically Signed   By: Lowella Grip III M.D.   On: 09/11/2016 07:04   ASSESSMENT AND PLAN:   * Acute hypoxic respiratory failure secondary to bilateral pleural effusions and atelectasis s/p hemicolectomy and hysterectomy with injuries to urinary tract - s/p Lt thora on 12/25 and may need on rt side now. - continue Vancomycin and Meropenem for HAP  * Hospital Acquired PNA - Vanco + Meropenem  * Hypokalemia - repleted and resolved  * Acute on chronic anemia - Hb 7.9 -> 7.2->7.6  * Surgery for hemicolectomy with injuries to urinary tract and urologic surgery.   Continue management as per primary team .   continue Zosyn.Vancomycin, added  * Severe protein-calorie malnutrition   Albumin is 1.3, patient is on TPN.   Continue monitoring.  * Leukocytosis: 10 ->18.5->19->26.5 - monitor while on Abx (vanco + Meropenem) - Consider Infectious disease consultation   She is looking sick.  If no much improvement, consider palliative care consultation. D/w Dr  Azalee Course and Dr Mortimer Fries   All the records are reviewed and case discussed with Care Management/Social Worker. Management  plans discussed with the patient, family and they are in agreement.  CODE STATUS: FULL CODE  TOTAL TIME (critical care) TAKING CARE OF THIS PATIENT: 25 minutes.   More than 50% of the time was spent in counseling/coordination of care: YES   Max Sane M.D on 09/11/2016 at 5:14 PM  Between 7am to 6pm - Pager - [phone]  After 6pm go to [email or web address] - Proofreader  [facility]  Office  [phone]  CC: Primary care physician; Madelyn Brunner, MD  Note: This dictation was prepared with Dragon dictation along with smaller phrase technology. Any transcriptional errors that result from this process are unintentional.

## 2016-09-11 NOTE — Clinical Social Work Note (Signed)
CSW noted that PT has recommended SNF vs LTAC level of care at discharge. RN CM is currently working on getting patient to [employer] [facility].  Shela Leff MSW,LCSW [phone]

## 2016-09-12 ENCOUNTER — Inpatient Hospital Stay: Payer: Medicare Other

## 2016-09-12 LAB — CBC WITH DIFFERENTIAL/PLATELET
BASOS ABS: 0.1 10*3/uL (ref 0–0.1)
BASOS PCT: 0 %
Eosinophils Absolute: 0.8 10*3/uL — ABNORMAL HIGH (ref 0–0.7)
Eosinophils Relative: 3 %
HEMATOCRIT: 20.6 % — AB (ref 35.0–47.0)
HEMOGLOBIN: 6.6 g/dL — AB (ref 12.0–16.0)
LYMPHS PCT: 4 %
Lymphs Abs: 1 10*3/uL (ref 1.0–3.6)
MCH: 27.2 pg (ref 26.0–34.0)
MCHC: 32.1 g/dL (ref 32.0–36.0)
MCV: 84.8 fL (ref 80.0–100.0)
MONO ABS: 1.9 10*3/uL — AB (ref 0.2–0.9)
Monocytes Relative: 8 %
NEUTROS ABS: 19.2 10*3/uL — AB (ref 1.4–6.5)
Neutrophils Relative %: 85 %
Platelets: 518 10*3/uL — ABNORMAL HIGH (ref 150–440)
RBC: 2.43 MIL/uL — AB (ref 3.80–5.20)
RDW: 15.1 % — AB (ref 11.5–14.5)
WBC: 23 10*3/uL — AB (ref 3.6–11.0)

## 2016-09-12 LAB — BASIC METABOLIC PANEL
Anion gap: 5 (ref 5–15)
BUN: 21 mg/dL — ABNORMAL HIGH (ref 6–20)
CHLORIDE: 104 mmol/L (ref 101–111)
CO2: 29 mmol/L (ref 22–32)
CREATININE: 0.54 mg/dL (ref 0.44–1.00)
Calcium: 7.8 mg/dL — ABNORMAL LOW (ref 8.9–10.3)
GFR calc non Af Amer: >60 mL/min (ref >60)
Glucose, Bld: 138 mg/dL — ABNORMAL HIGH (ref 65–99)
Potassium: 4.9 mmol/L (ref 3.5–5.1)
SODIUM: 138 mmol/L (ref 135–145)

## 2016-09-12 LAB — PHOSPHORUS: PHOSPHORUS: 2.1 mg/dL — AB (ref 2.5–4.6)

## 2016-09-12 LAB — GLUCOSE, CAPILLARY
GLUCOSE-CAPILLARY: 124 mg/dL — AB (ref 65–99)
GLUCOSE-CAPILLARY: 137 mg/dL — AB (ref 65–99)
GLUCOSE-CAPILLARY: 137 mg/dL — AB (ref 65–99)
GLUCOSE-CAPILLARY: 172 mg/dL — AB (ref 65–99)
Glucose-Capillary: 115 mg/dL — ABNORMAL HIGH (ref 65–99)
Glucose-Capillary: 142 mg/dL — ABNORMAL HIGH (ref 65–99)

## 2016-09-12 LAB — CULTURE, BODY FLUID W GRAM STAIN -BOTTLE

## 2016-09-12 LAB — CULTURE, BODY FLUID-BOTTLE: CULTURE: NO GROWTH

## 2016-09-12 LAB — MAGNESIUM: MAGNESIUM: 1.8 mg/dL (ref 1.7–2.4)

## 2016-09-12 LAB — PREPARE RBC (CROSSMATCH)

## 2016-09-12 MED ORDER — HYDROMORPHONE HCL 1 MG/ML IJ SOLN
1.0000 mg | INTRAMUSCULAR | Status: DC | PRN
  Administered 2016-09-12: 1 mg via INTRAVENOUS
  Administered 2016-09-12: 0.5 mg via INTRAVENOUS
  Administered 2016-09-13 (×2): 1 mg via INTRAVENOUS
  Administered 2016-09-13: 0.5 mg via INTRAVENOUS
  Filled 2016-09-12 (×2): qty 0.5
  Filled 2016-09-12 (×3): qty 1
  Filled 2016-09-12: qty 0.5

## 2016-09-12 MED ORDER — FUROSEMIDE 10 MG/ML IJ SOLN
20.0000 mg | Freq: Once | INTRAMUSCULAR | Status: AC
  Administered 2016-09-12: 20 mg via INTRAVENOUS

## 2016-09-12 MED ORDER — SODIUM CHLORIDE 0.9 % IV SOLN: Freq: Once | INTRAVENOUS | Status: DC

## 2016-09-12 MED ORDER — TRACE MINERALS CR-CU-MN-SE-ZN 10-1000-500-60 MCG/ML IV SOLN
INTRAVENOUS | Status: AC
  Administered 2016-09-12: 21:00:00 via INTRAVENOUS
  Filled 2016-09-12: qty 1680

## 2016-09-12 MED ORDER — SODIUM PHOSPHATES 45 MMOLE/15ML IV SOLN
15.0000 mmol | Freq: Once | INTRAVENOUS | Status: AC
  Administered 2016-09-12: 15 mmol via INTRAVENOUS
  Filled 2016-09-12: qty 5

## 2016-09-12 MED ORDER — FLUCONAZOLE IN SODIUM CHLORIDE 400-0.9 MG/200ML-% IV SOLN
400.0000 mg | INTRAVENOUS | Status: DC
  Administered 2016-09-12 – 2016-09-13 (×2): 400 mg via INTRAVENOUS
  Filled 2016-09-12 (×3): qty 200

## 2016-09-12 MED ORDER — IPRATROPIUM-ALBUTEROL 0.5-2.5 (3) MG/3ML IN SOLN
3.0000 mL | RESPIRATORY_TRACT | Status: DC
  Administered 2016-09-12 – 2016-09-13 (×6): 3 mL via RESPIRATORY_TRACT
  Filled 2016-09-12 (×6): qty 3

## 2016-09-12 MED ORDER — VANCOMYCIN HCL 10 G IV SOLR
1250.0000 mg | Freq: Two times a day (BID) | INTRAVENOUS | Status: DC
  Administered 2016-09-12 – 2016-09-13 (×3): 1250 mg via INTRAVENOUS
  Filled 2016-09-12 (×5): qty 1250

## 2016-09-12 MED ORDER — FUROSEMIDE 10 MG/ML IJ SOLN
INTRAMUSCULAR | Status: AC
  Administered 2016-09-12: 20 mg via INTRAVENOUS
  Filled 2016-09-12: qty 2

## 2016-09-12 MED ORDER — LORAZEPAM 2 MG/ML IJ SOLN
1.0000 mg | INTRAMUSCULAR | Status: DC | PRN
  Administered 2016-09-13: 1 mg via INTRAVENOUS
  Administered 2016-09-13: 0.5 mg via INTRAVENOUS
  Filled 2016-09-12 (×3): qty 1

## 2016-09-12 NOTE — Progress Notes (Signed)
Pt seen and examined. Received 1 unit of RBC and developed some tachypnea and was placed on bipap. She feels well and denies any complaints VSS JP bilious output and growing mixed bacteria and yeast. Appropriate A/Bs  PE NAD Abd: soft, Dressing w bilious stained D/C. , soft, NT. No peritonitis Ext Anasarca  A/P concerned for fistula either from SB of anastomosis. Currently not septic and will treat it conservatively w A/Bs, TPN and draianage. No surgical intervention \\continue  ICU care D/W family in detail and they understand

## 2016-09-12 NOTE — Progress Notes (Signed)
[place] at [place] NAME: Talaiyah Weekes    MR#:  [id number]  DATE OF BIRTH:  1929/09/07  SUBJECTIVE:  CHIEF COMPLAINT:   Chief Complaint  Patient presents with  . Post-op Problem    Transfer from [facility]  Patient had drainage of the new abscess yesterday 09/11/2016 by surgicalist , JP drain in place Not feeling well today but wants to go home. Granddaughter and daughter are at bedside REVIEW OF SYSTEMS:  Review of Systems  Constitutional: Positive for malaise/fatigue. Negative for chills, fever and weight loss.  HENT: Negative for nosebleeds and sore throat.   Eyes: Negative for blurred vision.  Respiratory: Positive for shortness of breath. Negative for cough and wheezing.   Cardiovascular: Negative for chest pain, orthopnea, leg swelling and PND.  Gastrointestinal: Positive for abdominal pain. Negative for constipation, diarrhea, heartburn, nausea and vomiting.  Genitourinary: Negative for dysuria and urgency.  Musculoskeletal: Negative for back pain.  Skin: Negative for rash.  Neurological: Positive for weakness. Negative for dizziness, speech change, focal weakness and headaches.  Endo/Heme/Allergies: Does not bruise/bleed easily.  Psychiatric/Behavioral: Negative for depression.   DRUG ALLERGIES:  No Known Allergies VITALS:  Blood pressure 131/63, pulse (!) 122, temperature 98 F (36.7 C), temperature source Oral, resp. rate (!) 28, height 5\' 4"  (1.626 m), weight 81.5 kg (179 lb 10.8 oz), SpO2 95 %. PHYSICAL EXAMINATION:  Physical Exam  Constitutional: She is oriented to person, place, and time. She appears malnourished. She appears unhealthy. She appears cachectic. She has a sickly appearance.  HENT:  Head: Normocephalic and atraumatic.  Eyes: Conjunctivae and EOM are normal. Pupils are equal, round, and reactive to light.  Neck: Normal range of motion. Neck supple. No tracheal deviation present. No thyromegaly present.    Cardiovascular: Normal rate, regular rhythm and normal heart sounds.   Pulmonary/Chest: She has decreased breath sounds. She has no wheezes. She exhibits no tenderness.  Abdominal: Soft. Bowel sounds are normal. She exhibits no distension. There is generalized tenderness.  right abdominal jp drain intact draining serosanguinous fluid   Musculoskeletal: Normal range of motion.  Neurological: She is alert and oriented to person, place, and time. No cranial nerve deficit.  Skin: Skin is warm and dry. No rash noted.  Psychiatric: Mood and affect normal.   LABORATORY PANEL:   CBC  Recent Labs Lab 09/12/16 0514  WBC 23.0*  HGB 6.6*  HCT 20.6*  PLT 518*   ------------------------------------------------------------------------------------------------------------------ Chemistries   Recent Labs Lab 09/10/16 0424  09/12/16 0818  NA 138  < > 138  K 3.6  < > 4.9  CL 102  < > 104  CO2 33*  < > 29  GLUCOSE 139*  < > 138*  BUN 17  < > 21*  CREATININE 0.54  < > 0.54  CALCIUM 7.4*  < > 7.8*  MG 1.9  --  1.8  AST 30  --   --   ALT 17  --   --   ALKPHOS 92  --   --   BILITOT 0.2*  --   --   < > = values in this interval not displayed. RADIOLOGY:  Dg Chest 1 View  Result Date: 09/11/2016 CLINICAL DATA:  82 year old female status post right-sided thoracentesis EXAM: CHEST 1 VIEW COMPARISON:  Prior chest x-ray 09/11/2016 FINDINGS: Significant interval reduction in right-sided pleural effusion. Improved expansion of the right lung with some residual atelectasis in the right base. No evidence of pneumothorax.  Persistent small a moderate layering left pleural effusion with associated atelectasis. Unchanged cardiomegaly. Massive hiatal hernia. Right upper extremity PICC with the catheter tip overlying the superior cavoatrial junction. Atherosclerotic calcifications again noted in the transverse aorta. IMPRESSION: 1. No evidence of pneumothorax status post right-sided thoracentesis. 2. No  significant residual right-sided pleural effusion. 3. Unchanged small to moderate layering left pleural effusion. Electronically Signed   By: Jacqulynn Cadet M.D.   On: 09/11/2016 17:43   US Thoracentesis Asp Pleural Space W/img Guide  Result Date: 09/11/2016 INDICATION: 82 year old female with symptomatic right pleural effusion EXAM: ULTRASOUND GUIDED RIGHT THORACENTESIS MEDICATIONS: None. COMPLICATIONS: None immediate. PROCEDURE: An ultrasound guided thoracentesis was thoroughly discussed with the patient and questions answered. The benefits, risks, alternatives and complications were also discussed. The patient understands and wishes to proceed with the procedure. Written consent was obtained. Ultrasound was performed to localize and mark an adequate pocket of fluid in the right chest. The area was then prepped and draped in the normal sterile fashion. 1% Lidocaine was used for local anesthesia. Under ultrasound guidance a 6 Fr Safe-T-Centesis catheter was introduced. Thoracentesis was performed. The catheter was removed and a dressing applied. FINDINGS: A total of approximately 900 mL of amber colored pleural fluid was removed. Samples were sent to the laboratory as requested by the clinical team. IMPRESSION: Successful ultrasound guided right thoracentesis yielding 900 mL of pleural fluid. Electronically Signed   By: Jacqulynn Cadet M.D.   On: 09/11/2016 17:44   ASSESSMENT AND PLAN:   * Acute hypoxic respiratory failure secondary to bilateral pleural effusions and atelectasis s/p hemicolectomy and hysterectomy with injuries to urinary tract - s/p Lt thora on 12/25 and may need on rt side now.Follow up with pulmonology - continue Vancomycin and Meropenem for HAP  * Acute on chronic anemia - Hb 7.9 -> 7.2->7.6-->6.6  for 1 unit of blood transfusion today  * Intra-abdominal abscess status post incision and drainage-JP drain placed by surgery Continue current IV antibiotics vancomycin and  meropenem. Consider ID consult  * Hospital Acquired PNA - Vanco + Meropenem  * Hypokalemia - repleted and resolved   * Surgery for hemicolectomy with injuries to urinary tract and urologic surgery.   Continue management as per primary team .   continue Zosyn.Vancomycin, added -Abdominal wound infection with budding yeast fluconazole added to the regimen  * Severe protein-calorie malnutrition with anasarca from third spacing   Albumin is 1.3, patient is on TPN.   Continue monitoring.  * Leukocytosis: 10 ->18.5->19->26.5--> 23.5 - monitor while on Abx (vanco + Meropenem) - Consider Infectious disease consultation   She is looking critically ill .  If no much improvement, consider palliative care consultation. D/w Dr Burt Knack today.  All the records are reviewed and case discussed with Care Management/Social Worker. Management plans discussed with the patient, daughter Anne Ng and granddaughter and they are in agreement.  CODE STATUS: FULL CODE-healthcare power of attorney husband, 2 daughters Grase and Barbara  TOTAL TIME (critical care) TAKING CARE OF THIS PATIENT: 35 minutes.   More than 50% of the time was spent in counseling/coordination of care: Rosemary Holms M.D on 09/12/2016 at 12:37 PM  Between 7am to 6pm - Pager - [phone]  After 6pm go to [email or web address] - Proofreader  [facility]  Office  [phone]  CC: Primary care physician; Madelyn Brunner, MD  Note: This dictation was prepared with Dragon dictation along with smaller phrase technology. Any transcriptional errors  that result from this process are unintentional.

## 2016-09-12 NOTE — Progress Notes (Addendum)
[facility] CONSULT NOTE   Pharmacy Consult for TPN  Indication: Prolonged ileus    Patient Measurements: Height: 5\' 4"  (162.6 cm) Weight: 179 lb 10.8 oz (81.5 kg) IBW/kg (Calculated) : 54.7 TPN AdjBW (KG): 59.7 Body mass index is 30.84 kg/m.   Assessment: Pharmacy consulted to assist in the management of electrolytes and glucose in this  82 year old woman receiving TPN for postop ileus.  Insulin requirements in the past 24 hours: 11 units    Best Practices: TPN Access: TPN start date: 12/21  Nutritional Goals (per RD recommendation on 2223 calories ):  Current Nutrition: NPO, TPN  Plan:  Continue Clinimix E 5/20 @ 31mL/hr with added MVI, trace and Thiamine 100mg  per bag. Will continue Q4h SSI. Will continue 20% lipid emulsion to infuse over 12 hours daily.   Phosphorus = 2.1. Supplemented with Sodium Phosphate 54mmol IV x 1. Unless otherwise clinically indicated, will recheck electrolytes with am labs on 12/31.    Pharmacy will continue to monitor and adjust per consult.   Olivia Canter, RPh Clinical Pharmacist 09/12/2016,10:02 AM

## 2016-09-12 NOTE — Progress Notes (Signed)
Pt is about 3/4 of the way done with blood transfusion, upon ausculation having rhonchi that was not previously noted.  Respiratory rate in 20's, requiring 3L [place].  Other other vital signs within normal limits.  Notified Dr. Burt Knack of change in respiratory status, per Dr. Burt Knack order for 20 mg iv lasix once.

## 2016-09-12 NOTE — Progress Notes (Signed)
12 Days Post-Op  Subjective:  1 - Left Ureteral Injury- s/p open LEFT ureteral reimplant with bilat retrograde, and left JJ stent 12/18 by Louis Meckel as primary repair of delayed left ureteral injury after hemicolectomy. KUB 12/23 with JJ stent and surgical drain in good position. She has increased bilous output from her JP drains.  Objective: Vital signs in last 24 hours: Temp:  [98 F (36.7 C)-98.6 F (37 C)] 98 F (36.7 C) (12/30 1830) Pulse Rate:  [80-132] 108 (12/30 2100) Resp:  [14-39] 32 (12/30 2100) BP: (95-156)/(36-74) 135/57 (12/30 2100) SpO2:  [86 %-98 %] 98 % (12/30 2100) FiO2 (%):  [35 %] 35 % (12/30 2020) Last BM Date: 09/09/16  Intake/Output from previous day: 12/29 0701 - 12/30 0700 In: 2228.2 [I.V.:2028.2; IV Piggyback:200] Out: 2260 [id number]; Drains:310] Intake/Output this shift: No intake/output data recorded.  NAD nonlabored resp Soft approp T mild D bandage c/d/i JP SS Urine clear yellow  Lab Results:   Recent Labs  09/11/16 0613 09/12/16 0514  WBC 26.5* 23.0*  HGB 7.6* 6.6*  HCT 22.9* 20.6*  PLT 444* 518*   BMET  Recent Labs  09/11/16 0613 09/12/16 0818  NA 138 138  K 4.0 4.9  CL 101 104  CO2 33* 29  GLUCOSE 124* 138*  BUN 19 21*  CREATININE 0.46 0.54  CALCIUM 7.6* 7.8*   PT/INR No results for input(s): LABPROT, INR in the last 72 hours. ABG No results for input(s): PHART, HCO3 in the last 72 hours.  Invalid input(s): PCO2, PO2  Studies/Results: Dg Chest 1 View  Result Date: 09/11/2016 CLINICAL DATA:  82 year old female status post right-sided thoracentesis EXAM: CHEST 1 VIEW COMPARISON:  Prior chest x-ray 09/11/2016 FINDINGS: Significant interval reduction in right-sided pleural effusion. Improved expansion of the right lung with some residual atelectasis in the right base. No evidence of pneumothorax. Persistent small a moderate layering left pleural effusion with associated atelectasis. Unchanged cardiomegaly. Massive  hiatal hernia. Right upper extremity PICC with the catheter tip overlying the superior cavoatrial junction. Atherosclerotic calcifications again noted in the transverse aorta. IMPRESSION: 1. No evidence of pneumothorax status post right-sided thoracentesis. 2. No significant residual right-sided pleural effusion. 3. Unchanged small to moderate layering left pleural effusion. Electronically Signed   By: Jacqulynn Cadet M.D.   On: 09/11/2016 17:43   Ct Abdomen Pelvis W Contrast  Result Date: 09/11/2016 CLINICAL DATA:  Patient status post ureteral reimplantation after transsection. Recent percutaneous drainage placement within the pelvis. EXAM: CT ABDOMEN AND PELVIS WITH CONTRAST TECHNIQUE: Multidetector CT imaging of the abdomen and pelvis was performed using the standard protocol following bolus administration of intravenous contrast. CONTRAST:  100 cc Isovue-300 COMPARISON:  CT abdomen pelvis 08/30/2016. FINDINGS: Lower chest: Normal heart size. Large hiatal hernia. Moderate layering bilateral pleural effusions. Consolidation within the lower lungs bilaterally. Hepatobiliary: Liver is normal in size and contour. No focal hepatic lesion is identified. Layering sludge within the gallbladder lumen. Pancreas: Unremarkable Spleen: Unremarkable Adrenals/Urinary Tract: Normal adrenal glands. Kidneys enhance symmetrically with contrast. Left-sided double-J nephroureteral stent is in place. Urinary bladder is mildly distended. There is mild left hydronephrosis. Stomach/Bowel: Large hiatal hernia. Postsurgical changes compatible with ascending colectomy. Gas and fluid collections extend adjacent to the anastomosis involving the colon (image 43; series 5). Anastomotic breakdown is not excluded. No evidence for bowel obstruction. Sigmoid colonic diverticulosis. No CT evidence for acute diverticulitis. Vascular/Lymphatic: Normal caliber abdominal aorta. Peripheral calcified atherosclerotic plaque. No retroperitoneal  lymphadenopathy. Reproductive: Status post hysterectomy. Other: Interval placement of  percutaneous drainage catheter terminating in the left hemipelvis. Near complete resolution of previously described pelvic fluid collection. There are persistent peripherally enhancing gas and fluid collections insinuated amongst the small bowel predominately within the right lower quadrant and anterior abdomen measuring up to 8.1 x 3.0 cm. This communicates more superiorly and anteriorly with a fluid and gas containing collection measuring 6.7 x 6.3 cm (image 61; series 2). Musculoskeletal: Anasarca. Lower thoracic and lumbar spine degenerative changes. No aggressive or acute appearing osseous lesions. IMPRESSION: Interval placement of drainage catheter terminating within the left hemipelvis with significant interval decrease in size of previously described pelvic fluid collection. Additionally within the central abdomen and right lower abdomen there is a large rim enhancing gas and fluid containing collection concerning for abscess. This fluid collection extends to the level of the anastomosis involving the colon. The possibility of anastomotic breakdown as a causative etiology is not excluded. Left-sided double-J nephroureteral stent in place. There is mild left hydronephrosis. Moderate bilateral layering pleural effusions and underlying pulmonary consolidation. Large hiatal hernia. Anasarca. Aortic atherosclerosis. These results will be called to the ordering clinician or representative by the Radiologist Assistant, and communication documented in the PACS or zVision Dashboard. Electronically Signed   By: Lovey Newcomer M.D.   On: 09/11/2016 12:05   Dg Chest Port 1 View  Result Date: 09/12/2016 CLINICAL DATA:  PICC placement. EXAM: PORTABLE CHEST 1 VIEW COMPARISON:  Radiograph of September 11, 2016. FINDINGS: Stable cardiomegaly. Atherosclerosis of thoracic aorta is noted. Stable large hiatal hernia. Stable bilateral perihilar  and basilar opacities are noted concerning for edema. Stable mild left pleural effusion is noted. No pneumothorax is noted. Right-sided PICC line is unchanged in position, with distal tip in expected position of cavoatrial junction. IMPRESSION: Stable position of right-sided PICC line. Aortic atherosclerosis. Stable large hiatal hernia. Stable bilateral lung opacities as described above. Electronically Signed   By: Marijo Conception, M.D.   On: 09/12/2016 19:59   Dg Chest Port 1 View  Result Date: 09/11/2016 CLINICAL DATA:  Respiratory failure EXAM: PORTABLE CHEST 1 VIEW COMPARISON:  September 09, 2016 FINDINGS: Central catheter tip is in superior vena cava. No pneumothorax. There are bilateral pleural effusions with atelectatic change in both lung bases. There is cardiomegaly with pulmonary venous hypertension. No adenopathy evident. There is atherosclerotic calcification aorta. No bone lesions are evident. IMPRESSION: Evidence of congestive heart failure. Bibasilar atelectasis present. No pneumothorax. There is aortic atherosclerosis. In comparison with most recent study, right pleural effusion may be slightly larger. Electronically Signed   By: Lowella Grip III M.D.   On: 09/11/2016 07:04   US Thoracentesis Asp Pleural Space W/img Guide  Result Date: 09/11/2016 INDICATION: 82 year old female with symptomatic right pleural effusion EXAM: ULTRASOUND GUIDED RIGHT THORACENTESIS MEDICATIONS: None. COMPLICATIONS: None immediate. PROCEDURE: An ultrasound guided thoracentesis was thoroughly discussed with the patient and questions answered. The benefits, risks, alternatives and complications were also discussed. The patient understands and wishes to proceed with the procedure. Written consent was obtained. Ultrasound was performed to localize and mark an adequate pocket of fluid in the right chest. The area was then prepped and draped in the normal sterile fashion. 1% Lidocaine was used for local anesthesia.  Under ultrasound guidance a 6 Fr Safe-T-Centesis catheter was introduced. Thoracentesis was performed. The catheter was removed and a dressing applied. FINDINGS: A total of approximately 900 mL of amber colored pleural fluid was removed. Samples were sent to the laboratory as requested by the clinical team. IMPRESSION: Successful  ultrasound guided right thoracentesis yielding 900 mL of pleural fluid. Electronically Signed   By: Jacqulynn Cadet M.D.   On: 09/11/2016 17:44    Anti-infectives: Anti-infectives    Start     Dose/Rate Route Frequency Ordered Stop   09/12/16 1100  vancomycin (VANCOCIN) 1,250 mg in sodium chloride 0.9 % 250 mL IVPB     1,250 mg 166.7 mL/hr over 90 Minutes Intravenous Every 12 hours 09/12/16 0102     09/12/16 0900  fluconazole (DIFLUCAN) IVPB 400 mg     400 mg 100 mL/hr over 120 Minutes Intravenous Every 24 hours 09/12/16 0841     09/11/16 1600  meropenem (MERREM) 1 g in sodium chloride 0.9 % 100 mL IVPB  Status:  Discontinued     1 g 200 mL/hr over 30 Minutes Intravenous Every 8 hours 09/11/16 1518 09/11/16 1524   09/11/16 1600  meropenem (MERREM) IVPB SOLR 1 g     1 g 100 mL/hr over 30 Minutes Intravenous Every 8 hours 09/11/16 1525     09/09/16 1600  vancomycin (VANCOCIN) 1,250 mg in sodium chloride 0.9 % 250 mL IVPB  Status:  Discontinued     1,250 mg 166.7 mL/hr over 90 Minutes Intravenous Every 18 hours 09/09/16 0912 09/12/16 0102   09/09/16 1000  vancomycin (VANCOCIN) IVPB 1000 mg/200 mL premix     1,000 mg 200 mL/hr over 60 Minutes Intravenous  Once 09/09/16 0912 09/09/16 1215   09/08/16 0600  vancomycin (VANCOCIN) IVPB 750 mg/150 ml premix  Status:  Discontinued     750 mg 150 mL/hr over 60 Minutes Intravenous Every 12 hours 09/07/16 2123 09/08/16 0922   09/07/16 2300  vancomycin (VANCOCIN) IVPB 1000 mg/200 mL premix     1,000 mg 200 mL/hr over 60 Minutes Intravenous  Once 09/07/16 2123 09/08/16 0017   08/20/2016 0900  piperacillin-tazobactam (ZOSYN)  IVPB 3.375 g  Status:  Discontinued     3.375 g 12.5 mL/hr over 240 Minutes Intravenous Every 8 hours 09/11/2016 0337 08/23/2016 0434   09/02/2016 0900  piperacillin-tazobactam (ZOSYN) IVPB 3.375 g  Status:  Discontinued     3.375 g 12.5 mL/hr over 240 Minutes Intravenous Every 8 hours 08/26/2016 0434 09/11/16 1517      Assessment/Plan:  1 - Left Ureteral Injury- remains stable from GU perspective. Foley draining clear urine. Do NOT discontinue any drains including foley without discussing with urology first. Continue all current drains/tubes until ileus completely resolved and tolerating PO well and out of ICU, then consider sequential removal. We will continue to follow.  2 - abdominal abscess/possible fistula - management per surgery.  Nicolette Bang 09/12/2016

## 2016-09-12 NOTE — Progress Notes (Signed)
Pt respiratory rate improved and O2 saturations improved on bipap, pt is diuresing from lasix in foley.  Resting comfortably.

## 2016-09-12 NOTE — Progress Notes (Signed)
Pt is alert and oriented, this morning pt was very restless and uncomfortable, even with position changes.  Pt dose of dilaudid changed from 0.5 mg q3h to 1 mg q3h as 0.5 mg dose was providing little relief to pt.  Pt is NSR/ST on telemetry, has very little appetite but did eat with encouragement from family.  Pt received one unit packed RBCs.  During end of transfusion, pt was having some respiratory distress, pt was still alert and oriented and all other vitals within normal limits.  Pt received 20 mg lasix and placed on bipap.  Pt diuresed around 750 ml urine, breathing comfortably on bipap and resting.  Left EJ PIV was occluded around 1500, therefore Na Phosphate and meropenem were unable to be given, as were waiting on double lumen PICC to be exchanged for triple lumen PICC.  [place] arrived around 01-05-29 to exhange PICC.  Passed on to [place], [place] receiving pt tonight that Na Phosphate and new bag of TPN need to be hung once new PICC placement verified with CXR.  Pt was weaned off Neosynephrine today, currently BP in 130's.  Order was placed for specialty bed as pt was very uncomfortable in standard bed and has been hospitalized since 08/26/2016.

## 2016-09-12 NOTE — Progress Notes (Signed)
Pt left EJ no longer patent.  Multiple RN's tried to gain peripheral IV access, but unsuccessful.  Pt only has double lumen PICC in place, TPN infusing in one port, Neosynephrine has been weaned off.  Pt to receive 1 unit blood with multiple other secondary infusions needed.  Spoke with Dr. Burt Knack regarding need for additional IV access, per Dr. Burt Knack may call [facility] to have them place/exchange current double lumen PICC for triple lumen PICC.

## 2016-09-12 NOTE — Progress Notes (Deleted)
Pt discharged home with brother.  Pt is alert and oriented, no complaints of pain.  SB on telemetry, lungs clear, was able to be titrated down to room air and maintain O2 sats 93-94%.  Pt also voided 450 ml post foley removal.  VSS, afebrile.  Neuro assessment within defined limits.  Iv's discontinued without incident.  All belongings returned to pt.  Discharge instructions and home medications reviewed with pt.  Pt states discharge instructions and has no questions.  Prescription for oxycodone given to pt.  D/c instructions contain no follow up appointment, per Dr. Trula Slade office will contact pt with follow up appointment.  Dr Trula Slade is aware of pt O2 sats 93-94% and ok with d/c home.

## 2016-09-12 NOTE — Progress Notes (Signed)
12 Days Post-Op  Subjective: This patient with multiple medical problems following a right colon resection as well as a repair of an injured ureter. Yesterday Dr. Heath Lark and I reviewed the patient's CT scan and it was suggested and I agreed that opening the wound to obtain drainage would possibly improve this patient's outcomes with her enlarged probable abscess cavity right below the wound. Dr. Heath Lark open wound and obtained 200-300 cc of pus which was cultured the Gram stain so far show gram variable rods and budding yeast.  Test with prime doc and with critical care medicine adding an antifungal which they agreed to do.  Today the patient is very frustrated and argumentative concerning her care and wants to come out of the ICU.  Objective: Vital signs in last 24 hours: Temp:  [98 F (36.7 C)-99.1 F (37.3 C)] 98 F (36.7 C) (12/30 0800) Pulse Rate:  [80-115] 92 (12/30 0900) Resp:  [15-37] 27 (12/30 0900) BP: (91-147)/(32-65) 119/49 (12/30 0900) SpO2:  [88 %-99 %] 95 % (12/30 0900) Last BM Date: 09/09/16  Intake/Output from previous day: 12/29 0701 - 12/30 0700 In: 2228.2 [I.V.:2028.2; IV Piggyback:200] Out: 2260 [Urine:1950; Drains:310] Intake/Output this shift: Total I/O In: 140 [I.V.:140] Out: -   Physical exam:  Abdominal wound shows some bilious staining but no obvious succus. The drain is in place and there is some drainage around the wound and the dressings otherwise the abdomen is soft and nontender Calves are nontender Considerable edema of the lower extremities and upper extremities and back.  Lab Results: CBC   Recent Labs  09/11/16 0613 09/12/16 0514  WBC 26.5* 23.0*  HGB 7.6* 6.6*  HCT 22.9* 20.6*  PLT 444* 518*   BMET  Recent Labs  09/11/16 0613 09/12/16 0818  NA 138 138  K 4.0 4.9  CL 101 104  CO2 33* 29  GLUCOSE 124* 138*  BUN 19 21*  CREATININE 0.46 0.54  CALCIUM 7.6* 7.8*   PT/INR No results for input(s): LABPROT, INR in the last  72 hours. ABG No results for input(s): PHART, HCO3 in the last 72 hours.  Invalid input(s): PCO2, PO2  Studies/Results: Dg Chest 1 View  Result Date: 09/11/2016 CLINICAL DATA:  82 year old female status post right-sided thoracentesis EXAM: CHEST 1 VIEW COMPARISON:  Prior chest x-ray 09/11/2016 FINDINGS: Significant interval reduction in right-sided pleural effusion. Improved expansion of the right lung with some residual atelectasis in the right base. No evidence of pneumothorax. Persistent small a moderate layering left pleural effusion with associated atelectasis. Unchanged cardiomegaly. Massive hiatal hernia. Right upper extremity PICC with the catheter tip overlying the superior cavoatrial junction. Atherosclerotic calcifications again noted in the transverse aorta. IMPRESSION: 1. No evidence of pneumothorax status post right-sided thoracentesis. 2. No significant residual right-sided pleural effusion. 3. Unchanged small to moderate layering left pleural effusion. Electronically Signed   By: Jacqulynn Cadet M.D.   On: 09/11/2016 17:43   Ct Abdomen Pelvis W Contrast  Result Date: 09/11/2016 CLINICAL DATA:  Patient status post ureteral reimplantation after transsection. Recent percutaneous drainage placement within the pelvis. EXAM: CT ABDOMEN AND PELVIS WITH CONTRAST TECHNIQUE: Multidetector CT imaging of the abdomen and pelvis was performed using the standard protocol following bolus administration of intravenous contrast. CONTRAST:  100 cc Isovue-300 COMPARISON:  CT abdomen pelvis 08/30/2016. FINDINGS: Lower chest: Normal heart size. Large hiatal hernia. Moderate layering bilateral pleural effusions. Consolidation within the lower lungs bilaterally. Hepatobiliary: Liver is normal in size and contour. No focal hepatic lesion is identified.  Layering sludge within the gallbladder lumen. Pancreas: Unremarkable Spleen: Unremarkable Adrenals/Urinary Tract: Normal adrenal glands. Kidneys enhance  symmetrically with contrast. Left-sided double-J nephroureteral stent is in place. Urinary bladder is mildly distended. There is mild left hydronephrosis. Stomach/Bowel: Large hiatal hernia. Postsurgical changes compatible with ascending colectomy. Gas and fluid collections extend adjacent to the anastomosis involving the colon (image 43; series 5). Anastomotic breakdown is not excluded. No evidence for bowel obstruction. Sigmoid colonic diverticulosis. No CT evidence for acute diverticulitis. Vascular/Lymphatic: Normal caliber abdominal aorta. Peripheral calcified atherosclerotic plaque. No retroperitoneal lymphadenopathy. Reproductive: Status post hysterectomy. Other: Interval placement of percutaneous drainage catheter terminating in the left hemipelvis. Near complete resolution of previously described pelvic fluid collection. There are persistent peripherally enhancing gas and fluid collections insinuated amongst the small bowel predominately within the right lower quadrant and anterior abdomen measuring up to 8.1 x 3.0 cm. This communicates more superiorly and anteriorly with a fluid and gas containing collection measuring 6.7 x 6.3 cm (image 61; series 2). Musculoskeletal: Anasarca. Lower thoracic and lumbar spine degenerative changes. No aggressive or acute appearing osseous lesions. IMPRESSION: Interval placement of drainage catheter terminating within the left hemipelvis with significant interval decrease in size of previously described pelvic fluid collection. Additionally within the central abdomen and right lower abdomen there is a large rim enhancing gas and fluid containing collection concerning for abscess. This fluid collection extends to the level of the anastomosis involving the colon. The possibility of anastomotic breakdown as a causative etiology is not excluded. Left-sided double-J nephroureteral stent in place. There is mild left hydronephrosis. Moderate bilateral layering pleural effusions  and underlying pulmonary consolidation. Large hiatal hernia. Anasarca. Aortic atherosclerosis. These results will be called to the ordering clinician or representative by the Radiologist Assistant, and communication documented in the PACS or zVision Dashboard. Electronically Signed   By: Lovey Newcomer M.D.   On: 09/11/2016 12:05   Dg Chest Port 1 View  Result Date: 09/11/2016 CLINICAL DATA:  Respiratory failure EXAM: PORTABLE CHEST 1 VIEW COMPARISON:  September 09, 2016 FINDINGS: Central catheter tip is in superior vena cava. No pneumothorax. There are bilateral pleural effusions with atelectatic change in both lung bases. There is cardiomegaly with pulmonary venous hypertension. No adenopathy evident. There is atherosclerotic calcification aorta. No bone lesions are evident. IMPRESSION: Evidence of congestive heart failure. Bibasilar atelectasis present. No pneumothorax. There is aortic atherosclerosis. In comparison with most recent study, right pleural effusion may be slightly larger. Electronically Signed   By: Lowella Grip III M.D.   On: 09/11/2016 07:04   US Thoracentesis Asp Pleural Space W/img Guide  Result Date: 09/11/2016 INDICATION: 82 year old female with symptomatic right pleural effusion EXAM: ULTRASOUND GUIDED RIGHT THORACENTESIS MEDICATIONS: None. COMPLICATIONS: None immediate. PROCEDURE: An ultrasound guided thoracentesis was thoroughly discussed with the patient and questions answered. The benefits, risks, alternatives and complications were also discussed. The patient understands and wishes to proceed with the procedure. Written consent was obtained. Ultrasound was performed to localize and mark an adequate pocket of fluid in the right chest. The area was then prepped and draped in the normal sterile fashion. 1% Lidocaine was used for local anesthesia. Under ultrasound guidance a 6 Fr Safe-T-Centesis catheter was introduced. Thoracentesis was performed. The catheter was removed and a  dressing applied. FINDINGS: A total of approximately 900 mL of amber colored pleural fluid was removed. Samples were sent to the laboratory as requested by the clinical team. IMPRESSION: Successful ultrasound guided right thoracentesis yielding 900 mL of pleural fluid. Electronically  Signed   By: Jacqulynn Cadet M.D.   On: 09/11/2016 17:44    Anti-infectives: Anti-infectives    Start     Dose/Rate Route Frequency Ordered Stop   09/12/16 1100  vancomycin (VANCOCIN) 1,250 mg in sodium chloride 0.9 % 250 mL IVPB     1,250 mg 166.7 mL/hr over 90 Minutes Intravenous Every 12 hours 09/12/16 0102     09/12/16 0900  fluconazole (DIFLUCAN) IVPB 400 mg     400 mg 100 mL/hr over 120 Minutes Intravenous Every 24 hours 09/12/16 0841     09/11/16 1600  meropenem (MERREM) 1 g in sodium chloride 0.9 % 100 mL IVPB  Status:  Discontinued     1 g 200 mL/hr over 30 Minutes Intravenous Every 8 hours 09/11/16 1518 09/11/16 1524   09/11/16 1600  meropenem (MERREM) IVPB SOLR 1 g     1 g 100 mL/hr over 30 Minutes Intravenous Every 8 hours 09/11/16 1525     09/09/16 1600  vancomycin (VANCOCIN) 1,250 mg in sodium chloride 0.9 % 250 mL IVPB  Status:  Discontinued     1,250 mg 166.7 mL/hr over 90 Minutes Intravenous Every 18 hours 09/09/16 0912 09/12/16 0102   09/09/16 1000  vancomycin (VANCOCIN) IVPB 1000 mg/200 mL premix     1,000 mg 200 mL/hr over 60 Minutes Intravenous  Once 09/09/16 0912 09/09/16 1215   09/08/16 0600  vancomycin (VANCOCIN) IVPB 750 mg/150 ml premix  Status:  Discontinued     750 mg 150 mL/hr over 60 Minutes Intravenous Every 12 hours 09/07/16 2123 09/08/16 0922   09/07/16 2300  vancomycin (VANCOCIN) IVPB 1000 mg/200 mL premix     1,000 mg 200 mL/hr over 60 Minutes Intravenous  Once 09/07/16 2123 09/08/16 0017   09/02/2016 0900  piperacillin-tazobactam (ZOSYN) IVPB 3.375 g  Status:  Discontinued     3.375 g 12.5 mL/hr over 240 Minutes Intravenous Every 8 hours 09/08/2016 0337 08/28/2016 0434    09/04/2016 0900  piperacillin-tazobactam (ZOSYN) IVPB 3.375 g  Status:  Discontinued     3.375 g 12.5 mL/hr over 240 Minutes Intravenous Every 8 hours 08/16/2016 0434 09/11/16 1517      Assessment/Plan: s/p Procedure(s): CYSTOSCOPY WITH bilateral RETROGRADE PYELOGRAM, diagnostic URETEROSCOPY, exploratory laparotomy with reimplantation/repair of left ureter EXPLORATORY LAPAROTOMY   Abdominal wound shows some bilious staining but no succus Patient had a thoracentesis as well. Addition of an antifungal for the drainage of pus from the midline abdominal wound was discussed with prime doc. Discussed with patient her frustrations and with family members who are understanding of the severity of this patient's condition. I do not believe a transfer out of the ICU makes any sense at this point as she required emergent transfer back to the ICU recently for respiratory failure. I believe with her CHF and bilateral pleural effusions she is at risk for pulmonary failure and transfer out of the ICU would be deleterious Continue nutritional and IV antibiotic support and now antifungal support. Observe wound for any sign of fistula.Florene Glen, MD, FACS  09/12/2016

## 2016-09-12 NOTE — Progress Notes (Signed)
Pt continues to have some assessory muscle use with periods of agonal respirations.  Dr. Mortimer Fries on floor, Dr. Mortimer Fries to come and see pt.  Order to place pt on bipap.

## 2016-09-12 NOTE — Progress Notes (Signed)
Pharmacy Antibiotic Note  Katherine Medina is a 82 y.o. female admitted on 08/16/2016 with pelvic abscess.  Pharmacy has been consulted for vancomycin dosing for possible HCAP. Patient with puss drained from abscess on 12/29. Per conversation with surgery, will discontinue Zosyn and change to meropenem. Wound culture sent on 12/29.   Plan: Initiate meropenem 1g IV q8hr.    Continue vancomycin 1250mg  IV Q18hr for goal trough of 15-20. Will obtain trough prior to 5th dose of vancomycin.    Height: 5\' 4"  (162.6 cm) Weight: 179 lb 10.8 oz (81.5 kg) IBW/kg (Calculated) : 54.7  Temp (24hrs), Avg:98.7 F (37.1 C), Min:98.5 F (36.9 C), Max:99.1 F (37.3 C)   Recent Labs Lab 09/07/16 0648 09/07/16 1346 09/08/16 0311 09/09/16 0350 09/10/16 0424 09/11/16 0613 09/11/16 2136  WBC SPECIMEN CONTAMINATED, UNABLE TO PERFORM TEST(S). 20.1* 19.0* 18.5* 19.0* 26.5*  --   CREATININE 0.37*  --  0.55 0.60 0.54 0.46  --   VANCOTROUGH  --   --   --   --   --   --  12*    Estimated Creatinine Clearance: 53.1 mL/min (by C-G formula based on SCr of 0.46 mg/dL).    No Known Allergies  Antimicrobials this admission: Zosyn 12/18 >> 12/29 Vancomycin 12/27 >>  Meropenem 12/29 >>   Dose adjustments this admission: 12/29 PM vanc level 12. Changed to 1250 mg q 12 hours. Level before 4th new dose.  Microbiology results: 12/18 MRSA PCR: negative 12/25 Body Fluid Cx: no growth x 4 days  12/26 Sputum: moderate yeast  12/27 BCx: no growth x 2 days  12/29 WoundCx: sent  Pharmacy will continue to monitor and adjust per consult.   Cristal Howatt S 09/12/2016 1:04 AM

## 2016-09-13 DIAGNOSIS — T814XXA Infection following a procedure, initial encounter: Principal | ICD-10-CM

## 2016-09-13 DIAGNOSIS — S3710XA Unspecified injury of ureter, initial encounter: Secondary | ICD-10-CM

## 2016-09-13 LAB — BLOOD GAS, ARTERIAL
Acid-Base Excess: 11.2 mmol/L — ABNORMAL HIGH (ref 0.0–2.0)
BICARBONATE: 37.9 mmol/L — AB (ref 20.0–28.0)
Delivery systems: POSITIVE
EXPIRATORY PAP: 4
FIO2: 0.35
INSPIRATORY PAP: 14
Mechanical Rate: 10
O2 SAT: 97.4 %
PCO2 ART: 64 mmHg — AB (ref 32.0–48.0)
PO2 ART: 97 mmHg (ref 83.0–108.0)
Patient temperature: 37
pH, Arterial: 7.38 (ref 7.350–7.450)

## 2016-09-13 LAB — GLUCOSE, CAPILLARY
GLUCOSE-CAPILLARY: 158 mg/dL — AB (ref 65–99)
GLUCOSE-CAPILLARY: 211 mg/dL — AB (ref 65–99)
Glucose-Capillary: 154 mg/dL — ABNORMAL HIGH (ref 65–99)
Glucose-Capillary: 140 mg/dL — ABNORMAL HIGH (ref 65–99)

## 2016-09-13 LAB — CBC
HEMATOCRIT: 24.6 % — AB (ref 35.0–47.0)
HEMOGLOBIN: 7.7 g/dL — AB (ref 12.0–16.0)
MCH: 27.5 pg (ref 26.0–34.0)
MCHC: 31.4 g/dL — AB (ref 32.0–36.0)
MCV: 87.7 fL (ref 80.0–100.0)
Platelets: 438 10*3/uL (ref 150–440)
RBC: 2.81 MIL/uL — ABNORMAL LOW (ref 3.80–5.20)
RDW: 15.1 % — AB (ref 11.5–14.5)
WBC: 19.2 10*3/uL — AB (ref 3.6–11.0)

## 2016-09-13 LAB — PHOSPHORUS: PHOSPHORUS: 3.3 mg/dL (ref 2.5–4.6)

## 2016-09-13 LAB — MAGNESIUM
MAGNESIUM: 2.2 mg/dL (ref 1.7–2.4)
Magnesium: 2.1 mg/dL (ref 1.7–2.4)

## 2016-09-13 LAB — BASIC METABOLIC PANEL
ANION GAP: 3 — AB (ref 5–15)
ANION GAP: 2 — AB (ref 5–15)
BUN: 21 mg/dL — AB (ref 6–20)
BUN: 22 mg/dL — AB (ref 6–20)
CALCIUM: 7.8 mg/dL — AB (ref 8.9–10.3)
CHLORIDE: 101 mmol/L (ref 101–111)
CO2: 36 mmol/L — ABNORMAL HIGH (ref 22–32)
CO2: 37 mmol/L — ABNORMAL HIGH (ref 22–32)
Calcium: 7.8 mg/dL — ABNORMAL LOW (ref 8.9–10.3)
Chloride: 98 mmol/L — ABNORMAL LOW (ref 101–111)
Creatinine, Ser: 0.5 mg/dL (ref 0.44–1.00)
Creatinine, Ser: 0.47 mg/dL (ref 0.44–1.00)
GFR calc Af Amer: >60 mL/min (ref >60)
GFR calc Af Amer: >60 mL/min (ref >60)
GFR calc non Af Amer: >60 mL/min (ref >60)
GLUCOSE: 144 mg/dL — AB (ref 65–99)
Glucose, Bld: 222 mg/dL — ABNORMAL HIGH (ref 65–99)
POTASSIUM: 4.2 mmol/L (ref 3.5–5.1)
POTASSIUM: 4.9 mmol/L (ref 3.5–5.1)
SODIUM: 137 mmol/L (ref 135–145)
Sodium: 140 mmol/L (ref 135–145)

## 2016-09-13 LAB — TYPE AND SCREEN
Blood Product Expiration Date: 201801062359
ISSUE DATE / TIME: 201712301538
Unit Type and Rh: 9500

## 2016-09-13 MED ORDER — TRACE MINERALS CR-CU-MN-SE-ZN 10-1000-500-60 MCG/ML IV SOLN
INTRAVENOUS | Status: DC
  Administered 2016-09-13: 17:00:00 via INTRAVENOUS
  Filled 2016-09-13: qty 1680

## 2016-09-13 MED ORDER — NALOXONE HCL 0.4 MG/ML IJ SOLN
INTRAMUSCULAR | Status: AC
  Administered 2016-09-13: 0.4 mg
  Filled 2016-09-13: qty 1

## 2016-09-13 MED ORDER — LORAZEPAM 2 MG/ML IJ SOLN: 0.5000 mg | INTRAMUSCULAR | Status: DC | PRN

## 2016-09-13 MED ORDER — FUROSEMIDE 10 MG/ML IJ SOLN
60.0000 mg | Freq: Once | INTRAMUSCULAR | Status: AC
  Administered 2016-09-13: 60 mg via INTRAVENOUS
  Filled 2016-09-13: qty 6

## 2016-09-13 MED ORDER — OXYCODONE HCL 5 MG PO TABS
5.0000 mg | ORAL_TABLET | Freq: Four times a day (QID) | ORAL | Status: DC | PRN
  Administered 2016-09-13: 5 mg via ORAL
  Filled 2016-09-13: qty 1

## 2016-09-13 MED ORDER — HYDROMORPHONE HCL 1 MG/ML IJ SOLN
1.0000 mg | INTRAMUSCULAR | Status: DC | PRN
  Administered 2016-09-13: 1 mg via INTRAVENOUS
  Filled 2016-09-13: qty 1

## 2016-09-13 MED ORDER — DILTIAZEM HCL 100 MG IV SOLR
2.5000 mg/h | INTRAVENOUS | Status: DC
  Administered 2016-09-13: 5 mg/h via INTRAVENOUS
  Filled 2016-09-13: qty 100

## 2016-09-14 LAB — CULTURE, BLOOD (ROUTINE X 2)
Culture: NO GROWTH
Culture: NO GROWTH

## 2016-09-14 NOTE — Progress Notes (Signed)
Patients saturation dropped into the 80's once again and RN increased FIO2 first to 60% then to 100% due to sats not wanting to go into the 90's. On 100% her sats were 91% when RT entered room. EPAP was increased to 6cmH2O to assist with better oxygenation. Sats were 93% when RT left room, RN aware EPAP changes.

## 2016-09-14 NOTE — Progress Notes (Signed)
[place] Progress Note Patient Name: Katherine Medina DOB: 28-Apr-1929 MRN: [id number]   Date of Service  Oct 11, 2016  HPI/Events of Note  AFIB with RVR - ventricular rate 160's to 190's. BP = 127/65 (MAP = 75).  'eICU Interventions  Will order: 1. Cardizem IV infusion. Titrate to HR = 65-105. 2. BMP and Mg++ level STAT.     Intervention Category Major Interventions: Arrhythmia - evaluation and management  Diyan Dave Eugene October 11, 2016, 4:40 PM

## 2016-09-14 NOTE — Progress Notes (Signed)
Pt's HR stopped at 1810, this was verified via ausculation by myself and Durene Fruits, RN. Dr. Oletta Darter notified. Family at bedside at time of passing. [facility] notified, pt is not a candidate for organ or tissue donor at this time due to age. She has been released by [facility], nursing supervisor notified. [place] [facility] [phone].

## 2016-09-14 NOTE — Progress Notes (Signed)
Pt remains lethargic and responsive to verbal stimuli. She falls back to sleep quickly and follows some commands, but not all. I held her metoprolol and ensure at this time as she is on bipap and her ability to safely swallow is questionable at this time. Dr. Mortimer Fries updated. Pt's HR is 98 and BP is 120/59 at this time.

## 2016-09-14 NOTE — Discharge Summary (Signed)
Patient ID: Katherine Medina MRN: [id number] DOB/AGE: 81-Jan-1930 81 y.o.  Admit date: 08/29/2016 Discharge date: 10/11/2016   Discharge Diagnoses:  Active Problems:   Pelvic abscess in female   Acute respiratory failure (HCC)   Dyspnea   Ileus (HCC)   Pleural effusion   Postprocedural intraabdominal abscess   Recurrent right pleural effusion   Ureter injury   Procedures: Laparotomy and Left ureteral re implantation w Double J stens ( Dr. Linzie Collin)   Hospital Course:  Mrs. 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   She initially underwent a combined procedure in the form of a Laparoscopic total Hysterectomy with BSO and Hand assisted laparoscopic Right hemicolectomy. During her initial hospitalization she did have urinary retention and needed a Foley catheter as well as a an ileus that resolved. She eventually was discharged home and couple days later came back to emergency room and septic shockand with abdominal pain. Further workup included a CT scan of the abdomen and pelvis  Showing a large pelvic collection. She was admitted to the ICU and appropriately resuscitated. We asked interventional radiology to place a percutaneous drain and the drain revealed a large collection of urine. At That time urology was consulted and they proceeded with further workup including cystoscopy and cystogram. During the cystogram there was evidence of a left ureteral transection. The injury was on the distal left ureter near the trigone, it was evident that the injury was the result of the hysterectomy portion of the recent procedure.  Decision was made to proceed with laparotomy and reimplantation of the left ureter into the dome of the bladder. This was done uneventfully but there was  significant inflammatory response in the pelvis with some purulent material and Dr. Marlou Porch and I were able to perform the operation and place a JP drain . She did well initially and was transferred to the ICU for further antibiotic therapy and further resuscitation. I am she was transferred to the floor where her diet was advanced again developed an ileus requiring NG tube and nothing by mouth. She required TPN and PICC line and continuation of prolonged antibiotics due to the sepsis. Katherine Medina developed respiratory distress due to volume overload. Further w/u revealed significant Left pleural effusion and pulmonary congestion. She is transferred to the ICU for further care and for aggressive diuresis. Pulmonary perform a thoracentesis to help with her respiratory status. She improved from her respiratory failure for a few days but that her overall condition is started to deteriorate. Her respiratory failure become more evident with hypoxia as well as a persistent increase in her white count. We perform a CT scan of the abdomen and pelvis to elucidate any evidence of intra-abdominal sepsis. There was an abscess in the midline underneath the fascia. Dr. Orvis Brill open up her previous incision and  drained approximately 300 cc of pus. The cultures showed fungi as well as a mixed flora. Her antibiotics were changed as well as her spectrum was broadened with antifungal and vancomycin. Her overall condition continued to deteriorate  with severe anasarca and severe malnutrition due to all her injuries. Her JP started putting out bilious content indicative of a fistula. On December 31 she again developed more respiratory failure requiring BiPAP and develop A. fib with rapid ventricular response. She  progressively became less responsive and the family did not want to proceed with aggressive measures  for resuscitation (in the form of intubation of CPR). She was made comfortable, was given narcotics and the BiPAP was  removed. Unfortunately I was not present when the patient died but I did have a conversation with the nurse who  stated that at that time the family members and Dr. Leonides Schanz were both at the bedside and she was able to pass away in a Peaceful manner. I did speak to Katherine Medina over the phone and gave him my sincerest  Condolences.   Consults:  Urology Critical Care   Disposition: 20-Expired      Caroleen Hamman, MD FACS

## 2016-09-14 NOTE — Progress Notes (Signed)
Pt's HR increased to the 180s-190s and is now in atrial fibrillation. E-Link paged, and per Dr. Oletta Darter will start pt on a cardizem drip and collect a BMP and magnesium level.

## 2016-09-14 NOTE — Progress Notes (Signed)
[place] NOTE   Pharmacy Consult for TPN  Indication: Prolonged ileus    Patient Measurements: Height: 5\' 4"  (162.6 cm) Weight: 176 lb (79.8 kg) IBW/kg (Calculated) : 54.7 TPN AdjBW (KG): 59.7 Body mass index is 30.21 kg/m.   Assessment: Pharmacy consulted to assist in the management of electrolytes and glucose in this  83 year old woman receiving TPN for postop ileus.  Insulin requirements in the past 24 hours: 14 units    Best Practices: TPN Access: TPN start date: 12/21  Nutritional Goals (per RD recommendation on 2223 calories ):  Current Nutrition: NPO, TPN  Plan:  Continue Clinimix E 5/20 @ 64mL/hr with added MVI, trace and Thiamine 100mg  per bag. Will continue Q4h SSI. Will continue 20% lipid emulsion to infuse over 12 hours daily.   Electrolytes WNL.  Patient to start comfort care measures tomorrow morning.   Pharmacy will continue to monitor and adjust per consult.   Olivia Canter, RPh Clinical Pharmacist 09/26/2016,1:18 PM

## 2016-09-14 NOTE — Progress Notes (Signed)
I have discussed case with Husband and daughters, patient is suffering and struggling to breathe and in pain, and they have agreed and stated that she has suffered enough. They have all consented and agreed to continue DNR/DNI status and to start Comfort care measures tomorrow Morning    Corrin Parker, M.D.  [facility]  Medical Director [place] Director [facility] Cardio-Pulmonary Department

## 2016-09-14 NOTE — Progress Notes (Signed)
RN increased FIO2 to 40% due to sats in the 80's. Sats at this time are 94%. Will continue to monitor.

## 2016-09-14 NOTE — Progress Notes (Signed)
CH responded to OR for end of life. When [facility] arrived, PT had just passed. CH provided prayer for family members in the room. Family was grateful but also ready to leave. CH did not stay long.

## 2016-09-14 NOTE — Progress Notes (Signed)
Pt's pupils are dilated and sluggish to respond to light. She is lethargic, but responds to touch. She appears oriented to person, but not to place, time, or situation. She does follow some commands such as gripping hands. Pt has recently received IV ativan and IV dilaudid. Dr. Mortimer Fries notified. No new orders at this time.

## 2016-09-14 NOTE — Progress Notes (Signed)
[place] [facility] [place] NAME: Katherine Medina    MR#:  [id number]  DATE OF BIRTH:  12-25-1928  SUBJECTIVE:  CHIEF COMPLAINT:   Chief Complaint  Patient presents with  . Post-op Problem    Transfer from [facility]  Patient had drainage of the new abscess  09/11/2016 by surgicalist , JP drain in place Patient was short of breath with shallow breathing and was placed on BiPAP. Dilaudid was given for pain and patient is very lethargic during my examination. No family members at bedside  REVIEW OF SYSTEMS:  Review of Systems  Unable to perform ROS: Mental status change  Constitutional: Positive for malaise/fatigue. Negative for chills, fever and weight loss.  HENT: Negative for nosebleeds and sore throat.   Eyes: Negative for blurred vision.  Respiratory: Positive for shortness of breath. Negative for cough and wheezing.   Cardiovascular: Negative for chest pain, orthopnea, leg swelling and PND.  Gastrointestinal: Positive for abdominal pain. Negative for constipation, diarrhea, heartburn, nausea and vomiting.  Genitourinary: Negative for dysuria and urgency.  Musculoskeletal: Negative for back pain.  Skin: Negative for rash.  Neurological: Positive for weakness. Negative for dizziness, speech change, focal weakness and headaches.  Endo/Heme/Allergies: Does not bruise/bleed easily.  Psychiatric/Behavioral: Negative for depression.   DRUG ALLERGIES:  No Known Allergies VITALS:  Blood pressure (!) 153/77, pulse (!) 115, temperature 98.7 F (37.1 C), temperature source Axillary, resp. rate 19, height 5\' 4"  (1.626 m), weight 79.8 kg (176 lb), SpO2 97 %. PHYSICAL EXAMINATION:  Physical Exam  Constitutional: She appears lethargic and malnourished. She appears healthy. She has a sickly appearance.  HENT:  Head: Normocephalic and atraumatic.  Eyes: Conjunctivae and EOM are normal. Pupils are equal, round, and reactive to light.  Neck: Normal range of  motion. Neck supple. No tracheal deviation present. No thyromegaly present.  Cardiovascular: Normal rate, regular rhythm and normal heart sounds.   Pulmonary/Chest: She has decreased breath sounds. She has no wheezes. She exhibits no tenderness.  Abdominal: Soft. Bowel sounds are normal. She exhibits no distension. There is generalized tenderness.  right abdominal jp drain intact draining serosanguinous fluid   Musculoskeletal: Normal range of motion.  Neurological: She appears lethargic. No cranial nerve deficit.  Skin: Skin is warm and dry. Rash noted. Rash is maculopapular.     Psychiatric: Mood and affect normal.   LABORATORY PANEL:   CBC  Recent Labs Lab 09/15/2016 0542  WBC 19.2*  HGB 7.7*  HCT 24.6*  PLT 438   ------------------------------------------------------------------------------------------------------------------ Chemistries   Recent Labs Lab 09/10/16 0424  2016/09/15 0643  NA 138  < > 140  K 3.6  < > 4.2  CL 102  < > 101  CO2 33*  < > 36*  GLUCOSE 139*  < > 144*  BUN 17  < > 21*  CREATININE 0.54  < > 0.50  CALCIUM 7.4*  < > 7.8*  MG 1.9  < > 2.1  AST 30  --   --   ALT 17  --   --   ALKPHOS 92  --   --   BILITOT 0.2*  --   --   < > = values in this interval not displayed. RADIOLOGY:  Dg Chest Port 1 View  Result Date: 09/12/2016 CLINICAL DATA:  PICC placement. EXAM: PORTABLE CHEST 1 VIEW COMPARISON:  Radiograph of September 11, 2016. FINDINGS: Stable cardiomegaly. Atherosclerosis of thoracic aorta is noted. Stable large hiatal hernia. Stable bilateral perihilar  and basilar opacities are noted concerning for edema. Stable mild left pleural effusion is noted. No pneumothorax is noted. Right-sided PICC line is unchanged in position, with distal tip in expected position of cavoatrial junction. IMPRESSION: Stable position of right-sided PICC line. Aortic atherosclerosis. Stable large hiatal hernia. Stable bilateral lung opacities as described above.  Electronically Signed   By: Marijo Conception, M.D.   On: 09/12/2016 19:59   ASSESSMENT AND PLAN:   * Acute hypoxic respiratory failure secondary to bilateral pleural effusions and atelectasis , possible aspiration s/p hemicolectomy and hysterectomy with injuries to urinary tract-multifactorial -Currently on BiPAP, continue nebulizer treatments and antibiotics -CODE STATUS has been changed from full code to DO NOT RESUSCITATE DO NOT INTUBATE - s/p Lt thora on 12/25 and still has pleural effusions on the right side.Follow up with pulmonology - continue Vancomycin and Meropenem for possible HAP  * Acute on chronic anemia - Hb 7.9 -> 7.2->7.6-->6.6  s/p 1 unit of blood transfusion 09-16-2016. Hemoglobin at 7.7 today  * Intra-abdominal abscess status post incision and drainage-JP drain placed by surgery Continue current IV antibiotics vancomycin and meropenem. Consider ID consult  * Hospital Acquired PNA- possible - Vanco + Meropenem  * Hypokalemia - repleted and resolved   * Surgery for hemicolectomy with injuries to urinary tract and urologic surgery. Now with the enterocutaneous fistula, follow-up with surgery-primary care    Continue management as per primary team .   continue Zosyn.Vancomycin -Abdominal wound infection with budding yeast fluconazole added to the regimen  * Severe protein-calorie malnutrition with anasarca from third spacing   Albumin is 1.3, patient is on TPN.   Continue monitoring.  * Leukocytosis: 10 ->18.5->19->26.5--> 23.5--19.2> - monitor while on Abx (vanco + Meropenem) - Consider Infectious disease consultation   She is looking critically ill . Poor prognosis . Scheduled family meeting at 74 AM to discuss about goals of care   All the records are reviewed and case discussed with Care Management/Social Worker. Management plans discussed with Dr. Mortimer Fries pulmonologist and RN CODE STATUS: DO NOT RESUSCITATE-healthcare power of attorney husband, 2  daughters Grase and Barbara  TOTAL TIME (critical care) TAKING CARE OF THIS PATIENT: 33 minutes.     Nicholes Mango M.D on 09-16-2016 at 8:25 AM  Between 7am to 6pm - Pager - [phone]  After 6pm go to [email or web address] - Proofreader  [facility]  Office  [phone]  CC: Primary care physician; Madelyn Brunner, MD  Note: This dictation was prepared with Dragon dictation along with smaller phrase technology. Any transcriptional errors that result from this process are unintentional.

## 2016-09-14 NOTE — Progress Notes (Signed)
   I spoke to the patient's husband and explained to him that patient is not breathing effectively at this time and need to be intubated.  Husband does not want the patient to be intubated or receive any chest compressions in the event of cardiac arrest.  Patient was made a DNR at this time.   [facility]

## 2016-09-14 NOTE — Progress Notes (Signed)
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 Patient's breathing is shallow unlabored and has been made a DO NOT RESUSCITATE. He is confused  Objective: Vital signs in last 24 hours: Temp:  [98 F (36.7 C)-98.5 F (36.9 C)] 98.5 F (36.9 C) (12/31 0400) Pulse Rate:  [82-132] 111 (12/31 0700) Resp:  [14-39] 25 (12/31 0700) BP: (117-156)/(48-74) 144/63 (12/31 0600) SpO2:  [86 %-99 %] 93 % (12/31 0700) FiO2 (%):  [35 %] 35 % (12/31 0354) Weight:  [176 lb (79.8 kg)] 176 lb (79.8 kg) (12/31 0450) Last BM Date: 09/09/16  Intake/Output from previous day: 12/30 0701 - 12/31 0700 In: 1000 [I.V.:700; Blood:300] Out: 2655 [Urine:2300; Drains:355] Intake/Output this shift: No intake/output data recorded.  Physical exam:  Rhonchi bilaterally Confused Abdomen is soft nontender clearly there is succus in the midline wound now and bilious material in the JP drain.  Lab Results: CBC   Recent Labs  09/12/16 0514 2016-09-16 0542  WBC 23.0* 19.2*  HGB 6.6* 7.7*  HCT 20.6* 24.6*  PLT 518* 438   BMET  Recent Labs  09/12/16 0818 09/16/16 0643  NA 138 140  K 4.9 4.2  CL 104 101  CO2 29 36*  GLUCOSE 138* 144*  BUN 21* 21*  CREATININE 0.54 0.50  CALCIUM 7.8* 7.8*   PT/INR No results for input(s): LABPROT, INR in the last 72 hours. ABG  Recent Labs  09/16/2016 0221  PHART 7.38  HCO3 37.9*    Studies/Results: Dg Chest 1 View  Result Date: 09/11/2016 CLINICAL DATA:  83 year old female status post right-sided thoracentesis EXAM: CHEST 1 VIEW COMPARISON:  Prior chest x-ray 09/11/2016 FINDINGS: Significant interval reduction in right-sided pleural effusion. Improved expansion of the right lung with some residual atelectasis in the right base. No evidence of pneumothorax. Persistent small a moderate layering left pleural effusion with associated atelectasis. Unchanged cardiomegaly. Massive hiatal hernia. Right upper  extremity PICC with the catheter tip overlying the superior cavoatrial junction. Atherosclerotic calcifications again noted in the transverse aorta. IMPRESSION: 1. No evidence of pneumothorax status post right-sided thoracentesis. 2. No significant residual right-sided pleural effusion. 3. Unchanged small to moderate layering left pleural effusion. Electronically Signed   By: Jacqulynn Cadet M.D.   On: 09/11/2016 17:43   Ct Abdomen Pelvis W Contrast  Result Date: 09/11/2016 CLINICAL DATA:  Patient status post ureteral reimplantation after transsection. Recent percutaneous drainage placement within the pelvis. EXAM: CT ABDOMEN AND PELVIS WITH CONTRAST TECHNIQUE: Multidetector CT imaging of the abdomen and pelvis was performed using the standard protocol following bolus administration of intravenous contrast. CONTRAST:  100 cc Isovue-300 COMPARISON:  CT abdomen pelvis 08/30/2016. FINDINGS: Lower chest: Normal heart size. Large hiatal hernia. Moderate layering bilateral pleural effusions. Consolidation within the lower lungs bilaterally. Hepatobiliary: Liver is normal in size and contour. No focal hepatic lesion is identified. Layering sludge within the gallbladder lumen. Pancreas: Unremarkable Spleen: Unremarkable Adrenals/Urinary Tract: Normal adrenal glands. Kidneys enhance symmetrically with contrast. Left-sided double-J nephroureteral stent is in place. Urinary bladder is mildly distended. There is mild left hydronephrosis. Stomach/Bowel: Large hiatal hernia. Postsurgical changes compatible with ascending colectomy. Gas and fluid collections extend adjacent to the anastomosis involving the colon (image 43; series 5). Anastomotic breakdown is not excluded. No evidence for bowel obstruction. Sigmoid colonic diverticulosis. No CT evidence for acute diverticulitis. Vascular/Lymphatic: Normal caliber abdominal aorta. Peripheral calcified atherosclerotic plaque. No retroperitoneal lymphadenopathy. Reproductive:  Status post hysterectomy. Other: Interval placement of percutaneous drainage catheter  terminating in the left hemipelvis. Near complete resolution of previously described pelvic fluid collection. There are persistent peripherally enhancing gas and fluid collections insinuated amongst the small bowel predominately within the right lower quadrant and anterior abdomen measuring up to 8.1 x 3.0 cm. This communicates more superiorly and anteriorly with a fluid and gas containing collection measuring 6.7 x 6.3 cm (image 61; series 2). Musculoskeletal: Anasarca. Lower thoracic and lumbar spine degenerative changes. No aggressive or acute appearing osseous lesions. IMPRESSION: Interval placement of drainage catheter terminating within the left hemipelvis with significant interval decrease in size of previously described pelvic fluid collection. Additionally within the central abdomen and right lower abdomen there is a large rim enhancing gas and fluid containing collection concerning for abscess. This fluid collection extends to the level of the anastomosis involving the colon. The possibility of anastomotic breakdown as a causative etiology is not excluded. Left-sided double-J nephroureteral stent in place. There is mild left hydronephrosis. Moderate bilateral layering pleural effusions and underlying pulmonary consolidation. Large hiatal hernia. Anasarca. Aortic atherosclerosis. These results will be called to the ordering clinician or representative by the Radiologist Assistant, and communication documented in the PACS or zVision Dashboard. Electronically Signed   By: Lovey Newcomer M.D.   On: 09/11/2016 12:05   Dg Chest Port 1 View  Result Date: 09/12/2016 CLINICAL DATA:  PICC placement. EXAM: PORTABLE CHEST 1 VIEW COMPARISON:  Radiograph of September 11, 2016. FINDINGS: Stable cardiomegaly. Atherosclerosis of thoracic aorta is noted. Stable large hiatal hernia. Stable bilateral perihilar and basilar opacities are  noted concerning for edema. Stable mild left pleural effusion is noted. No pneumothorax is noted. Right-sided PICC line is unchanged in position, with distal tip in expected position of cavoatrial junction. IMPRESSION: Stable position of right-sided PICC line. Aortic atherosclerosis. Stable large hiatal hernia. Stable bilateral lung opacities as described above. Electronically Signed   By: Marijo Conception, M.D.   On: 09/12/2016 19:59   US Thoracentesis Asp Pleural Space W/img Guide  Result Date: 09/11/2016 INDICATION: 83 year old female with symptomatic right pleural effusion EXAM: ULTRASOUND GUIDED RIGHT THORACENTESIS MEDICATIONS: None. COMPLICATIONS: None immediate. PROCEDURE: An ultrasound guided thoracentesis was thoroughly discussed with the patient and questions answered. The benefits, risks, alternatives and complications were also discussed. The patient understands and wishes to proceed with the procedure. Written consent was obtained. Ultrasound was performed to localize and mark an adequate pocket of fluid in the right chest. The area was then prepped and draped in the normal sterile fashion. 1% Lidocaine was used for local anesthesia. Under ultrasound guidance a 6 Fr Safe-T-Centesis catheter was introduced. Thoracentesis was performed. The catheter was removed and a dressing applied. FINDINGS: A total of approximately 900 mL of amber colored pleural fluid was removed. Samples were sent to the laboratory as requested by the clinical team. IMPRESSION: Successful ultrasound guided right thoracentesis yielding 900 mL of pleural fluid. Electronically Signed   By: Jacqulynn Cadet M.D.   On: 09/11/2016 17:44    Anti-infectives: Anti-infectives    Start     Dose/Rate Route Frequency Ordered Stop   09/12/16 1100  vancomycin (VANCOCIN) 1,250 mg in sodium chloride 0.9 % 250 mL IVPB     1,250 mg 166.7 mL/hr over 90 Minutes Intravenous Every 12 hours 09/12/16 0102     09/12/16 0900  fluconazole  (DIFLUCAN) IVPB 400 mg     400 mg 100 mL/hr over 120 Minutes Intravenous Every 24 hours 09/12/16 0841     09/11/16 1600  meropenem (MERREM) 1  g in sodium chloride 0.9 % 100 mL IVPB  Status:  Discontinued     1 g 200 mL/hr over 30 Minutes Intravenous Every 8 hours 09/11/16 1518 09/11/16 1524   09/11/16 1600  meropenem (MERREM) IVPB SOLR 1 g     1 g 100 mL/hr over 30 Minutes Intravenous Every 8 hours 09/11/16 1525     09/09/16 1600  vancomycin (VANCOCIN) 1,250 mg in sodium chloride 0.9 % 250 mL IVPB  Status:  Discontinued     1,250 mg 166.7 mL/hr over 90 Minutes Intravenous Every 18 hours 09/09/16 0912 09/12/16 0102   09/09/16 1000  vancomycin (VANCOCIN) IVPB 1000 mg/200 mL premix     1,000 mg 200 mL/hr over 60 Minutes Intravenous  Once 09/09/16 0912 09/09/16 1215   09/08/16 0600  vancomycin (VANCOCIN) IVPB 750 mg/150 ml premix  Status:  Discontinued     750 mg 150 mL/hr over 60 Minutes Intravenous Every 12 hours 09/07/16 2123 09/08/16 0922   09/07/16 2300  vancomycin (VANCOCIN) IVPB 1000 mg/200 mL premix     1,000 mg 200 mL/hr over 60 Minutes Intravenous  Once 09/07/16 2123 09/08/16 0017   08/28/2016 0900  piperacillin-tazobactam (ZOSYN) IVPB 3.375 g  Status:  Discontinued     3.375 g 12.5 mL/hr over 240 Minutes Intravenous Every 8 hours 08/16/2016 0337 08/20/2016 0434   09/11/2016 0900  piperacillin-tazobactam (ZOSYN) IVPB 3.375 g  Status:  Discontinued     3.375 g 12.5 mL/hr over 240 Minutes Intravenous Every 8 hours 08/19/2016 0434 09/11/16 1517      Assessment/Plan: s/p Procedure(s): CYSTOSCOPY WITH bilateral RETROGRADE PYELOGRAM, diagnostic URETEROSCOPY, exploratory laparotomy with reimplantation/repair of left ureter EXPLORATORY LAPAROTOMY   H&H is reviewed. As his white blood cell count.  Patient's been made a DO NOT RESUSCITATE and is critically ill. Her mentation is less than it was yesterday and her respiratory status is worsening. 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 Yesterday that bile staining was non-particulate but today it has more particulate material. Prognosis is very poor at this point. No family present this morning  Florene Glen, MD, FACS  September 28, 2016

## 2016-09-14 NOTE — Progress Notes (Signed)
PT Cancellation Note  Patient Details Name: KIERRE SEENEY MRN: [id number] DOB: [birth year]   Cancelled Treatment:    Reason Eval/Treat Not Completed: Other (comment) (See note). PT spoke to RN at 1530 to inquire if treatment should be performed after chart review, seeing transfer to comfort care. Recommended holding at this time.   Dorice Lamas, PT, DPT Oct 03, 2016, 3:30 PM

## 2016-09-14 NOTE — Progress Notes (Signed)
Pt's oxygen saturations began to drop down to 87% while on bipap at 35% O2. Bilateral lung sounds are rhonchorous and she has 2-3+ generalized pitting edema. Pt exhibits a very weak cough on command, is unable to produce any sputum when asked to cough, and remains lethargic. I called RT and discussed increasing her oxygen percentage via bipap. RT suggested that I increase the oxygen percentage and that she would round on pt shortly. I increased pt to 40% oxygen via the bipap, and pt's O2 saturations increased only to 89%. I then increased her oxygen to 45%, the pt's oxygen saturations increased to 93%. RT came in to see patient and started her breathing treatment. Pt's lung sounds remain rhonchorous after receiving her breathing treatment. Dr. Mortimer Fries notified about pt's condition. Per Dr. Mortimer Fries, will give pt a one-time dose of 60mg  IV lasix and change frequency of PRN dilaudid.

## 2016-09-14 NOTE — Progress Notes (Addendum)
Very weak. +resp distress. Now obtunded received painmeds. Anorexia persists.  Rattling cough Family made patient DNR/DNI  12/30 CT abd shows another abscess s/p drainage by Gen surgery JP drain placed-had nausea and increased WOB with manipulation Vaginal rash and pain-foley removed, but Urology wants it re-inserted   Overall prognosis is poor, will need to update family today  Vitals:   2016/10/07 0450 2016/10/07 0500 2016/10/07 0600 10-07-2016 0700  BP:  (!) 127/58 (!) 144/63   Pulse:  (!) 107 (!) 107 (!) 111  Resp:  (!) 26 16 (!) 25  Temp:      TempSrc:      SpO2:  98% 95% 93%  Weight: 176 lb (79.8 kg)     Height:        HEENT WNL No JVD Dull with diminished BS qpprox 1/3 up on R, crackles in L base Tachy, reg, no M Abd slightly firm, mildly tender, absent BS Ext warm, symmetric edema GCS<8 BMP Latest Ref Rng & Units 10/07/2016 09/12/2016 09/11/2016  Glucose 65 - 99 mg/dL 144(H) 138(H) 124(H)  BUN 6 - 20 mg/dL 21(H) 21(H) 19  Creatinine 0.44 - 1.00 mg/dL 0.50 0.54 0.46  Sodium 135 - 145 mmol/L 140 138 138  Potassium 3.5 - 5.1 mmol/L 4.2 4.9 4.0  Chloride 101 - 111 mmol/L 101 104 101  CO2 22 - 32 mmol/L 36(H) 29 33(H)  Calcium 8.9 - 10.3 mg/dL 7.8(L) 7.8(L) 7.6(L)   CBC Latest Ref Rng & Units 10/07/2016 09/12/2016 09/11/2016  WBC 3.6 - 11.0 K/uL 19.2(H) 23.0(H) 26.5(H)  Hemoglobin 12.0 - 16.0 g/dL 7.7(L) 6.6(L) 7.6(L)  Hematocrit 35.0 - 47.0 % 24.6(L) 20.6(L) 22.9(L)  Platelets 150 - 440 K/uL 438 518(H) 444(H)   CXR: NNF  IMPRESSION:  83 yo white female with acute resp failure from poor resp effort with effusion in setting of complicated abd surgery and abd abscess.  1) Acute hypoxic respiratory failure - multifactorial 2) Bilateral pleural effusions - S/P L thoracentesis 12/25 - fluid c/w transudate  3) Poor cough mechanics with mucus retention and atelectasis 4) Possible HAP 5) s/p hemicolectomy and hysterectomy with injuries to urinary tract -with abd abscess s/p  jp drain  PLAN/REC: 1) Continue supplemental O2 2) Continue airway hygiene including nebulized BDs 3) Empiric antibiotics per Surgery  4) Mobilize if able 5)will need to discuss goals of care with family Patient niow DNR/DNI  Overall poor prognosis.  Will discuss plan of  Care with family and assess for comfort care measures.   I have personally obtained a history, examined the patient, evaluated Pertinent laboratory and RadioGraphic/imaging results, and  formulated the assessment and plan   The Patient requires high complexity decision making for assessment and support, frequent evaluation and titration of therapies, application of advanced monitoring technologies and extensive interpretation of multiple databases. Critical Care Time devoted to patient care services described in this note is 40* minutes.   Overall, patient is critically ill, prognosis is guarded. high risk for cardiac arrest and death.    Corrin Parker, M.D.  [facility]  Medical Director [place] Director [facility] Cardio-Pulmonary Department      Maretta Bees Patricia Pesa, M.D.  [facility]  Medical Director [place] Director [facility] Cardio-Pulmonary Department

## 2016-09-14 NOTE — Progress Notes (Signed)
Pt is having difficulty verbalizing when she is in pain. Also exhibiting anxiety. 1 mg Ativan ordered.Shortly following administration of ativan pt became obtunded. Provider notified, Ativan changed from 1 mg to .5 mg. Pt resting comfortably, vital signs stable.

## 2016-09-14 NOTE — Progress Notes (Signed)
[facility] Progress Note Patient Name: Katherine Medina DOB: May 19, 1929 MRN: [id number]   Date of Service  09-24-2016  HPI/Events of Note  Patient's on BiPAP with marginal sats. Patient is currenly a DNR/DNI. Spoke with the patient daughter, Trudie Reed, who voices that she and the family desire to move to comfort measures and allow the patient to pass with comfort and dignity.   eICU Interventions  Per family discussions will D/C BiPAP and other life sustaining measures. [place] O2. Family doesn't desire a Morphine IV infusion at this time. However, may require a Morphine IV infusion if she starts to struggle.      Intervention Category Major Interventions: End of life / care limitation discussion  Lysle Dingwall 09/24/16, 5:46 PM

## 2016-09-14 DEATH — deceased

## 2016-09-16 LAB — GLUCOSE, CAPILLARY: Glucose-Capillary: 144 mg/dL — ABNORMAL HIGH (ref 65–99)

## 2016-09-16 LAB — AEROBIC/ANAEROBIC CULTURE W GRAM STAIN (SURGICAL/DEEP WOUND)

## 2016-09-16 LAB — AEROBIC/ANAEROBIC CULTURE (SURGICAL/DEEP WOUND)

## 2017-10-02 IMAGING — US US THORACENTESIS ASP PLEURAL SPACE W/IMG GUIDE
1 series · 4 of 4 positions shown · non-contrast
Comparison: none

INDICATION: 87-year-old female with symptomatic right pleural effusion

[Series 1: us thoracentesis asp pleural space w/img guide · 0.25mm/px · 4 of 4 slices shown]
[im 1/4]
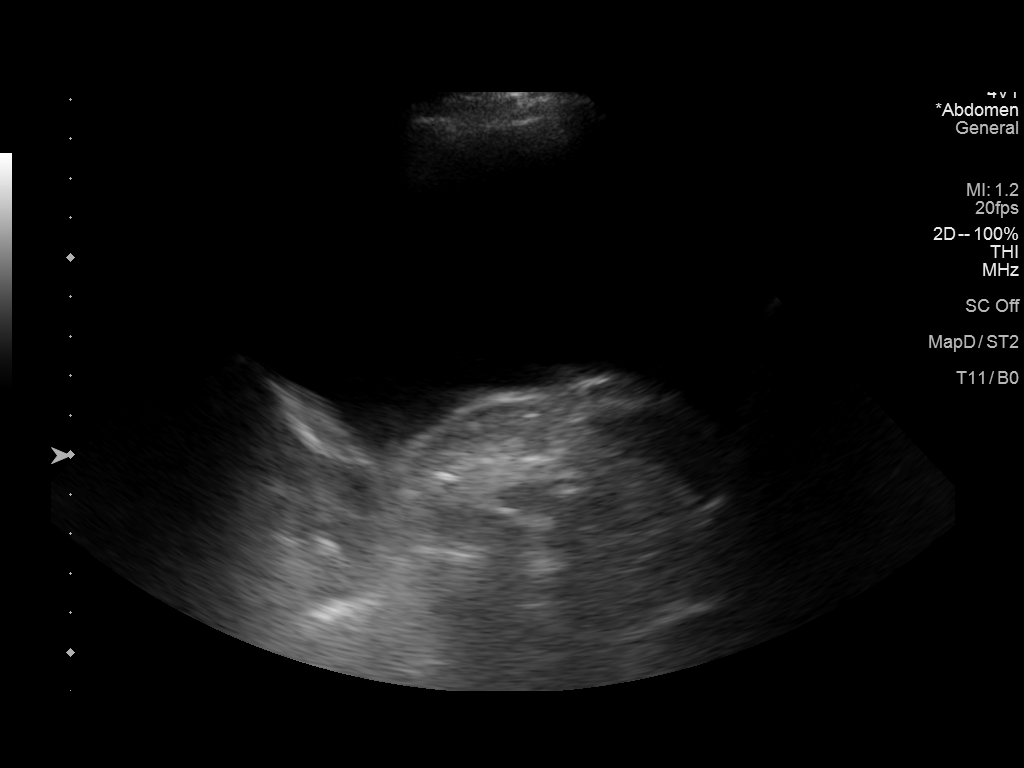
[im 2/4]
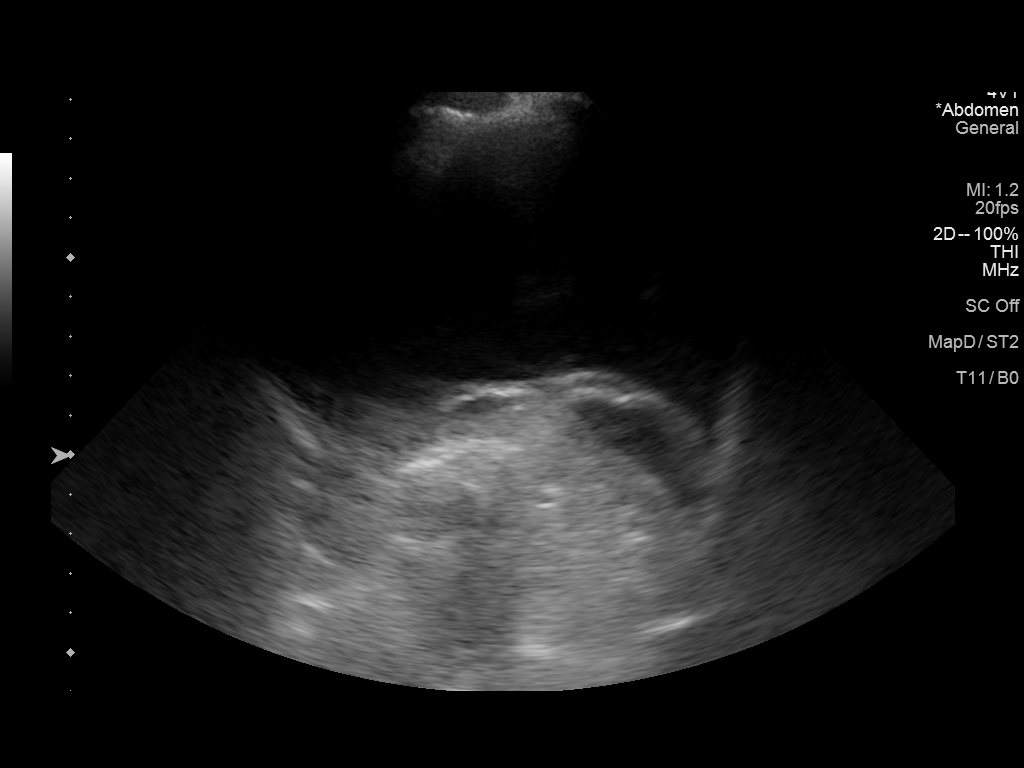
[im 3/4]
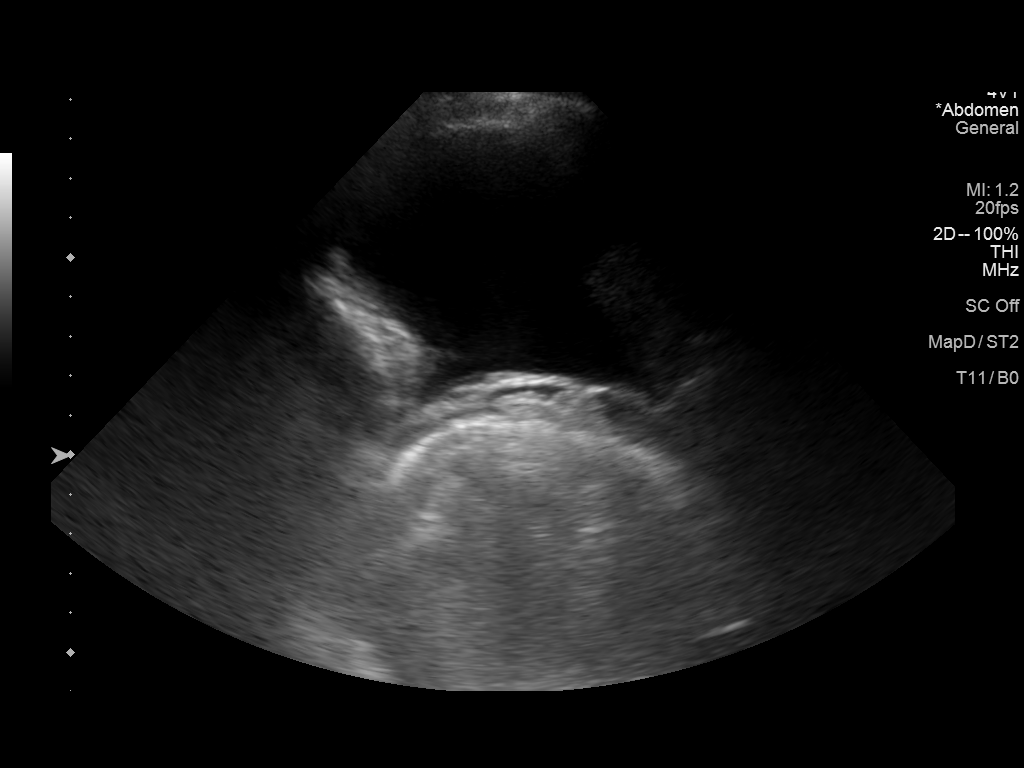
[im 4/4]
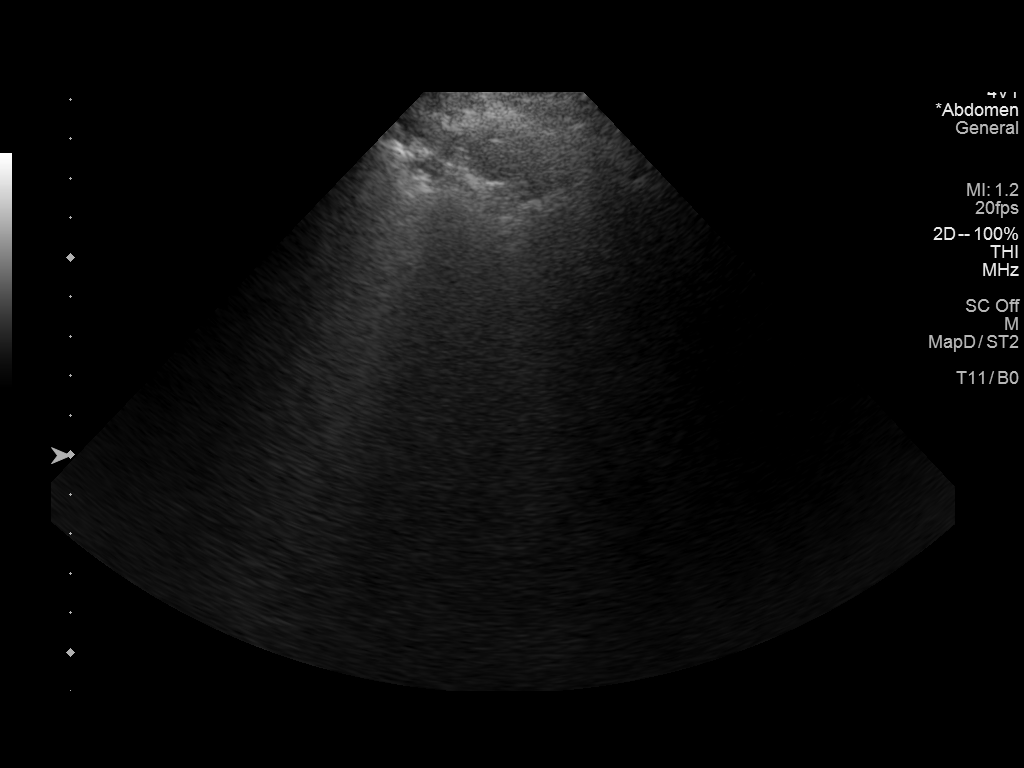

[4 of 4 positions shown; findings below may reference images not displayed]

EXAM:
ULTRASOUND GUIDED RIGHT THORACENTESIS

MEDICATIONS:
None.

COMPLICATIONS:
None immediate.

PROCEDURE:
An ultrasound guided thoracentesis was thoroughly discussed with the
patient and questions answered. The benefits, risks, alternatives
and complications were also discussed. The patient understands and
wishes to proceed with the procedure. Written consent was obtained.

Ultrasound was performed to localize and mark an adequate pocket of
fluid in the right chest. The area was then prepped and draped in
the normal sterile fashion. 1% Lidocaine was used for local
anesthesia. Under ultrasound guidance a 6 Fr Safe-T-Centesis
catheter was introduced. Thoracentesis was performed. The catheter
was removed and a dressing applied.
FINDINGS: A total of approximately 900 mL of amber colored pleural fluid was
removed. Samples were sent to the laboratory as requested by the
clinical team.
IMPRESSION: Successful ultrasound guided right thoracentesis yielding 900 mL of
pleural fluid.

## 2017-10-02 IMAGING — CT CT ABD-PELV W/ CM
2 of 5 series · 15 of 46 positions shown, 17 images · IV contrast (APPLIED)
Comparison: CT abdomen pelvis 08/30/2016.

CLINICAL DATA: Patient status post ureteral reimplantation after
transsection. Recent percutaneous drainage placement within the
pelvis.

EXAM:
CT ABDOMEN AND PELVIS WITH CONTRAST
TECHNIQUE: Multidetector CT imaging of the abdomen and pelvis was performed
using the standard protocol following bolus administration of
intravenous contrast.
CONTRAST:  100 cc [id number]

[Series 2: axial st · axial · 0.91mm/px · z∈[-1280,-865]mm · 12 of 93 slices shown, 14 images]
[im 5/93  soft-tissue]
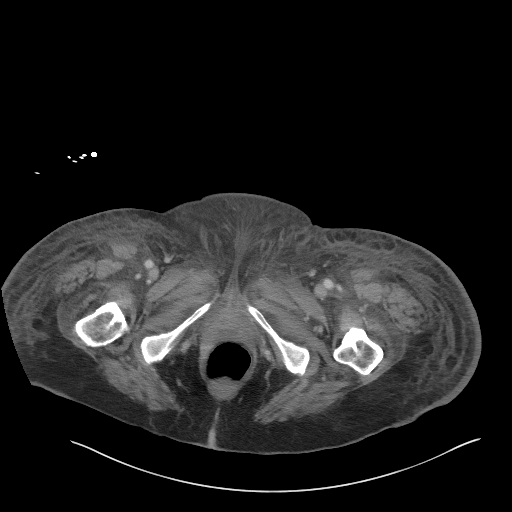
[im 5/93  bone]
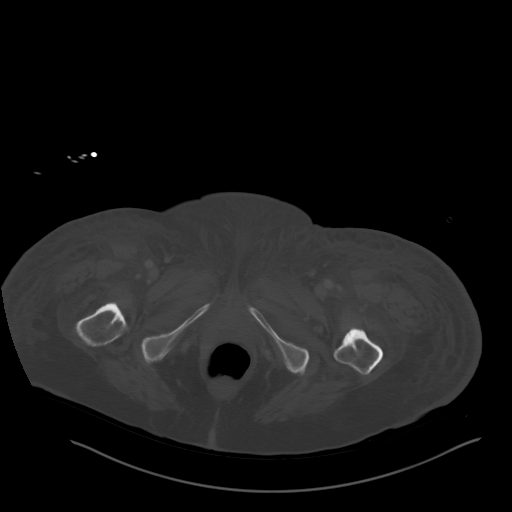
[im 15/93  soft-tissue]
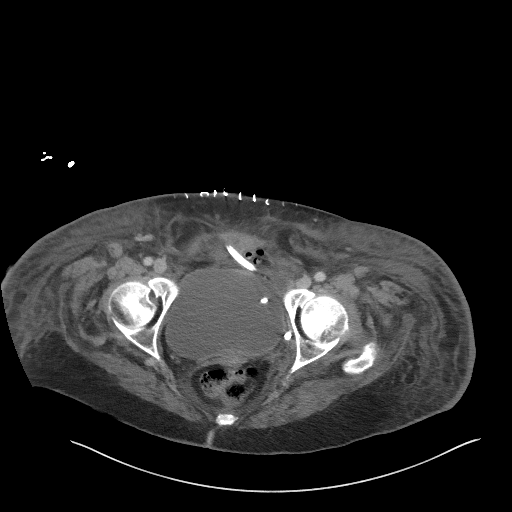
[im 20/93  soft-tissue]
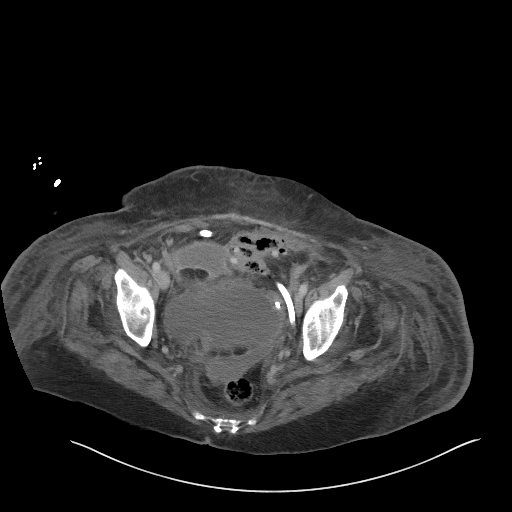
[im 30/93  soft-tissue]
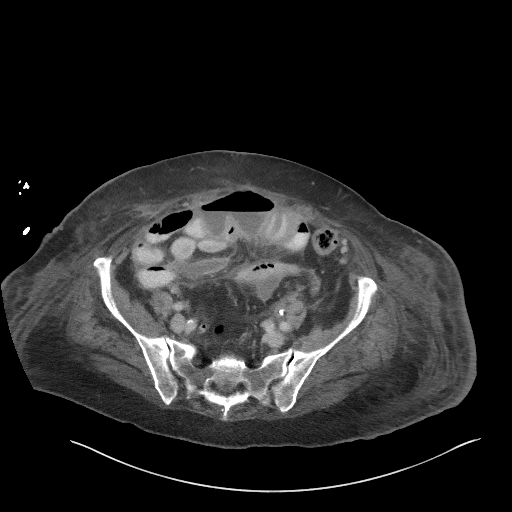
[im 34/93  soft-tissue]
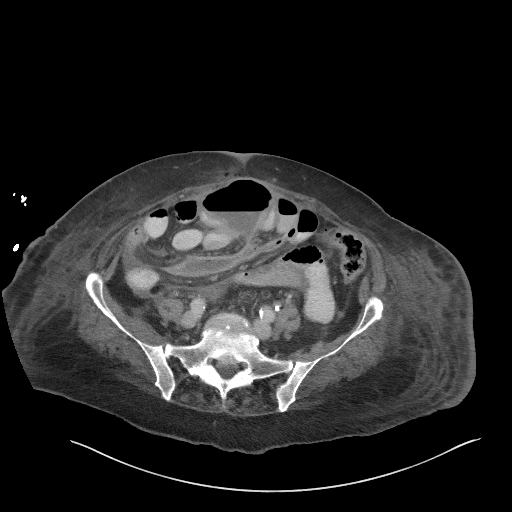
[im 44/93  soft-tissue]
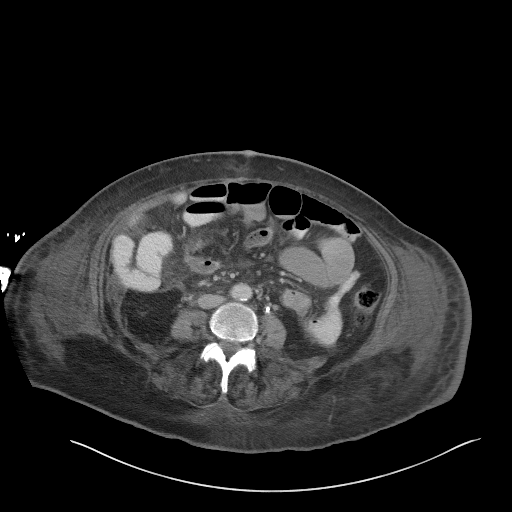
[im 49/93  soft-tissue]
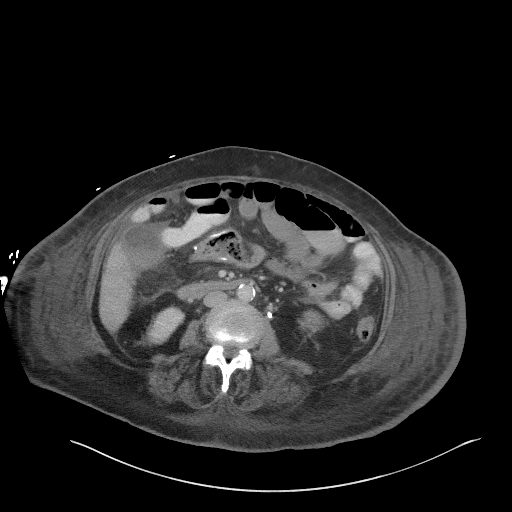
[im 59/93  soft-tissue]
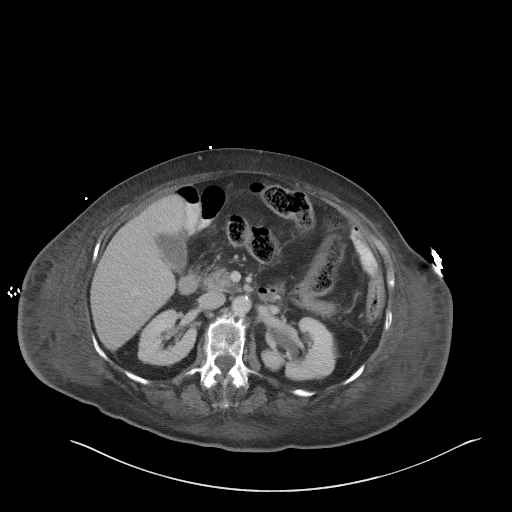
[im 63/93  soft-tissue]
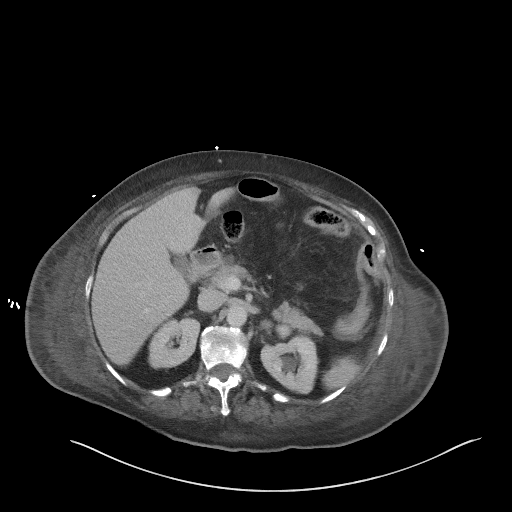
[im 63/93  bone]
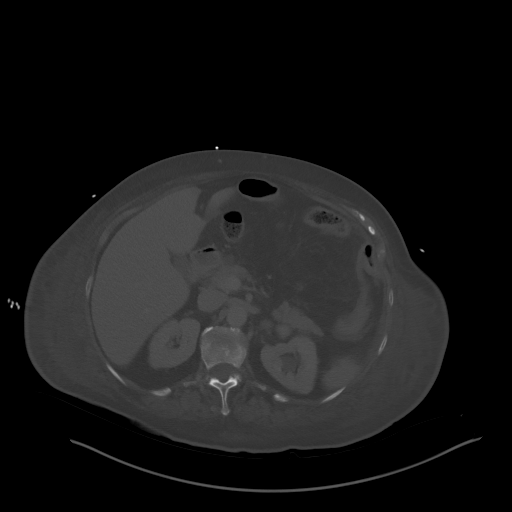
[im 73/93  soft-tissue]
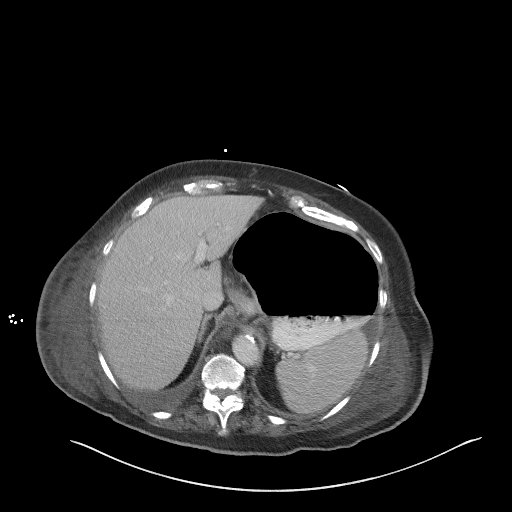
[im 78/93  soft-tissue]
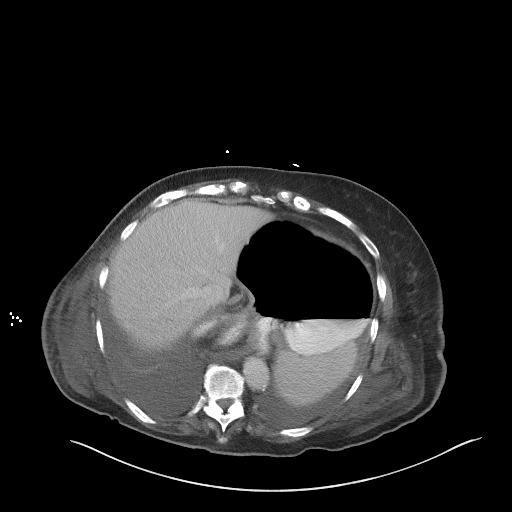
[im 88/93  soft-tissue]
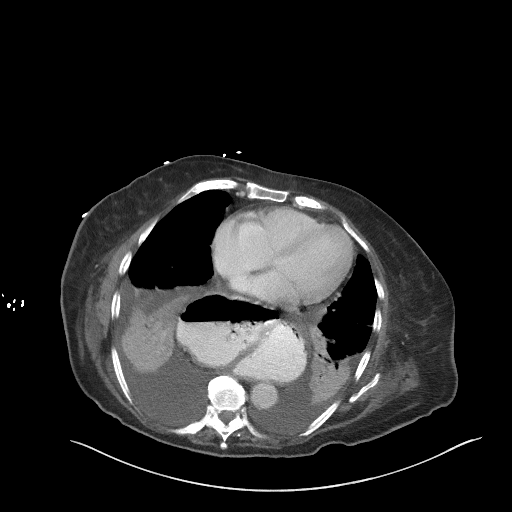

[Series 5: coronal st · coronal · 0.81mm/px · 3 of 101 slices shown]
[im 34/101  soft-tissue]
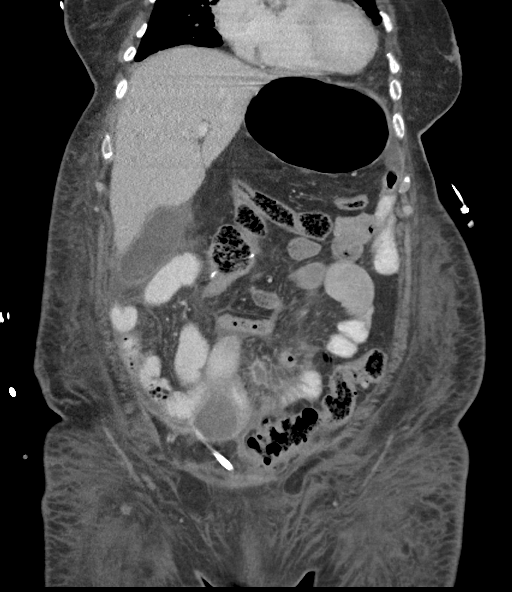
[im 45/101  soft-tissue]
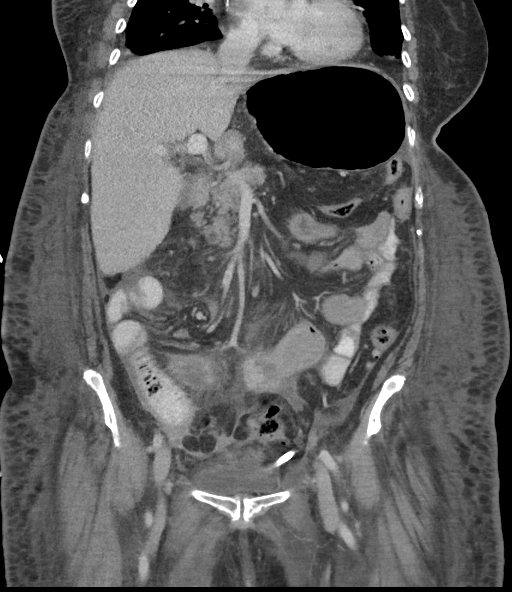
[im 56/101  soft-tissue]
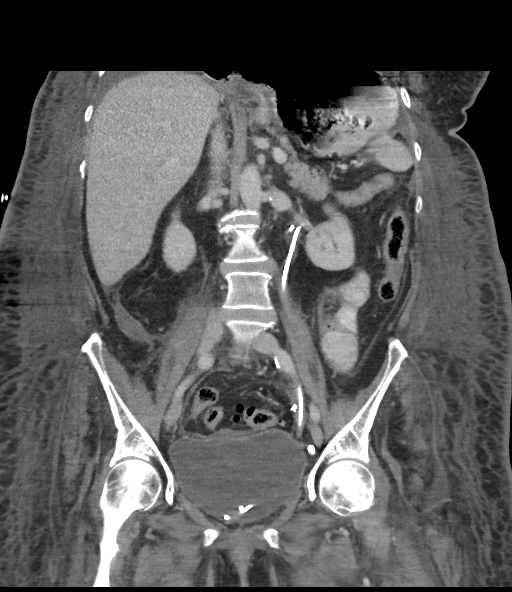

[15 of 46 positions shown; findings below may reference images not displayed]

FINDINGS: Lower chest: Normal heart size. Large hiatal hernia. Moderate
layering bilateral pleural effusions. Consolidation within the lower
lungs bilaterally.

Hepatobiliary: Liver is normal in size and contour. No focal hepatic
lesion is identified. Layering sludge within the gallbladder lumen.

Pancreas: Unremarkable

Spleen: Unremarkable

Adrenals/Urinary Tract: Normal adrenal glands. Kidneys enhance
symmetrically with contrast. Left-sided double-J nephroureteral
stent is in place. Urinary bladder is mildly distended. There is
mild left hydronephrosis.

Stomach/Bowel: Large hiatal hernia. Postsurgical changes compatible
with ascending colectomy. Gas and fluid collections extend adjacent
to the anastomosis involving the colon (image 43; series 5).
Anastomotic breakdown is not excluded. No evidence for bowel
obstruction. Sigmoid colonic diverticulosis. No CT evidence for
acute diverticulitis.

Vascular/Lymphatic: Normal caliber abdominal aorta. Peripheral
calcified atherosclerotic plaque. No retroperitoneal
lymphadenopathy.

Reproductive: Status post hysterectomy.

Other: Interval placement of percutaneous drainage catheter
terminating in the left hemipelvis. Near complete resolution of
previously described pelvic fluid collection. There are persistent
peripherally enhancing gas and fluid collections insinuated amongst
the small bowel predominately within the right lower quadrant and
anterior abdomen measuring up to 8.1 x 3.0 cm. This communicates
more superiorly and anteriorly with a fluid and gas containing
collection measuring 6.7 x 6.3 cm (image 61; series 2).

Musculoskeletal: Anasarca. Lower thoracic and lumbar spine
degenerative changes. No aggressive or acute appearing osseous
lesions.
IMPRESSION: Interval placement of drainage catheter terminating within the left
hemipelvis with significant interval decrease in size of previously
described pelvic fluid collection.

Additionally within the central abdomen and right lower abdomen
there is a large rim enhancing gas and fluid containing collection
concerning for abscess. This fluid collection extends to the level
of the anastomosis involving the colon. The possibility of
anastomotic breakdown as a causative etiology is not excluded.

Left-sided double-J nephroureteral stent in place. There is mild
left hydronephrosis.

Moderate bilateral layering pleural effusions and underlying
pulmonary consolidation.

Large hiatal hernia.

Anasarca.

Aortic atherosclerosis.

These results will be called to the ordering clinician or
representative by the Radiologist Assistant, and communication
documented in the PACS or zVision Dashboard.

## 2017-10-03 IMAGING — DX DG CHEST 1V PORT
1 series · 1 of 1 positions shown · non-contrast
Comparison: Radiograph September 11, 2016.

CLINICAL DATA: PICC placement.

EXAM:
PORTABLE CHEST 1 VIEW

[chest ap]
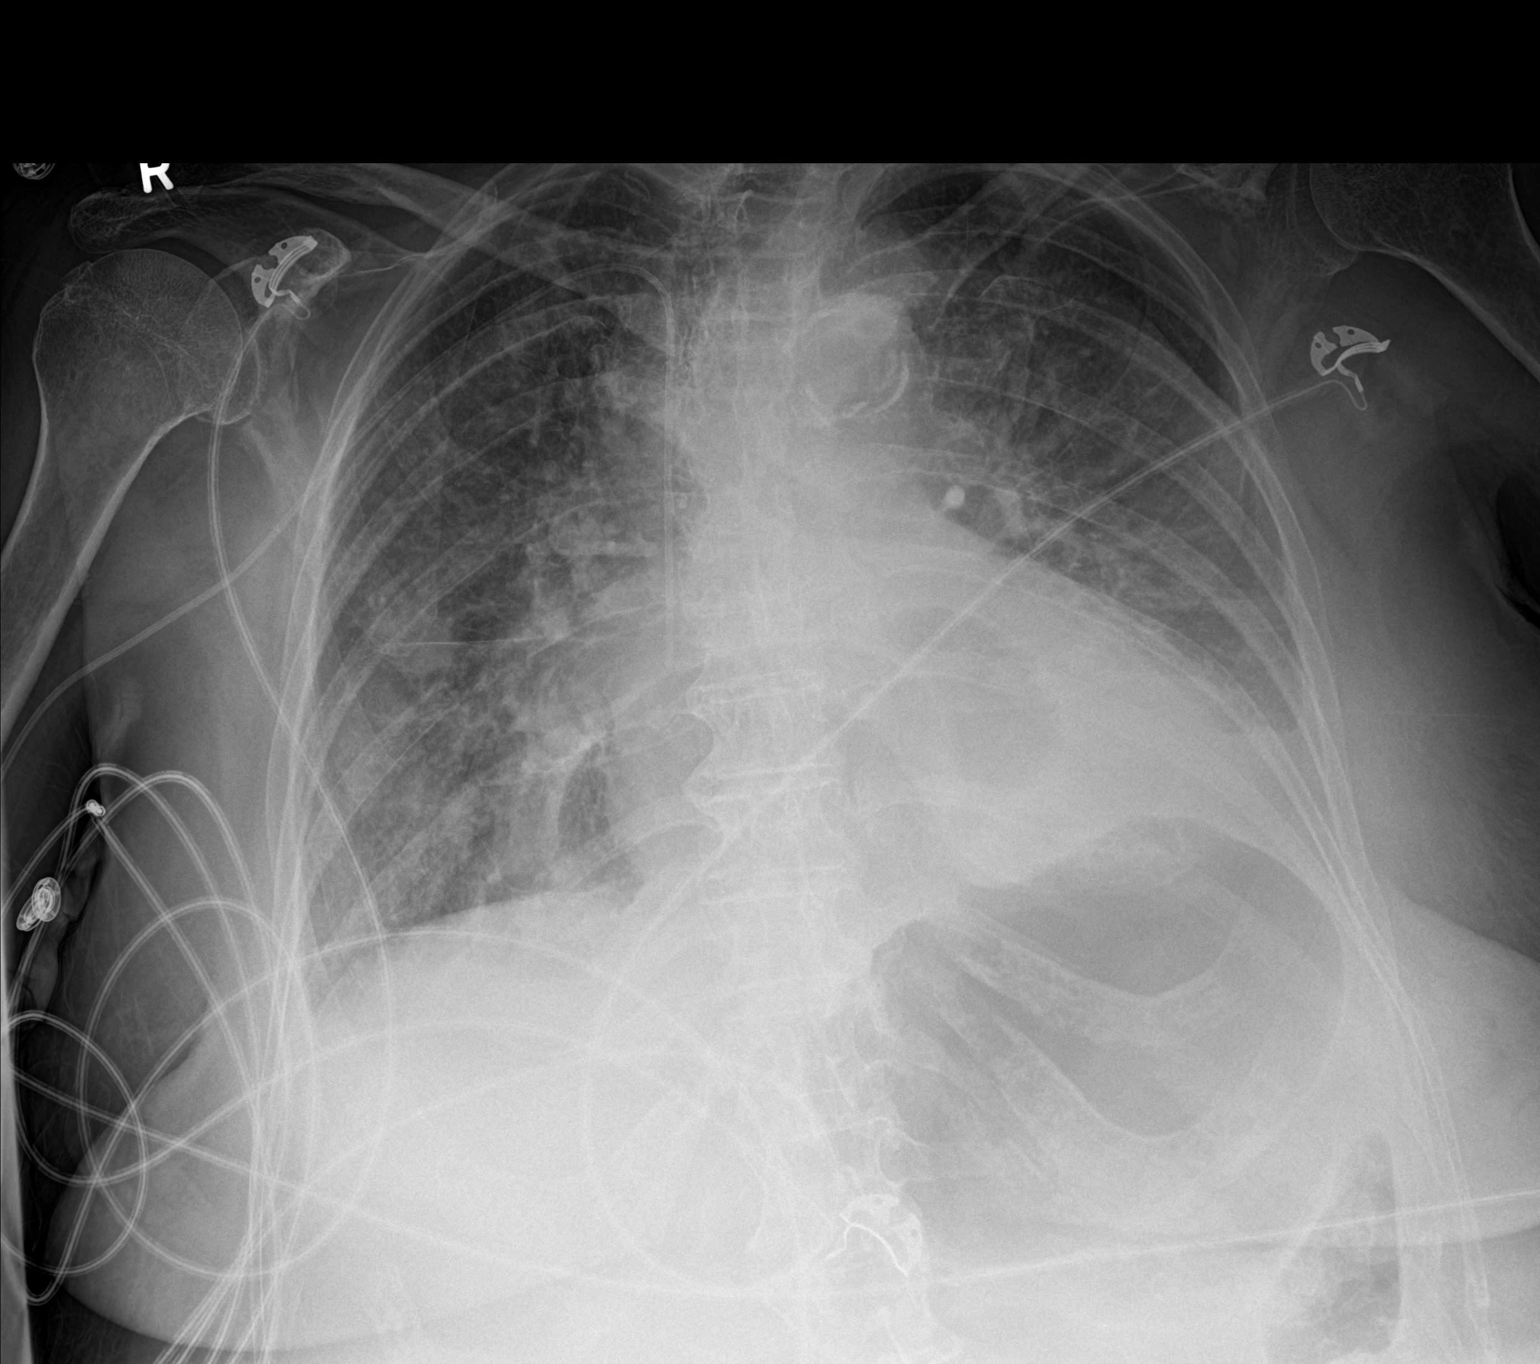

[1 of 1 positions shown; findings below may reference images not displayed]

FINDINGS: Stable cardiomegaly. Atherosclerosis of thoracic aorta is noted.
Stable large hiatal hernia. Stable bilateral perihilar and basilar
opacities are noted concerning for edema. Stable mild left pleural
effusion is noted. No pneumothorax is noted. Right-sided PICC line
is unchanged in position, with distal tip in expected position of
cavoatrial junction.
IMPRESSION: Stable position of right-sided PICC line. Aortic atherosclerosis.
Stable large hiatal hernia. Stable bilateral lung opacities as
described above.
